# Patient Record
Sex: Male | Born: 1940
Health system: Southern US, Community
[De-identification: ages and names within clinical notes are randomized; demographics above are authoritative.]

## PROBLEM LIST (undated history)

## (undated) DIAGNOSIS — I10 Essential (primary) hypertension: Secondary | ICD-10-CM

## (undated) DIAGNOSIS — G20 Parkinson's disease: Secondary | ICD-10-CM

## (undated) DIAGNOSIS — M1990 Unspecified osteoarthritis, unspecified site: Secondary | ICD-10-CM

## (undated) DIAGNOSIS — E785 Hyperlipidemia, unspecified: Secondary | ICD-10-CM

## (undated) DIAGNOSIS — N529 Male erectile dysfunction, unspecified: Secondary | ICD-10-CM

## (undated) DIAGNOSIS — G20A1 Parkinson's disease without dyskinesia, without mention of fluctuations: Secondary | ICD-10-CM

## (undated) HISTORY — DX: Male erectile dysfunction, unspecified: N52.9

## (undated) HISTORY — DX: Hyperlipidemia, unspecified: E78.5

## (undated) HISTORY — DX: Parkinson's disease: G20

## (undated) HISTORY — DX: Parkinson's disease without dyskinesia, without mention of fluctuations: G20.A1

## (undated) HISTORY — PX: CATARACT EXTRACTION W/ INTRAOCULAR LENS IMPLANT: SHX1309

## (undated) HISTORY — DX: Unspecified osteoarthritis, unspecified site: M19.90

---

## 2004-06-10 HISTORY — PX: FLEXIBLE SIGMOIDOSCOPY: SHX1649

## 2004-06-20 ENCOUNTER — Encounter: Payer: Self-pay | Admitting: Internal Medicine

## 2005-04-04 ENCOUNTER — Ambulatory Visit: Payer: Self-pay | Admitting: Internal Medicine

## 2005-04-10 ENCOUNTER — Ambulatory Visit: Payer: Self-pay | Admitting: Internal Medicine

## 2005-04-28 ENCOUNTER — Ambulatory Visit: Payer: Self-pay | Admitting: Internal Medicine

## 2005-05-20 ENCOUNTER — Ambulatory Visit: Payer: Self-pay | Admitting: Internal Medicine

## 2006-04-30 ENCOUNTER — Ambulatory Visit: Payer: Self-pay | Admitting: Internal Medicine

## 2006-05-10 LAB — FECAL OCCULT BLOOD, GUAIAC: Fecal Occult Blood: NEGATIVE

## 2006-05-19 ENCOUNTER — Ambulatory Visit: Payer: Self-pay | Admitting: Internal Medicine

## 2007-05-17 DIAGNOSIS — F528 Other sexual dysfunction not due to a substance or known physiological condition: Secondary | ICD-10-CM | POA: Insufficient documentation

## 2007-05-17 DIAGNOSIS — M1990 Unspecified osteoarthritis, unspecified site: Secondary | ICD-10-CM | POA: Insufficient documentation

## 2007-05-17 DIAGNOSIS — E785 Hyperlipidemia, unspecified: Secondary | ICD-10-CM | POA: Insufficient documentation

## 2007-05-25 ENCOUNTER — Ambulatory Visit: Payer: Self-pay | Admitting: Internal Medicine

## 2007-05-26 LAB — CONVERTED CEMR LAB
Cholesterol: 203 mg/dL (ref 0–200)
HDL: 32.3 mg/dL — ABNORMAL LOW (ref >39.0)
Triglycerides: 151 mg/dL — ABNORMAL HIGH (ref 0–149)

## 2007-06-15 ENCOUNTER — Ambulatory Visit: Payer: Self-pay | Admitting: Internal Medicine

## 2007-06-21 ENCOUNTER — Encounter (INDEPENDENT_AMBULATORY_CARE_PROVIDER_SITE_OTHER): Payer: Self-pay | Admitting: *Deleted

## 2008-03-12 ENCOUNTER — Emergency Department: Payer: Self-pay | Admitting: Emergency Medicine

## 2008-04-28 ENCOUNTER — Ambulatory Visit: Payer: Self-pay | Admitting: Internal Medicine

## 2008-07-26 ENCOUNTER — Encounter (INDEPENDENT_AMBULATORY_CARE_PROVIDER_SITE_OTHER): Payer: Self-pay | Admitting: *Deleted

## 2008-07-26 ENCOUNTER — Ambulatory Visit: Payer: Self-pay | Admitting: Internal Medicine

## 2008-07-27 LAB — CONVERTED CEMR LAB
Glucose, Bld: 94 mg/dL (ref 70–99)
PSA: 0.42 ng/mL (ref 0.10–4.00)

## 2008-07-31 ENCOUNTER — Ambulatory Visit: Payer: Self-pay | Admitting: Internal Medicine

## 2008-08-03 LAB — CONVERTED CEMR LAB: Fecal Occult Bld: POSITIVE — AB

## 2008-08-17 ENCOUNTER — Ambulatory Visit: Payer: Self-pay | Admitting: Internal Medicine

## 2008-08-17 LAB — CONVERTED CEMR LAB
OCCULT 1: NEGATIVE
OCCULT 2: NEGATIVE
OCCULT 3: NEGATIVE

## 2008-08-18 ENCOUNTER — Telehealth: Payer: Self-pay | Admitting: Internal Medicine

## 2008-09-14 ENCOUNTER — Ambulatory Visit: Payer: Self-pay | Admitting: Gastroenterology

## 2008-09-18 ENCOUNTER — Telehealth: Payer: Self-pay | Admitting: Internal Medicine

## 2008-09-25 ENCOUNTER — Telehealth: Payer: Self-pay | Admitting: Gastroenterology

## 2008-09-27 ENCOUNTER — Ambulatory Visit: Payer: Self-pay | Admitting: Gastroenterology

## 2008-09-27 ENCOUNTER — Encounter: Payer: Self-pay | Admitting: Gastroenterology

## 2008-09-27 LAB — HM COLONOSCOPY

## 2008-10-01 ENCOUNTER — Encounter: Payer: Self-pay | Admitting: Gastroenterology

## 2009-10-15 ENCOUNTER — Ambulatory Visit: Payer: Self-pay | Admitting: Internal Medicine

## 2009-10-15 DIAGNOSIS — R011 Cardiac murmur, unspecified: Secondary | ICD-10-CM | POA: Insufficient documentation

## 2009-10-16 LAB — CONVERTED CEMR LAB
Albumin: 3.9 g/dL (ref 3.5–5.2)
Alkaline Phosphatase: 50 units/L (ref 39–117)
BUN: 16 mg/dL (ref 6–23)
Basophils Absolute: 0.1 10*3/uL (ref 0.0–0.1)
Creatinine, Ser: 1.1 mg/dL (ref 0.4–1.5)
Eosinophils Absolute: 0.5 10*3/uL (ref 0.0–0.7)
Glucose, Bld: 87 mg/dL (ref 70–99)
Hemoglobin: 14 g/dL (ref 13.0–17.0)
Lymphocytes Relative: 21.9 % (ref 12.0–46.0)
MCHC: 33.9 g/dL (ref 30.0–36.0)
Neutro Abs: 4.4 10*3/uL (ref 1.4–7.7)
Neutrophils Relative %: 65.1 % (ref 43.0–77.0)
Phosphorus: 3.9 mg/dL (ref 2.3–4.6)
Platelets: 213 10*3/uL (ref 150.0–400.0)
RDW: 11.8 % (ref 11.5–14.6)
TSH: 2.59 microintl units/mL (ref 0.35–5.50)
Total Bilirubin: 0.6 mg/dL (ref 0.3–1.2)

## 2009-10-19 ENCOUNTER — Encounter: Payer: Self-pay | Admitting: Internal Medicine

## 2010-05-17 ENCOUNTER — Ambulatory Visit: Payer: Self-pay | Admitting: Internal Medicine

## 2010-05-17 DIAGNOSIS — R0989 Other specified symptoms and signs involving the circulatory and respiratory systems: Secondary | ICD-10-CM

## 2010-05-17 DIAGNOSIS — S335XXA Sprain of ligaments of lumbar spine, initial encounter: Secondary | ICD-10-CM | POA: Insufficient documentation

## 2010-05-17 DIAGNOSIS — R0609 Other forms of dyspnea: Secondary | ICD-10-CM | POA: Insufficient documentation

## 2010-06-04 ENCOUNTER — Ambulatory Visit (HOSPITAL_COMMUNITY): Admission: RE | Admit: 2010-06-04 | Discharge: 2010-06-04 | Payer: Self-pay | Admitting: Internal Medicine

## 2010-06-04 ENCOUNTER — Ambulatory Visit: Payer: Self-pay

## 2010-06-04 ENCOUNTER — Encounter: Payer: Self-pay | Admitting: Internal Medicine

## 2010-06-04 ENCOUNTER — Ambulatory Visit: Payer: Self-pay | Admitting: Cardiovascular Disease

## 2010-10-16 ENCOUNTER — Ambulatory Visit: Payer: Self-pay | Admitting: Internal Medicine

## 2010-10-16 DIAGNOSIS — R413 Other amnesia: Secondary | ICD-10-CM | POA: Insufficient documentation

## 2010-10-17 LAB — CONVERTED CEMR LAB
Glucose, Bld: 72 mg/dL (ref 70–99)
Vitamin B-12: 496 pg/mL (ref 211–911)

## 2010-12-04 ENCOUNTER — Ambulatory Visit
Admission: RE | Admit: 2010-12-04 | Discharge: 2010-12-04 | Payer: Self-pay | Source: Home / Self Care | Attending: Internal Medicine | Admitting: Internal Medicine

## 2010-12-04 DIAGNOSIS — R42 Dizziness and giddiness: Secondary | ICD-10-CM | POA: Insufficient documentation

## 2010-12-11 NOTE — Assessment & Plan Note (Signed)
Summary: Wellness annual /JRR   Vital Signs:  Patient profile:   70 year old male Height:      68.5 inches Weight:      173 pounds BMI:     26.02 Temp:     97.8 degrees F oral Pulse rate:   68 / minute Pulse rhythm:   regular BP sitting:   138 / 80  (left arm) Cuff size:   large  Vitals Entered By: Mervin Hack CMA Duncan Dull) (October 16, 2010 9:31 AM) CC: adult physical   History of Present Illness: Reviewed paperwork for wellness exam  still some hearing problems but no change still with mild short term memory issues --which have not progressed over the past year  Occ gets some orthostatic dizziness also gets the sensation if he turns in bed Gets "heaviness feeling" in head--- it does dissapate over time No tinnitus Has been going on again for  ~3 months intermittently similar to past episode of vestibular problems  Has not really picked up on exercise does walk about 1 hour three times per week  Has only used the carisprodol occ--like after getting leaves up goes every 6 weeks to chiropractor  Still working on his scholarly work on [employer] history forgets names easier  Allergies: No Known Drug Allergies  Past History:  Past medical, surgical, family and social histories (including risk factors) reviewed for relevance to current acute and chronic problems.  Past Medical History: Reviewed history from 05/17/2007 and no changes required. Hyperlipidemia Degenerative joint disease Erectile dysfunction  Past Surgical History: Reviewed history from 07/26/2008 and no changes required. 8/05    Flex Sig.  Family History: Reviewed history from 05/17/2007 and no changes required. Dad died @74  lung cancer (smoker) Mom died @94  No CAD or HTN Pat uncle with DM  Social History: Reviewed history from 07/26/2008 and no changes required. 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 Eyes:  Denies double vision and vision loss-1 eye. ENT:  Complains of decreased hearing; denies ringing in ears; teeth okay--gets cleaning every 3 months. CV:  Complains of lightheadness and shortness of breath with exertion; denies chest pain or discomfort, difficulty breathing at night, difficulty breathing while lying down, and fainting; stable DOE. Resp:  Complains of cough; denies shortness of breath; occ cough. GI:  Denies abdominal pain, bloody stools, change in bowel habits, dark tarry stools, indigestion, nausea, and vomiting. GU:  Complains of erectile dysfunction, nocturia, urinary frequency, and urinary hesitancy; denies incontinence; viagra not as effective lately. MS:  Complains of low back pain and muscle aches; denies joint pain and joint swelling; occ low back pain and spasm does have some stiffness in fingers. Derm:  Complains of dryness; denies lesion(s) and rash. Neuro:  Complains of tremors; denies headaches, numbness, tingling, and weakness; fingers don't work as well as in past--stiffness (with typing for example). Psych:  Denies anxiety and depression. Endo:  Denies excessive thirst. Heme:  Denies abnormal bruising and enlarge lymph nodes. Allergy:  Complains of sneezing; may have cat and environmental symptoms.  Physical Exam  General:  alert and normal appearance.   Eyes:  pupils equal, pupils round, and pupils reactive to light.   Ears:  R ear normal and L ear normal.  Mouth:  no erythema, no exudates, and no lesions.   Neck:  supple, no masses, no thyromegaly, no carotid bruits, and no cervical lymphadenopathy.   Lungs:  normal respiratory effort, no intercostal retractions, no accessory muscle use, and normal breath sounds.   Heart:  normal rate, regular  rhythm, no murmur, and no gallop.   Abdomen:  soft, non-tender, and no masses.   Prostate:  deferred after discussion Msk:  no joint tenderness and no joint swelling.   Pulses:  2+ in feet Extremities:  no edema Neurologic:  alert & oriented X3, strength normal in all extremities, and gait normal.   Skin:  no rashes and no suspicious lesions.   Axillary Nodes:  No palpable lymphadenopathy Psych:  normally interactive, good eye contact, not anxious appearing, and not depressed appearing.     Impression & Recommendations:  Problem # 1:  HEALTH MAINTENANCE EXAM (ICD-V70.0) Assessment Comment Only seems healthy discussed fitness will check PSA till 106 after discussion  Problem # 2:  MEMORY LOSS (ICD-780.93) Assessment: Comment Only  not progressive doesn't seem to be early dementia will just check B12  Orders: Venipuncture (04540) TLB-Glucose, QUANT (82947-GLU) TLB-B12, Serum-Total ONLY (98119-J47)  Complete Medication List: 1)  Triamcinolone Acetonide 0.1 % Crea (Triamcinolone acetonide) .... Apply twice a day  as needed for itchy areas 2)  Carisoprodol 350 Mg Tabs (Carisoprodol) .... 1/2 -1 tab by mouth three times a day as needed for back spasm 3)  Viagra 100 Mg Tabs (Sildenafil citrate) .... 1/2 tab as needed 4)  Adult Aspirin Ec Low Strength 81 Mg Tbec (Aspirin) .Marland Kitchen.. 1 by mouth daily 5)  One-daily Multivitamins Tabs (Multiple vitamin) .... Once daily 6)  Glucosamine-chondroitin 1500-1200 Mg/51ml Liqd (Glucosamine-chondroitin) .... Once daily 7)  Docusate Sodium 100 Mg Caps (Docusate sodium) .... Use daily as needed  Other Orders: TLB-PSA (Prostate Specific Antigen) (84153-PSA)  Patient Instructions: 1)  Please schedule a follow-up appointment in 1 year.  Prescriptions: CARISOPRODOL 350 MG TABS (CARISOPRODOL) 1/2 -1 tab by mouth three times a day as needed for back spasm  #30 x 1   Entered by:   Mervin Hack CMA (AAMA)   Authorized by:   Cindee Salt MD    Signed by:   Mervin Hack CMA (AAMA) on 10/16/2010   Method used:   Electronically to        [facility]. [address] [phone]* (retail)       [address] [place], [place]  [address]       Ph: [phone]       Fax: [phone]   RxID:   [id number] TRIAMCINOLONE ACETONIDE 0.1 %  CREA (TRIAMCINOLONE ACETONIDE) apply twice a day  as needed for itchy areas  #30 x 6   Entered by:   Mervin Hack CMA (AAMA)   Authorized by:   Cindee Salt MD   Signed by:   Mervin Hack CMA (AAMA) on 10/16/2010   Method used:   Electronically to        [facility]. [address] [phone]* (retail)       [address] [place], [place]  [address]       Ph: [phone]       Fax: [phone]   RxID:   [id number] VIAGRA 100 MG  TABS (SILDENAFIL CITRATE) 1/2 tab as needed  #10 x 12   Entered by:   Mervin Hack CMA (AAMA)  Authorized by:   Cindee Salt MD   Signed by:   Mervin Hack CMA (AAMA) on 10/16/2010   Method used:   Electronically to        [facility]. [address] [phone]* (retail)       [address] [place], [place]  [address]       Ph: [phone]       Fax: [phone]   RxID:   [id number]    Orders Added: 1)  Est. Patient 65& > [99397] 2)  TLB-PSA (Prostate Specific Antigen) [84153-PSA] 3)  Venipuncture [42595] 4)  TLB-Glucose, QUANT [82947-GLU] 5)  TLB-B12, Serum-Total ONLY [82607-B12]    Current Allergies (reviewed today): No known allergies

## 2010-12-11 NOTE — Assessment & Plan Note (Signed)
Summary: back pain/alc   Vital Signs:  Patient profile:   70 year old male Weight:      171 pounds Temp:     98.5 degrees F oral Pulse rate:   76 / minute Pulse rhythm:   regular Resp:     14 per minute BP sitting:   140 / 94  (left arm) Cuff size:   regular  Vitals Entered By: Lowella Petties CMA (May 17, 2010 11:42 AM) CC: Right mid to lower back pain   History of Present Illness: Having reoccurrence of back problem that hasn't bothered him in some time Ruptured disc in past--20 years ago had PT then for spasm etc will have periodic pain since then  wife's carisprodol usually helps  really worsened yesterday mild symptoms for about 1 week--just like his usual soreness after bad bed at scoup camp and then using weed eater had to stay in bed last night took one of wife's carisprodol--seemed to ease things up  has had some shooting pains down left leg only yesterday and intermittent no weakness  has tried some tylenol but carisprodol works better  Ongoing DOE worsened lately no chest pain, arm or neck pain but gets dyspneic going up steps or doing yard work no palpitations  Allergies: No Known Drug Allergies  Past History:  Past medical, surgical, family and social histories (including risk factors) reviewed for relevance to current acute and chronic problems.  Past Medical History: Reviewed history from 05/17/2007 and no changes required. Hyperlipidemia Degenerative joint disease Erectile dysfunction  Past Surgical History: Reviewed history from 07/26/2008 and no changes required. 8/05    Flex Sig.  Family History: Reviewed history from 05/17/2007 and no changes required. Dad died @74  lung cancer (smoker) Mom died @94  No CAD or HTN Pat uncle with DM  Social History: Reviewed history from 07/26/2008 and no changes required. 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   Neck:  supple, no masses, no thyromegaly, no carotid bruits, and no cervical lymphadenopathy.   Lungs:  normal respiratory effort, no intercostal retractions, no accessory muscle use, normal breath sounds, no crackles, and no wheezes.   Heart:  normal rate, regular rhythm, no murmur, and no gallop.   Msk:  no spine tenderness hip flexors are stiff but SLR negative limited back flexion mild spasm and tenderness in lateral left lumbar area Extremities:  no edema Neurologic:  strength normal in all extremities, gait normal, and DTRs symmetrical and normal.   Additional Exam:  spirometry normal unable to get EKG--technical problem with machine   Impression & Recommendations:  Problem # 1:  LUMBAR SPRAIN AND STRAIN (ICD-847.2) Assessment New seems muscular will go ahead and give him carisprodol for his use  Problem # 2:  DYSPNEA/SHORTNESS OF BREATH (ICD-786.09) Assessment: New  goes back a year but now more pressing will go ahead and set up stress echo since coronary ischemia is a definite possiblity  Orders: Spirometry w/Graph (94010) Echo Referral (Echo)  Complete Medication List: 1)  Viagra 100  Mg Tabs (Sildenafil citrate) .... 1/2 tab as needed 2)  Adult Aspirin Ec Low Strength 81 Mg Tbec (Aspirin) .Marland Kitchen.. 1 by mouth daily 3)  One-daily Multivitamins Tabs (Multiple vitamin) .... Once daily 4)  Glucosamine-chondroitin 1500-1200 Mg/12ml Liqd (Glucosamine-chondroitin) .... Once daily 5)  Triamcinolone Acetonide 0.1 % Crea (Triamcinolone acetonide) .... Apply twice a day  as needed for itchy areas 6)   Docusate Sodium 100 Mg Caps (Docusate sodium) .... Use daily as needed 7)  Carisoprodol 350 Mg Tabs (Carisoprodol) .... 1/2 -1 tab by mouth three times a day as needed for back spasm  Patient Instructions: 1)  Please schedule physical in about 6 months 2)  Please set up stress echocardiogram Prescriptions: CARISOPRODOL 350 MG TABS (CARISOPRODOL) 1/2 -1 tab by mouth three times a day as needed for back spasm  #30 x 1   Entered and Authorized by:   Cindee Salt MD   Signed by:   Cindee Salt MD on 05/17/2010   Method used:   Electronically to        [facility] [address] [phone]* (retail)       [address] [place], [place]  [address]       Ph: [phone]       Fax: [phone]   RxID:   [id number]   Prior Medications (reviewed today): VIAGRA 100 MG  TABS (SILDENAFIL CITRATE) 1/2 tab as needed ADULT ASPIRIN EC LOW STRENGTH 81 MG  TBEC (ASPIRIN) 1 by mouth daily ONE-DAILY MULTIVITAMINS   TABS (MULTIPLE VITAMIN) once daily GLUCOSAMINE-CHONDROITIN 1500-1200 MG/30ML  LIQD (GLUCOSAMINE-CHONDROITIN) once daily TRIAMCINOLONE ACETONIDE 0.1 %  CREA (TRIAMCINOLONE ACETONIDE) apply twice a day  as needed for itchy areas DOCUSATE SODIUM 100 MG CAPS (DOCUSATE SODIUM) Use daily as needed Current Allergies: No known allergies

## 2010-12-11 NOTE — Letter (Signed)
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

## 2010-12-12 NOTE — Assessment & Plan Note (Signed)
Summary: dizzy/alc   Vital Signs:  Patient profile:   70 year old male Weight:      173 pounds Temp:     98.5 degrees F oral Pulse rate:   69 / minute Pulse rhythm:   regular BP sitting:   171 / 98  (left arm) Cuff size:   large  Vitals Entered By: Mervin Hack CMA Duncan Dull) (December 04, 2010 12:14 PM) CC: dizzy   History of Present Illness: Has had intermittent dizziness that goes back for some time Tried the loratadine since then Dizziness is not worse but is more consistent Has sensation of "swimmy headedness" No headache  Hands feel cold also---this goes back also Notes it when typing  not sure if this is related  Continues to do his regular exercise No problems with this  Less dizziness if lying in bed Does notice it if he turns over --even in bed Sensation occurs immediately upon standing quickly--not delayed  No sensation of movemen No sig nausea Lasting longer now--fairly persistent feeling of being "off"  Uses very little salt  Allergies: No Known Drug Allergies  Past History:  Past medical, surgical, family and social histories (including risk factors) reviewed for relevance to current acute and chronic problems.  Past Medical History: Reviewed history from 05/17/2007 and no changes required. Hyperlipidemia Degenerative joint disease Erectile dysfunction  Past Surgical History: Reviewed history from 07/26/2008 and no changes required. 8/05    Flex Sig.  Family History: Reviewed history from 05/17/2007 and no changes required. Dad died @74  lung cancer (smoker) Mom died @94  No CAD or HTN Pat uncle with DM  Social History: Reviewed history from 07/26/2008 and no changes required. 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   Eyes:  pupils equal, pupils round, pupils reactive to light, and no nystagmus.   Mouth:  no erythema, no exudates, and no lesions.   Neck:  supple, no masses, and no cervical lymphadenopathy.   Neurologic:  alert & oriented X3, cranial nerves II-XII intact, strength normal in all extremities, gait normal, finger-to-nose normal, and Romberg negative.   Psych:  normally interactive, good eye contact, not anxious appearing, and not depressed appearing.     Impression & Recommendations:  Problem # 1:  DIZZINESS (ICD-780.4) Assessment New seems to be vestibular has had some past problems but this is more persistent--though mild Did get worse with rapid turn when walking  will try meclizine regularly wean when symptoms are better after 2 weeks Call if persists  Complete Medication List: 1)  Triamcinolone Acetonide 0.1 % Crea (Triamcinolone acetonide) .... Apply twice a day  as needed for itchy areas 2)  Carisoprodol 350 Mg Tabs (Carisoprodol) .... 1/2 -1 tab by mouth three times a day as needed for back spasm 3)  Viagra 100 Mg Tabs (Sildenafil citrate) .... 1/2 tab as needed 4)  Adult Aspirin Ec Low Strength 81 Mg Tbec (Aspirin) .Marland Kitchen.. 1 by mouth daily 5)  One-daily Multivitamins Tabs (Multiple vitamin) .... Once daily 6)  Glucosamine-chondroitin 1500-1200 Mg/66ml Liqd (Glucosamine-chondroitin) .... Once daily 7)  Docusate Sodium 100 Mg Caps (  Docusate sodium) .... Use daily as needed 8)  Claritin 10 Mg Tabs (Loratadine) .... Take 1 by mouth once daily as needed 9)  Meclizine Hcl 25 Mg Tabs (Meclizine hcl) .Marland Kitchen.. 1 tab by mouth three times a day for dizziness  Patient Instructions: 1)  Please take the meclizine 25mg  three times a day. If your dizziness resolves, try weaning off after about 2 weeks 2)   Call if your symptoms persist--even with the medication Prescriptions: MECLIZINE HCL 25 MG TABS (MECLIZINE HCL) 1 tab by mouth three times a day for dizziness  #90 x 5   Entered and Authorized by:   Cindee Salt MD   Signed by:   Cindee Salt MD on 12/04/2010   Method used:   Electronically to        [facility] [address] [phone]* (retail)       [address] [place], [place]  [address]       Ph: [phone]       Fax: [phone]   RxID:   [id number]    Orders Added: 1)  Est. Patient Level IV [32355]    Current Allergies (reviewed today): No known allergies

## 2011-04-14 ENCOUNTER — Telehealth: Payer: Self-pay | Admitting: *Deleted

## 2011-04-14 NOTE — Telephone Encounter (Signed)
Pt was seen in January for dizziness, he is still having the dizziness and asks what to do next. Still taking meclizine.  He has appt to follow up with you on 6/11, but he is asking if he should be referred somewhere else first. Please advise.

## 2011-04-14 NOTE — Telephone Encounter (Signed)
Depending on how bad it is, we may want to consider referral to ENT or neurologist Probably best if we talk about it next week at his visit

## 2011-04-16 NOTE — Telephone Encounter (Signed)
Spoke with patient and advised results, pt will discuss at OV.

## 2011-04-17 ENCOUNTER — Ambulatory Visit (INDEPENDENT_AMBULATORY_CARE_PROVIDER_SITE_OTHER): Payer: MEDICARE | Admitting: Internal Medicine

## 2011-04-17 ENCOUNTER — Encounter: Payer: Self-pay | Admitting: Internal Medicine

## 2011-04-17 VITALS — BP 158/80 | HR 79 | Temp 99.0°F | Ht 68.5 in | Wt 170.0 lb

## 2011-04-17 DIAGNOSIS — R413 Other amnesia: Secondary | ICD-10-CM

## 2011-04-17 LAB — BASIC METABOLIC PANEL
BUN: 15 mg/dL (ref 6–23)
Calcium: 8.4 mg/dL (ref 8.4–10.5)
Chloride: 91 mEq/L — ABNORMAL LOW (ref 96–112)
Creatinine, Ser: 1.1 mg/dL (ref 0.4–1.5)
GFR: 73.37 mL/min (ref >60.00)

## 2011-04-17 LAB — CBC WITH DIFFERENTIAL/PLATELET
Basophils Absolute: 0 K/uL (ref 0.0–0.1)
Basophils Relative: 0.5 % (ref 0.0–3.0)
Eosinophils Absolute: 0.2 K/uL (ref 0.0–0.7)
Eosinophils Relative: 2.5 % (ref 0.0–5.0)
HCT: 41.2 % (ref 39.0–52.0)
Hemoglobin: 14.4 g/dL (ref 13.0–17.0)
Lymphocytes Relative: 12.8 % (ref 12.0–46.0)
Lymphs Abs: 0.9 K/uL (ref 0.7–4.0)
MCHC: 34.8 g/dL (ref 30.0–36.0)
MCV: 96.7 fl (ref 78.0–100.0)
Monocytes Absolute: 0.4 K/uL (ref 0.1–1.0)
Monocytes Relative: 6.2 % (ref 3.0–12.0)
Neutro Abs: 5.6 K/uL (ref 1.4–7.7)
Neutrophils Relative %: 78 % — ABNORMAL HIGH (ref 43.0–77.0)
Platelets: 226 K/uL (ref 150.0–400.0)
RBC: 4.27 Mil/uL (ref 4.22–5.81)
RDW: 12.5 % (ref 11.5–14.6)
WBC: 7.2 K/uL (ref 4.5–10.5)

## 2011-04-17 LAB — VITAMIN B12: Vitamin B-12: 526 pg/mL (ref 211–911)

## 2011-04-17 LAB — SEDIMENTATION RATE: Sed Rate: 21 mm/hr (ref 0–22)

## 2011-04-17 LAB — TSH: TSH: 2.19 u[IU]/mL (ref 0.35–5.50)

## 2011-04-17 LAB — HEPATIC FUNCTION PANEL
Albumin: 4.1 g/dL (ref 3.5–5.2)
Total Bilirubin: 0.6 mg/dL (ref 0.3–1.2)

## 2011-04-17 NOTE — Progress Notes (Signed)
  Subjective:    Patient ID: Johnny Barnett, male    DOB: May 10, 1941, 70 y.o.   MRN: [id number]  HPI Feels his dizziness is about the same but it persists Not a problem while lying in bed or rolling over Notices more when he is up on his feet for a while---though better after a good walk It is "tiring" to him. Feels his stamina is not as good No nausea but somewhat uncertain on feet--holds bannister going down steps May be related to trifocals "almost a heaviness in my head"  No falls but almost feels like he close to it at times No true vertigo No tinnitus No hearing loss ??slight arm weakness---no change in activities  Has noted mild progression of short term memory loss  Meclizine really hasn't helped  No current outpatient prescriptions on file prior to visit.   Past Medical History  Diagnosis Date  . Hyperlipidemia   . ED (erectile dysfunction)   . Degenerative joint disease     Past Surgical History  Procedure Date  . Flexible sigmoidoscopy 08/05    Family History  Problem Relation Age of Onset  . Diabetes Paternal Uncle   . Coronary artery disease Neg Hx   . Hypertension Neg Hx     History   Social History  . Marital Status: Married    Spouse Name: N/A    Number of Children: 2  . Years of Education: N/A   Occupational History  . 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 Smoking status: Former Games developer  . Smokeless tobacco: Never Used  . Alcohol Use: Yes     wine  . Drug Use: No  . Sexually Active: Not on file   Other Topics Concern  . 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 Rare incontinence       Objective:   Physical Exam  Constitutional: He is oriented to person, place, and time. He appears well-developed and well-nourished. No distress.  HENT:  Head: Normocephalic and atraumatic.  Mouth/Throat: Oropharynx is clear and moist. No oropharyngeal exudate.       TMs normal  Eyes: Conjunctivae and EOM are normal. Pupils are equal, round, and reactive to light.       No nystagmus No papilledema  Neck: Normal range of motion. Neck supple.  Lymphadenopathy:    He has no cervical adenopathy.  Neurological: He is alert and oriented to person, place, and time. He has normal strength. He displays no tremor. No cranial nerve deficit. He exhibits normal muscle tone. He displays a negative Romberg sign. Coordination and gait normal.       Seems just slightly unstable with walking  Psychiatric: He has a normal mood and affect. His behavior is normal. Judgment and thought content normal.          Assessment & Plan:

## 2011-04-17 NOTE — Patient Instructions (Signed)
Please set up MRI of brain Please stop the meclizine

## 2011-04-17 NOTE — Assessment & Plan Note (Signed)
Speech seems more slowed now He has had mild progression of symptoms Also with some instability and occ urinary incontinence Need to consider normal pressure hydrocephalus  Also stroke is possibility as well Will check labs and MRI of head

## 2011-04-21 ENCOUNTER — Ambulatory Visit: Payer: Self-pay | Admitting: Internal Medicine

## 2011-04-24 ENCOUNTER — Ambulatory Visit: Payer: Self-pay | Admitting: Internal Medicine

## 2011-04-25 ENCOUNTER — Encounter: Payer: Self-pay | Admitting: Internal Medicine

## 2011-04-29 ENCOUNTER — Telehealth: Payer: Self-pay | Admitting: *Deleted

## 2011-04-29 NOTE — Telephone Encounter (Signed)
Pt is asking if you have seen the results of the EKG that he had at [facility] on 6/14.  They were to have sent this to you.  I dont see it in the chart.

## 2011-04-29 NOTE — Telephone Encounter (Signed)
Please try to track it down

## 2011-04-30 NOTE — Telephone Encounter (Signed)
Faxed records release form to [facility] medical records requesting EKG report.

## 2011-05-02 ENCOUNTER — Telehealth: Payer: Self-pay | Admitting: *Deleted

## 2011-05-02 NOTE — Telephone Encounter (Signed)
Spoke with patient and he didn't have a EKG, he had a MRI, sent for records.

## 2011-05-02 NOTE — Telephone Encounter (Signed)
Patient says that he had an EKG done at [facility] last week and was told that they would be sending results. He is asking if you have seen the report and if so what is showed. He is asking that he be called back today with results. I also sent request to [facility] in case we did not already receive it.

## 2011-05-02 NOTE — Telephone Encounter (Signed)
Sent for records again, Johnny Barnett has sent and so have I, I'm not sure if it was sent or not, will have it faxed to my fax machine.

## 2011-05-02 NOTE — Telephone Encounter (Signed)
I don't see it in chart or remember seeing it I will notify him after I review it---try to track it down

## 2011-05-02 NOTE — Telephone Encounter (Signed)
MRI results on your desk.

## 2011-05-05 ENCOUNTER — Encounter: Payer: Self-pay | Admitting: Internal Medicine

## 2011-05-05 NOTE — Telephone Encounter (Signed)
Spoke with him and discussed benign findings Still concerned about his dizziness Will set up follow up in 2-3 months to review  Is on ASA daily

## 2011-05-06 ENCOUNTER — Telehealth: Payer: Self-pay | Admitting: *Deleted

## 2011-05-06 DIAGNOSIS — R413 Other amnesia: Secondary | ICD-10-CM

## 2011-05-06 DIAGNOSIS — R42 Dizziness and giddiness: Secondary | ICD-10-CM

## 2011-05-06 NOTE — Telephone Encounter (Signed)
Pt's wife is asking that you call her regarding pt's appt yesterday.  She has some concerns.

## 2011-05-07 NOTE — Telephone Encounter (Signed)
Wants further eval for persistent dizziness Decided neurologist would be best option Wants [facility]

## 2011-05-07 NOTE — Telephone Encounter (Signed)
Please let wife know that [facility] neurologist won't be seeing patients for probably 2 months Will proceed with referral to [facility] neuro or can try [facility] if it takes too long at [facility]

## 2011-05-08 NOTE — Telephone Encounter (Signed)
Thanks for your help.

## 2011-05-08 NOTE — Telephone Encounter (Signed)
I have talked w/ Johnny and Johnny Barnett regarding the Neurology referral, I have faxed the information, for the Neurologist to review the chart, we are now  waiting on an appointment..cdavis 05-08-2011

## 2011-05-16 ENCOUNTER — Telehealth: Payer: Self-pay | Admitting: *Deleted

## 2011-05-16 DIAGNOSIS — R0600 Dyspnea, unspecified: Secondary | ICD-10-CM

## 2011-05-16 NOTE — Telephone Encounter (Signed)
Received message from patient's wife asking if you could call her at your convenience. She wants to talk with you about possibly referring patient to a cardiologist. After I got the message off of Triage Machine I called wife to make sure there was nothing urgent that needed to be addressed before Monday. She said that it wasn't urgent and that she realized you were probably already gone for the day when she called. I told her It would most likely be Monday before hearing from you.

## 2011-05-16 NOTE — Telephone Encounter (Signed)
Spoke w/Pt about Neurology referral. Reiterated the process as has been done in referral note previously. Pt will contact [facility] for status on Appointment, as he is anxious to get in for consultation.

## 2011-05-19 NOTE — Telephone Encounter (Signed)
Appt made with Cards Dr Mariah Milling on 05/29/2011 at 2pm. [facility]

## 2011-05-19 NOTE — Telephone Encounter (Signed)
Awaiting the neurology appt which is at the end of the month He and wife feel cardiology eval may also be warranted for the dizziness He has now noticed easy DOE! Will set up with Dr Mariah Milling

## 2011-05-21 ENCOUNTER — Ambulatory Visit: Payer: MEDICARE | Admitting: Internal Medicine

## 2011-05-23 ENCOUNTER — Encounter: Payer: Self-pay | Admitting: Cardiovascular Disease

## 2011-05-29 ENCOUNTER — Ambulatory Visit: Payer: MEDICARE | Admitting: Cardiovascular Disease

## 2011-05-30 ENCOUNTER — Ambulatory Visit (INDEPENDENT_AMBULATORY_CARE_PROVIDER_SITE_OTHER): Payer: MEDICARE | Admitting: Cardiovascular Disease

## 2011-05-30 ENCOUNTER — Encounter: Payer: Self-pay | Admitting: Cardiovascular Disease

## 2011-05-30 VITALS — BP 172/95 | HR 77 | Ht 68.0 in | Wt 165.0 lb

## 2011-05-30 DIAGNOSIS — R42 Dizziness and giddiness: Secondary | ICD-10-CM

## 2011-05-30 DIAGNOSIS — I10 Essential (primary) hypertension: Secondary | ICD-10-CM

## 2011-05-30 DIAGNOSIS — R0609 Other forms of dyspnea: Secondary | ICD-10-CM

## 2011-05-30 DIAGNOSIS — R0602 Shortness of breath: Secondary | ICD-10-CM

## 2011-05-30 DIAGNOSIS — R0989 Other specified symptoms and signs involving the circulatory and respiratory systems: Secondary | ICD-10-CM

## 2011-05-30 DIAGNOSIS — R413 Other amnesia: Secondary | ICD-10-CM

## 2011-05-30 NOTE — Assessment & Plan Note (Signed)
He will monitor his BP at home. He was elevated in January and elevated on today's visit. Uncertain what his blood pressure is at home.

## 2011-05-30 NOTE — Assessment & Plan Note (Signed)
Dizziness does not appear to be cardiac related. He is hypertensive and shows no significant signs of orthostasis. No underlying arrhythmia. He does appreciate the dizziness in the office today. I've asked him to monitor his blood pressure at home for [place] given his elevated numbers.

## 2011-05-30 NOTE — Progress Notes (Signed)
Patient ID: ELSIE SAKUMA, male    DOB: June 10, 1941, 70 y.o.   MRN: [id number]  HPI Comments: Mr. Sleight is a very pleasant 69 year old gentleman with no known cardiac history who presents by referral from Dr. Alphonsus Sias for evaluation of lightheadedness.  Mr. Dowdy reports that he has had worsening dizziness over the past 6 months. He had a long trial of meclizine with no improvement. His had a subsequent MRI that has shown chronic small vessel disease concerning for ischemia with no acute ischemia noted. He has followup with neurology at the end of this month in [place].  He has shortness of breath with exertion, his weight has dropped 10 pounds over the past year. He has worsening finger dexterity, short-term memory loss, he feels stiff and he states having more of a "shuffle gait". He describes his dizziness as a lightheadedness and not a spinning. In the exam room today, he has dizziness. It feels better lying down, mild symptoms with sitting, worsening symptoms with standing.  EKG shows normal sinus rhythm with rate 75 beats per minute with no significant ST or T wave changes   Outpatient Encounter Prescriptions as of 05/30/2011  Medication Sig Dispense Refill  . aspirin 81 MG EC tablet Take 81 mg by mouth daily.        . carisoprodol (SOMA) 350 MG tablet Take 350 mg by mouth 4 (four) times daily as needed.        . docusate sodium (COLACE) 100 MG capsule Take 100 mg by mouth daily.        . Glucosamine-Chondroit-Vit C-Mn (GLUCOSAMINE CHONDR 1500 COMPLX) CAPS Take by mouth daily.        Marland Kitchen loratadine (CLARITIN) 10 MG tablet Take 10 mg by mouth daily.        . Multiple Vitamin (MULTIVITAMIN) tablet Take 1 tablet by mouth daily.        . sildenafil (VIAGRA) 100 MG tablet Take 50 mg by mouth daily as needed.        . triamcinolone (KENALOG) 0.1 % cream Apply topically 2 (two) times daily.           Review of Systems  Constitutional: Negative.   HENT: Negative.   Eyes: Negative.     Respiratory: Negative.   Cardiovascular: Negative.   Gastrointestinal: Negative.   Musculoskeletal: Positive for gait problem.  Skin: Negative.   Neurological: Positive for dizziness and light-headedness.       Short-term memory deficit. Problems with finger dexterity. Pain in his arms, tingling and coldness of his hands.  Hematological: Negative.   Psychiatric/Behavioral: Negative.   All other systems reviewed and are negative.    BP 172/95  Pulse 77  Ht 5\' 8"  (1.727 m)  Wt 165 lb (74.844 kg)  BMI 25.09 kg/m2  SpO2 97% Repeat blood pressure 160/90 on the left  Physical Exam  Nursing note and vitals reviewed. Constitutional: He is oriented to person, place, and time. He appears well-developed and well-nourished.  HENT:  Head: Normocephalic.  Nose: Nose normal.  Mouth/Throat: Oropharynx is clear and moist.  Eyes: Conjunctivae are normal. Pupils are equal, round, and reactive to light.  Neck: Normal range of motion. Neck supple. No JVD present.  Cardiovascular: Normal rate, regular rhythm, S1 normal, S2 normal, normal heart sounds and intact distal pulses.  Exam reveals no gallop and no friction rub.   No murmur heard. Pulmonary/Chest: Effort normal and breath sounds normal. No respiratory distress. He has no wheezes. He has no rales.  He exhibits no tenderness.  Abdominal: Soft. Bowel sounds are normal. He exhibits no distension. There is no tenderness.  Musculoskeletal: Normal range of motion. He exhibits no edema and no tenderness.  Lymphadenopathy:    He has no cervical adenopathy.  Neurological: He is alert and oriented to person, place, and time. Coordination normal.  Skin: Skin is warm and dry. No rash noted. No erythema.  Psychiatric: He has a normal mood and affect. His behavior is normal. Judgment and thought content normal.           Assessment and Plan

## 2011-05-30 NOTE — Assessment & Plan Note (Signed)
I am concerned about his short-term memory loss. He also has other symptoms such as weight loss, loss of finger dexterity, shuffle gait, miscellaneous aches and pains in his arms. I'm concerned he could have an early stage cognitive process such as Lewy Body disease. He is scheduled to see neurology 06/09/11.

## 2011-05-30 NOTE — Assessment & Plan Note (Signed)
Etiology of his shortness of breath is uncertain. No signs of heart failure, no signs of valve disease on clinical exam. I'm concerned about his hypertension which could contribute to his symptoms. He will monitor his blood pressure.

## 2011-05-30 NOTE — Patient Instructions (Signed)
You are doing well. No medication changes were made. Please monitor your blood pressure and show these to Dr. Alphonsus Sias Please call us if you have new issues that need to be addressed

## 2011-06-09 ENCOUNTER — Telehealth: Payer: Self-pay | Admitting: *Deleted

## 2011-06-09 NOTE — Telephone Encounter (Signed)
Patient was told to call with BP readings. On the 21st it was 142/86. On the 22nd it was 132/78 and then several hrs later it was 155/95. ON the 23rd it was 137/87. On the 24th it was 144/86 and several hrs later it was 160/96. On the 25th it was 143/87. On the 26th it was 131/76 and then later it was 143/87 on the 27th it was 120/72 and then it was 142/82. On the 28th it was 148/83, 163/101, 157/91, and on the 29th it was 139/78, 124/70, and then 141/82. Wife also wanted says that he was diagnosed with early parkisons disease this morning at [facility] neurology.

## 2011-06-10 NOTE — Telephone Encounter (Signed)
Please let him know, or wife, that these blood pressures are okay for the time being I am glad that he has an answer for what is going on. Parkinson's can be serious but is often slow to progress for a number of years

## 2011-06-11 NOTE — Telephone Encounter (Signed)
Patient's wife advised

## 2011-07-04 ENCOUNTER — Encounter: Payer: Self-pay | Admitting: Internal Medicine

## 2011-07-07 ENCOUNTER — Ambulatory Visit (INDEPENDENT_AMBULATORY_CARE_PROVIDER_SITE_OTHER): Payer: MEDICARE | Admitting: Internal Medicine

## 2011-07-07 ENCOUNTER — Encounter: Payer: Self-pay | Admitting: Internal Medicine

## 2011-07-07 DIAGNOSIS — G20A1 Parkinson's disease without dyskinesia, without mention of fluctuations: Secondary | ICD-10-CM

## 2011-07-07 DIAGNOSIS — R42 Dizziness and giddiness: Secondary | ICD-10-CM

## 2011-07-07 DIAGNOSIS — G20 Parkinson's disease: Secondary | ICD-10-CM

## 2011-07-07 DIAGNOSIS — I10 Essential (primary) hypertension: Secondary | ICD-10-CM

## 2011-07-07 MED ORDER — BISOPROLOL-HYDROCHLOROTHIAZIDE 2.5-6.25 MG PO TABS: 1.0000 | ORAL_TABLET | Freq: Every day | ORAL | Status: DC

## 2011-07-07 NOTE — Assessment & Plan Note (Signed)
BP Readings from Last 3 Encounters:  07/07/11 172/84  05/30/11 172/95  04/17/11 158/80   Has been very variable ??related to his dizziness Will try low dose ziac Recheck soon

## 2011-07-07 NOTE — Progress Notes (Signed)
  Subjective:    Patient ID: Johnny Barnett, male    DOB: [birth year], 70 y.o.   MRN: [id number]  HPI Recent diagnosis of Parkinson's Now in ropinrole---he feels his symptoms vary from hour to hour Dizziness has persisted--even sitting. Did feel better after walking  Has had some constipation---doing okay with laxative (colace and occ mixed with laxative)  Notes loss of dexterity in fingers  Also with cold feeling  BP has been erratic 107/61--167/101 Most systolics are under 150 as well  Chest congestion--persists even after coughing Has sense of something there Doesn't really feel feverish or sick  Current Outpatient Prescriptions on File Prior to Visit  Medication Sig Dispense Refill  . aspirin 81 MG EC tablet Take 81 mg by mouth daily.        Marland Kitchen docusate sodium (COLACE) 100 MG capsule Take 100 mg by mouth daily.        . Glucosamine-Chondroit-Vit C-Mn (GLUCOSAMINE CHONDR 1500 COMPLX) CAPS Take by mouth daily.        Marland Kitchen loratadine (CLARITIN) 10 MG tablet Take 10 mg by mouth daily.        . Multiple Vitamin (MULTIVITAMIN) tablet Take 1 tablet by mouth daily.        Marland Kitchen triamcinolone (KENALOG) 0.1 % cream Apply topically 2 (two) times daily.          No Known Allergies  Past Medical History  Diagnosis Date  . Hyperlipidemia   . ED (erectile dysfunction)   . Degenerative joint disease     Past Surgical History  Procedure Date  . Flexible sigmoidoscopy 08/05    Family History  Problem Relation Age of Onset  . Diabetes Paternal Uncle   . Coronary artery disease Neg Hx   . Hypertension Neg Hx     History   Social History  . Marital Status: Married    Spouse Name: N/A    Number of Children: 2  . Years of Education: N/A   Occupational History  . 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 Smoking status: Never Smoker   . Smokeless tobacco: Never Used  . Alcohol Use: Yes     wine  . Drug Use: No  . Sexually Active: Not  on file   Other Topics Concern  . 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 Never a great sleeper. Some trouble getting going in the morning Appetite isn't great--has lost some weight    Objective:   Physical Exam  Constitutional: He appears well-developed and well-nourished. No distress.  Neck: Normal range of motion. Neck supple.  Cardiovascular: Normal rate, regular rhythm and normal heart sounds.  Exam reveals no gallop.   No murmur heard. Pulmonary/Chest: Effort normal and breath sounds normal. No respiratory distress. He has no wheezes. He has no rales.  Lymphadenopathy:    He has no cervical adenopathy.  Neurological:       Mild masked facies Bradykinesia as well Mild stiffness  Psychiatric: His behavior is normal. Judgment normal.          Assessment & Plan:

## 2011-07-07 NOTE — Assessment & Plan Note (Signed)
Clear cut Probably the reason for his dizziness also Not much response but is titrating up meds Discussed that he may need levadopa but discussed concerns about dyskinesias in future

## 2011-07-07 NOTE — Patient Instructions (Signed)
Please try the new blood pressure med--bisoprolol/HCTZ Call if you have any problems with this

## 2011-07-07 NOTE — Assessment & Plan Note (Signed)
perists Not orthostatic ??altered position sense (but occurs with sitting also) Advised to continue with walking

## 2011-07-08 ENCOUNTER — Telehealth: Payer: Self-pay | Admitting: *Deleted

## 2011-07-08 NOTE — Telephone Encounter (Signed)
Patient called to advise that he is feeling worse this morning than he did yesterday when he saw you. Patient stated that he is dizzy and feeling a little nauseated, but has not had anything to eat this morning. Patient stated that his BP is 150/91 this morning. Patient stated that you prescribed him BP medication because his BP may be causing his dizziness. Patient got the rx filled yesterday, but has not taken it yet.  Patient was advised to go ahead and take the medication and to eat something which may help him to feel better. Patient is wondering if medication given to him by his neurologist could be causing his symptoms? Patient will call back with an update on how he is feeling later this morning.

## 2011-07-08 NOTE — Telephone Encounter (Signed)
Spoke with patient and advised results   

## 2011-07-08 NOTE — Telephone Encounter (Signed)
It is possible that he could have some GI side effects with the Parkinson's medicine. His dizziness preceded that treatment. He may need to back off on the ropinrole (like back down to bid) if he continues to not feel well

## 2011-07-08 NOTE — Telephone Encounter (Signed)
Please check on him later today  or in the morning

## 2011-07-08 NOTE — Telephone Encounter (Signed)
Patient called back to advise Dr. Alphonsus Sias that his BP is 177/104, he has taken his BP medication this morning at 8:00 and he is feeling nauseated.  He has cut back from Ropinrole three times daily to twice daily and he is waiting a return call from the Neurology office.  This doctor is out of the office but the on call doctor is suppose to return his call.  Spoke with Dr. Alphonsus Sias and he advised that patient needs to try and relax.  He stated that his BP may be up due to the fact that he is not feeling well and nauseated and that we need to wait to hear from the Neurologist.  Patient advised as instructed via telephone.  Advised him that if symptoms worsen or change he needs to let us know right away.

## 2011-07-09 NOTE — Telephone Encounter (Signed)
Spoke with patient, his BP is going down, he's not as dizzy, he ate a bowl of cereal this morning and didn't throw it up, also he still haven't heard back from his neurologist, but she was out yesterday

## 2011-07-09 NOTE — Telephone Encounter (Signed)
Have him continue the new BP med and just use the ropinrole bid till he hears from the neurologist

## 2011-07-09 NOTE — Telephone Encounter (Signed)
Spoke with patient earlier and advised that

## 2011-07-29 ENCOUNTER — Ambulatory Visit (INDEPENDENT_AMBULATORY_CARE_PROVIDER_SITE_OTHER): Payer: MEDICARE | Admitting: Internal Medicine

## 2011-07-29 DIAGNOSIS — I10 Essential (primary) hypertension: Secondary | ICD-10-CM

## 2011-07-29 MED ORDER — AMLODIPINE BESYLATE 2.5 MG PO TABS: 2.5000 mg | ORAL_TABLET | Freq: Every day | ORAL | Status: DC

## 2011-07-30 NOTE — Progress Notes (Signed)
  Subjective:    Patient ID: Johnny Barnett, male    DOB: [birth year], 70 y.o.   MRN: [id number]  HPI See scanned note   Review of Systems     Objective:   Physical Exam        Assessment & Plan:

## 2011-09-09 ENCOUNTER — Encounter: Payer: Self-pay | Admitting: Internal Medicine

## 2011-09-09 ENCOUNTER — Ambulatory Visit (INDEPENDENT_AMBULATORY_CARE_PROVIDER_SITE_OTHER): Payer: MEDICARE | Admitting: Internal Medicine

## 2011-09-09 DIAGNOSIS — K5909 Other constipation: Secondary | ICD-10-CM

## 2011-09-09 DIAGNOSIS — I10 Essential (primary) hypertension: Secondary | ICD-10-CM

## 2011-09-09 DIAGNOSIS — G20 Parkinson's disease: Secondary | ICD-10-CM

## 2011-09-09 DIAGNOSIS — G20A1 Parkinson's disease without dyskinesia, without mention of fluctuations: Secondary | ICD-10-CM

## 2011-09-09 LAB — BASIC METABOLIC PANEL
BUN: 14 mg/dL (ref 6–23)
CO2: 30 mEq/L (ref 19–32)
Calcium: 9 mg/dL (ref 8.4–10.5)
GFR: 82.17 mL/min (ref >60.00)
Glucose, Bld: 81 mg/dL (ref 70–99)
Potassium: 4.6 mEq/L (ref 3.5–5.1)
Sodium: 130 mEq/L — ABNORMAL LOW (ref 135–145)

## 2011-09-09 NOTE — Patient Instructions (Signed)
Please start miralax (polyethylene glycol) daily. You can still use the Milk of Magnesia if needed

## 2011-09-09 NOTE — Progress Notes (Signed)
Subjective:    Patient ID: Johnny Barnett, male    DOB: April 30, 1941, 70 y.o.   MRN: [id number]  HPI Still having some dizziness---seems to "be always with me" Has been walking again---1 mile 3 times per week. Feels better then --though he still gets tired easy, the dizziness is the same or better  Now on sinemet from neurologist There seems to be some improvement and probably less fatigue Still trouble with keyboard skills--finger dexterity Has appt in [facility]-- Dr Wallie Char  Ongoing constipation occ will have normal BM but mostly taking MOM every other day Taking colace daily without apparent effect  Has cold feelings in hands and feet  Seems to have done okay with adding the low dose ziac Has been checking most days 109/71-160/98 Mostly ~130-150/70-90  Current Outpatient Prescriptions on File Prior to Visit  Medication Sig Dispense Refill  . amLODipine (NORVASC) 2.5 MG tablet Take 1 tablet (2.5 mg total) by mouth daily.  30 tablet  11  . aspirin 81 MG EC tablet Take 81 mg by mouth daily.        . bisoprolol-hydrochlorothiazide (ZIAC) 2.5-6.25 MG per tablet Take 1 tablet by mouth daily.  30 tablet  11  . docusate sodium (COLACE) 100 MG capsule Take 100 mg by mouth daily.        . Glucosamine-Chondroit-Vit C-Mn (GLUCOSAMINE CHONDR 1500 COMPLX) CAPS Take by mouth daily.        Marland Kitchen loratadine (CLARITIN) 10 MG tablet Take 10 mg by mouth daily.        . Multiple Vitamin (MULTIVITAMIN) tablet Take 1 tablet by mouth daily.        Marland Kitchen rOPINIRole (REQUIP) 0.25 MG tablet Take 0.75 mg by mouth 3 (three) times daily.        Marland Kitchen triamcinolone (KENALOG) 0.1 % cream Apply topically 2 (two) times daily.          No Known Allergies  Past Medical History  Diagnosis Date  . Hyperlipidemia   . ED (erectile dysfunction)   . Degenerative joint disease     Past Surgical History  Procedure Date  . Flexible sigmoidoscopy 08/05    Family History  Problem Relation Age of  Onset  . Diabetes Paternal Uncle   . Coronary artery disease Neg Hx   . Hypertension Neg Hx     History   Social History  . Marital Status: Married    Spouse Name: N/A    Number of Children: 2  . Years of Education: N/A   Occupational History  . 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 Smoking status: Never Smoker   . Smokeless tobacco: Never Used  . Alcohol Use: Yes     wine  . Drug Use: No  . Sexually Active: Not on file   Other Topics Concern  . 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 No distress.  Neck: Normal range of motion. Neck supple. No thyromegaly present.  Cardiovascular: Normal rate, regular rhythm and normal heart sounds.  Exam reveals no gallop.   No murmur heard. Pulmonary/Chest: Effort normal and breath sounds normal. No respiratory distress. He has no wheezes. He has no rales.  Abdominal: Soft. There is no tenderness.  Musculoskeletal: He exhibits no edema and no tenderness.  Lymphadenopathy:    He has no cervical adenopathy.  Neurological:       Gait is fairly normal No sig resting tremor Normal tone  Psychiatric: He has a normal mood and affect. His behavior is normal.          Assessment & Plan:

## 2011-09-09 NOTE — Assessment & Plan Note (Signed)
BP Readings from Last 3 Encounters:  09/09/11 150/68  07/07/11 172/84  05/30/11 172/95   Better control Will not increase meds for fear of parkinson's related orthostasis

## 2011-09-09 NOTE — Assessment & Plan Note (Signed)
Troubled with this Will start miralax

## 2011-09-09 NOTE — Assessment & Plan Note (Signed)
Seems better on the sinemet Will be going to [facility] soon

## 2011-09-19 ENCOUNTER — Encounter: Payer: Self-pay | Admitting: Gastroenterology

## 2011-12-06 ENCOUNTER — Other Ambulatory Visit: Payer: Self-pay | Admitting: Internal Medicine

## 2012-01-08 ENCOUNTER — Ambulatory Visit: Payer: MEDICARE | Admitting: Internal Medicine

## 2012-01-09 ENCOUNTER — Encounter: Payer: Self-pay | Admitting: Internal Medicine

## 2012-01-09 ENCOUNTER — Ambulatory Visit (INDEPENDENT_AMBULATORY_CARE_PROVIDER_SITE_OTHER): Payer: Medicare Other | Admitting: Internal Medicine

## 2012-01-09 VITALS — BP 132/70 | HR 68 | Temp 97.8°F | Ht 68.0 in | Wt 172.0 lb

## 2012-01-09 DIAGNOSIS — G20 Parkinson's disease: Secondary | ICD-10-CM

## 2012-01-09 DIAGNOSIS — L03317 Cellulitis of buttock: Secondary | ICD-10-CM | POA: Insufficient documentation

## 2012-01-09 DIAGNOSIS — R42 Dizziness and giddiness: Secondary | ICD-10-CM

## 2012-01-09 DIAGNOSIS — R413 Other amnesia: Secondary | ICD-10-CM

## 2012-01-09 DIAGNOSIS — L0231 Cutaneous abscess of buttock: Secondary | ICD-10-CM

## 2012-01-09 DIAGNOSIS — G20A1 Parkinson's disease without dyskinesia, without mention of fluctuations: Secondary | ICD-10-CM

## 2012-01-09 DIAGNOSIS — I10 Essential (primary) hypertension: Secondary | ICD-10-CM

## 2012-01-09 MED ORDER — CEPHALEXIN 500 MG PO TABS: 500.0000 mg | ORAL_TABLET | Freq: Three times a day (TID) | ORAL | Status: AC

## 2012-01-09 MED ORDER — AMLODIPINE BESYLATE 2.5 MG PO TABS: 2.5000 mg | ORAL_TABLET | Freq: Every day | ORAL | Status: DC

## 2012-01-09 MED ORDER — BISOPROLOL-HYDROCHLOROTHIAZIDE 2.5-6.25 MG PO TABS: 1.0000 | ORAL_TABLET | Freq: Every day | ORAL | Status: DC

## 2012-01-09 NOTE — Assessment & Plan Note (Signed)
No progression of cognitive issues Likely Parkinson's related

## 2012-01-09 NOTE — Progress Notes (Signed)
Subjective:    Patient ID: Johnny Barnett, male    DOB: [birth year], 71 y.o.   MRN: [id number]  HPI Saw Dr Shanda Howells yesterday She will be his primary care neurologist--movement disorders specialist Had OT/PT?speech eval---has exercises and speech course to do Now on ropinirole in addition to the sinemet Bradykinesia and other symptoms are stable  Has been trying to walk regularly but has slacked off some Walks 45 minutes at a time  Dizziness persists Defies diagnosis BP is better Checks regularly --- varies from 109/69-140/82 No chest pain No SOB Some fatigue still  Current Outpatient Prescriptions on File Prior to Visit  Medication Sig Dispense Refill  . aspirin 81 MG EC tablet Take 81 mg by mouth daily.        . carbidopa-levodopa (SINEMET) 25-100 MG per tablet Take 1 tablet by mouth 3 (three) times daily.        . Glucosamine-Chondroit-Vit C-Mn (GLUCOSAMINE CHONDR 1500 COMPLX) CAPS Take by mouth daily.        . Multiple Vitamin (MULTIVITAMIN) tablet Take 1 tablet by mouth daily.        . polyethylene glycol (MIRALAX / GLYCOLAX) packet Take 17 g by mouth daily.        Marland Kitchen rOPINIRole (REQUIP) 0.25 MG tablet Take 0.75 mg by mouth 3 (three) times daily.        Marland Kitchen triamcinolone cream (KENALOG) 0.1 % APPLY TWICE A DAY AS NEEDED FOR ITCHY AREAS  30 g  6    No Known Allergies  Past Medical History  Diagnosis Date  . Hyperlipidemia   . ED (erectile dysfunction)   . Degenerative joint disease     Past Surgical History  Procedure Date  . Flexible sigmoidoscopy 08/05    Family History  Problem Relation Age of Onset  . Diabetes Paternal Uncle   . Coronary artery disease Neg Hx   . Hypertension Neg Hx     History   Social History  . Marital Status: Married    Spouse Name: N/A    Number of Children: 2  . Years of Education: N/A   Occupational History  . 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 Smoking status: Never Smoker    . Smokeless tobacco: Never Used  . Alcohol Use: Yes     wine  . Drug Use: No  . Sexually Active: Not on file   Other Topics Concern  . 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 No distress.  Neck: Normal range of motion. Neck supple. No thyromegaly present.  Cardiovascular: Normal rate, regular rhythm and normal heart sounds.  Exam reveals no gallop.   No murmur heard. Pulmonary/Chest: Effort normal and breath sounds normal. No respiratory distress. He has no wheezes. He has no rales.  Musculoskeletal: He exhibits no edema and no tenderness.  Lymphadenopathy:    He has no cervical adenopathy.  Neurological:       Mild bradykinesia No sig tremor  Skin:       Inflamed crusty area on right buttock Indurated underneath and red  with some tenderness  Psychiatric: He has a normal mood and affect. His behavior is normal.          Assessment & Plan:

## 2012-01-09 NOTE — Assessment & Plan Note (Signed)
He has a lesion that has been there for some time Doesn't look neoplastic Will treat with keflex If persists, may need to remove (probably with cryotherapy)

## 2012-01-09 NOTE — Assessment & Plan Note (Signed)
Persists and defies diagnosis No progression though and not debilitating Will observe only

## 2012-01-09 NOTE — Assessment & Plan Note (Signed)
Satisfied with stability of symptoms with meds A little frustrated that he didn't improve

## 2012-01-09 NOTE — Assessment & Plan Note (Signed)
BP Readings from Last 3 Encounters:  01/09/12 132/70  09/09/11 150/68  07/07/11 172/84   Good control No changes needed

## 2012-07-23 ENCOUNTER — Encounter: Payer: Self-pay | Admitting: Internal Medicine

## 2012-07-23 ENCOUNTER — Ambulatory Visit (INDEPENDENT_AMBULATORY_CARE_PROVIDER_SITE_OTHER): Payer: Medicare Other | Admitting: Internal Medicine

## 2012-07-23 VITALS — BP 124/82 | HR 65 | Temp 98.4°F | Wt 168.2 lb

## 2012-07-23 DIAGNOSIS — I10 Essential (primary) hypertension: Secondary | ICD-10-CM

## 2012-07-23 DIAGNOSIS — R413 Other amnesia: Secondary | ICD-10-CM

## 2012-07-23 DIAGNOSIS — R42 Dizziness and giddiness: Secondary | ICD-10-CM

## 2012-07-23 DIAGNOSIS — Z0000 Encounter for general adult medical examination without abnormal findings: Secondary | ICD-10-CM | POA: Insufficient documentation

## 2012-07-23 DIAGNOSIS — G20 Parkinson's disease: Secondary | ICD-10-CM

## 2012-07-23 LAB — CBC WITH DIFFERENTIAL/PLATELET
Basophils Absolute: 0 10*3/uL (ref 0.0–0.1)
Eosinophils Absolute: 0.3 10*3/uL (ref 0.0–0.7)
Lymphocytes Relative: 13.7 % (ref 12.0–46.0)
MCHC: 32.8 g/dL (ref 30.0–36.0)
MCV: 101.2 fl — ABNORMAL HIGH (ref 78.0–100.0)
Monocytes Absolute: 0.6 10*3/uL (ref 0.1–1.0)
Neutrophils Relative %: 74.6 % (ref 43.0–77.0)
Platelets: 212 10*3/uL (ref 150.0–400.0)
RBC: 4.24 Mil/uL (ref 4.22–5.81)

## 2012-07-23 LAB — BASIC METABOLIC PANEL
BUN: 18 mg/dL (ref 6–23)
CO2: 29 mEq/L (ref 19–32)
Calcium: 8.7 mg/dL (ref 8.4–10.5)
Chloride: 88 mEq/L — ABNORMAL LOW (ref 96–112)
Creatinine, Ser: 1 mg/dL (ref 0.4–1.5)

## 2012-07-23 LAB — HEPATIC FUNCTION PANEL
Bilirubin, Direct: 0 mg/dL (ref 0.0–0.3)
Total Bilirubin: 0.6 mg/dL (ref 0.3–1.2)

## 2012-07-23 LAB — TSH: TSH: 1.08 u[IU]/mL (ref 0.35–5.50)

## 2012-07-23 NOTE — Assessment & Plan Note (Signed)
BP Readings from Last 3 Encounters:  07/23/12 124/82  01/09/12 132/70  09/09/11 150/68   Good control No changes for now Check labs

## 2012-07-23 NOTE — Progress Notes (Signed)
Subjective:    Patient ID: Johnny Barnett, male    DOB: Apr 22, 1941, 71 y.o.   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 amLODipine (NORVASC) 2.5 MG tablet Take 1 tablet (2.5 mg total) by mouth daily.  90 tablet  3  . aspirin 81 MG EC tablet Take 81 mg by mouth daily.        . bisoprolol-hydrochlorothiazide (ZIAC) 2.5-6.25 MG per tablet Take 1 tablet by mouth daily.  90 tablet  3  . carbidopa-levodopa (SINEMET) 25-100 MG per tablet Take 1 tablet by mouth 3 (three) times daily.        . Glucosamine-Chondroit-Vit C-Mn (GLUCOSAMINE CHONDR 1500 COMPLX) CAPS Take by mouth daily.        . Multiple Vitamin (MULTIVITAMIN) tablet Take 1 tablet by mouth daily.        . polyethylene glycol (MIRALAX / GLYCOLAX) packet Take 17 g by mouth daily.        Marland Kitchen rOPINIRole (REQUIP) 0.25 MG tablet Take 0.75 mg by mouth 3 (three) times daily.       Marland Kitchen triamcinolone cream (KENALOG) 0.1 % APPLY TWICE A DAY AS NEEDED FOR ITCHY AREAS  30 g  6    No Known Allergies  Past Medical History  Diagnosis Date  . Hyperlipidemia   . ED (erectile dysfunction)   . Degenerative joint disease     Past Surgical History  Procedure Date  . Flexible sigmoidoscopy 08/05    Family History  Problem Relation Age of Onset  . Diabetes  Paternal Uncle   . Coronary artery disease Neg Hx   . Hypertension Neg Hx     History   Social History  . Marital Status: Married    Spouse Name: N/A    Number of Children: 2  . Years of Education: N/A   Occupational History  . 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 Smoking status: Never Smoker   . Smokeless tobacco: Never Used  . Alcohol Use: Yes     wine  . Drug Use: No  . Sexually Active: Not on file   Other Topics Concern  . 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       Wears seat belt  HENT: Negative for hearing loss, congestion, rhinorrhea, dental problem and tinnitus.        Sees dentist every 3 months  Eyes: Negative for visual disturbance.       No diplopia or unilateral vision  loss Keeps up with optometrist---early cataract  Cardiovascular: Negative for chest pain, palpitations and leg swelling.  Gastrointestinal: Positive for constipation. Negative for nausea, vomiting, abdominal pain and blood in stool.       Regular constipation. MOM helps Discussed using miralax regularly No heartburn  Genitourinary:       Nocturia x 2 No sig daytime problems No sex--no problem  Musculoskeletal: Positive for back pain. Negative for joint swelling and arthralgias.       Gets tired, sore legs if extended walking Occ back pain from degenerated disc. Sees chiropractor monthly ([place])  Skin: Negative for rash.       Has dry skin--not a problem in summer  Neurological: Positive for dizziness. Negative for syncope, weakness,  light-headedness, numbness and headaches.  Hematological: Negative for adenopathy. Does not bruise/bleed easily.  Psychiatric/Behavioral: Negative for disturbed wake/sleep cycle and dysphoric mood. The patient is not nervous/anxious.        Objective:   Physical Exam  Constitutional: He is oriented to person, place, and time. He appears well-developed and well-nourished. No distress.  HENT:  Head: Normocephalic and atraumatic.  Right Ear: External ear normal.  Left Ear: External ear normal.  Mouth/Throat: Oropharynx is clear and moist. No oropharyngeal exudate.  Eyes: Conjunctivae normal and EOM are normal. Pupils are equal, round, and reactive to light.  Neck: Normal range of motion. Neck supple. No thyromegaly present.  Cardiovascular: Normal rate, regular rhythm and normal heart sounds.  Exam reveals no gallop.   No murmur heard. Pulmonary/Chest: Effort normal and breath sounds normal. No respiratory distress. He has no wheezes. He has no rales.  Abdominal: Soft. There is no tenderness.  Musculoskeletal: He exhibits no edema and no tenderness.  Lymphadenopathy:    He has no cervical adenopathy.  Neurological: He is alert and oriented to person, place, and time. He exhibits normal muscle tone.       President  "Gar[place], [place], ?" [phone]  Slight resting tremor No sig bradykinesia  Skin: No rash noted. No erythema.  Psychiatric: He has a normal mood and affect. His behavior is normal. Thought content normal.          Assessment & Plan:

## 2012-07-23 NOTE — Assessment & Plan Note (Signed)
Seems stable Follows with Dr Shanda Howells Needs handicapped permit mostly for work---being made to park at distant lot and difficult when he has briefcase, etc

## 2012-07-23 NOTE — Assessment & Plan Note (Signed)
UTD on imms and colonoscopy No PSA due to age 

## 2012-07-23 NOTE — Assessment & Plan Note (Signed)
Mild and not particularly progressive at this point

## 2012-07-23 NOTE — Assessment & Plan Note (Signed)
Persists Clearly not orthostatic Started before the meds for BP--- actually started meds in case that would help (since BP had been up) Dr Shanda Howells asks about change to ACEI---not sure changing make sense with accepting new risks of side effects

## 2012-07-27 ENCOUNTER — Encounter: Payer: Self-pay | Admitting: *Deleted

## 2012-07-29 ENCOUNTER — Encounter: Payer: Self-pay | Admitting: Internal Medicine

## 2012-07-29 ENCOUNTER — Ambulatory Visit (INDEPENDENT_AMBULATORY_CARE_PROVIDER_SITE_OTHER): Payer: Medicare Other | Admitting: Internal Medicine

## 2012-07-29 ENCOUNTER — Telehealth: Payer: Self-pay | Admitting: Internal Medicine

## 2012-07-29 VITALS — BP 158/80 | HR 70 | Temp 98.0°F | Ht 68.0 in | Wt 169.0 lb

## 2012-07-29 DIAGNOSIS — K5909 Other constipation: Secondary | ICD-10-CM | POA: Insufficient documentation

## 2012-07-29 DIAGNOSIS — K5900 Constipation, unspecified: Secondary | ICD-10-CM

## 2012-07-29 DIAGNOSIS — I10 Essential (primary) hypertension: Secondary | ICD-10-CM

## 2012-07-29 LAB — BASIC METABOLIC PANEL
BUN: 15 mg/dL (ref 6–23)
CO2: 30 mEq/L (ref 19–32)
Chloride: 88 mEq/L — ABNORMAL LOW (ref 96–112)
GFR: 56.26 mL/min — ABNORMAL LOW (ref >60.00)
Glucose, Bld: 97 mg/dL (ref 70–99)
Potassium: 4 mEq/L (ref 3.5–5.1)
Sodium: 125 mEq/L — ABNORMAL LOW (ref 135–145)

## 2012-07-29 NOTE — Telephone Encounter (Signed)
Will evaluate and then decide on appropriate regimen

## 2012-07-29 NOTE — Assessment & Plan Note (Signed)
Related to the Parkinson's Will clear with miralax then start regular regimen

## 2012-07-29 NOTE — Progress Notes (Signed)
  Subjective:    Patient ID: Johnny Barnett, male    DOB: Aug 21, 1941, 71 y.o.   MRN: [id number]  HPI Did stop the BP med after the lab work Feels like his energy is somewhat better now  Constipation now Last stool 5 days ago--and not much then either MOM each of the last 3 nights Tried enema last night and the night before Had very small piece of stool this Am and has passed gas (gets urgency) and mucus Has needed pad due to some incontinence Tried miralax for 3 days --tried after MOM. Relates the gas to this  Current Outpatient Prescriptions on File Prior to Visit  Medication Sig Dispense Refill  . amLODipine (NORVASC) 2.5 MG tablet Take 1 tablet (2.5 mg total) by mouth daily.  90 tablet  3  . aspirin 81 MG EC tablet Take 81 mg by mouth daily.        . carbidopa-levodopa (SINEMET) 25-100 MG per tablet Take 1 tablet by mouth 3 (three) times daily.        . Glucosamine-Chondroit-Vit C-Mn (GLUCOSAMINE CHONDR 1500 COMPLX) CAPS Take by mouth daily.        . Multiple Vitamin (MULTIVITAMIN) tablet Take 1 tablet by mouth daily.        . polyethylene glycol (MIRALAX / GLYCOLAX) packet Take 17 g by mouth daily.        Marland Kitchen rOPINIRole (REQUIP) 0.25 MG tablet Take 0.75 mg by mouth 3 (three) times daily.       Marland Kitchen triamcinolone cream (KENALOG) 0.1 % APPLY TWICE A DAY AS NEEDED FOR ITCHY AREAS  30 g  6  . DISCONTD: bisoprolol-hydrochlorothiazide (ZIAC) 2.5-6.25 MG per tablet Take 1 tablet by mouth daily.  90 tablet  3    No Known Allergies  Past Medical History  Diagnosis Date  . Hyperlipidemia   . ED (erectile dysfunction)   . Degenerative joint disease     Past Surgical History  Procedure Date  . Flexible sigmoidoscopy 08/05    Family History  Problem Relation Age of Onset  . Diabetes Paternal Uncle   . Coronary artery disease Neg Hx   . Hypertension Neg Hx     History   Social History  . Marital Status: Married    Spouse Name: N/A    Number of Children: 2  . Years of  Education: N/A   Occupational History  . 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 Smoking status: Never Smoker   . Smokeless tobacco: Never Used  . Alcohol Use: Yes     wine  . Drug Use: No  . Sexually Active: Not on file   Other Topics Concern  . 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 No distress.  Abdominal: Soft. Bowel sounds are normal. He exhibits no distension. There is no tenderness. There is no rebound and no guarding.          Assessment & Plan:

## 2012-07-29 NOTE — Patient Instructions (Addendum)
Please take a capful of polyethylene glycol in a full glass of water every 2-3 hours for today and tomorrow. Start senna-s 2 tabs daily If not cleared out by tomorrow afternoon, you can try the milk of magnesia again

## 2012-07-29 NOTE — Telephone Encounter (Signed)
Caller: Johnny Barnett/Patient; Patient Name: Johnny Barnett; PCP: Tillman Abide [facility]); Best Callback Phone Number: [phone]. Caller who has Parkinson's is having some problems with constipation and home care as directed has not helped. Last bowel movement was on Saturday 07/24/12. He has used Milk of Magnesia x 3 at bedtime and 2 enemas at bedtime without bowel movement. Per Constipation Protocol, No Bowel Movement for one week and unresponsive to home care, See MD in 24 hours. Appointment scheduled for today, Thursday 9/19 at 2pm with Dr Alphonsus Sias. Caller is agreeable.

## 2012-07-29 NOTE — Assessment & Plan Note (Signed)
BP Readings from Last 3 Encounters:  07/29/12 158/80  07/23/12 124/82  01/09/12 132/70   Higher now Will not restart ziac since he thinks his energy may be better now May need other med (?ARB) Will recheck the sodium and then follow up in a month or so

## 2012-07-30 ENCOUNTER — Encounter: Payer: Self-pay | Admitting: *Deleted

## 2012-07-30 ENCOUNTER — Encounter: Payer: Self-pay | Admitting: Gastroenterology

## 2012-08-02 ENCOUNTER — Emergency Department: Payer: Self-pay | Admitting: Emergency Medicine

## 2012-08-02 ENCOUNTER — Telehealth: Payer: Self-pay | Admitting: Internal Medicine

## 2012-08-02 LAB — COMPREHENSIVE METABOLIC PANEL
Alkaline Phosphatase: 58 U/L (ref 50–136)
BUN: 13 mg/dL (ref 7–18)
Calcium, Total: 8.7 mg/dL (ref 8.5–10.1)
Chloride: 93 mmol/L — ABNORMAL LOW (ref 98–107)
Co2: 27 mmol/L (ref 21–32)
Creatinine: 0.93 mg/dL (ref 0.60–1.30)
Osmolality: 259 (ref 275–301)
Sodium: 129 mmol/L — ABNORMAL LOW (ref 136–145)

## 2012-08-02 LAB — CBC
HCT: 41.3 % (ref 40.0–52.0)
MCHC: 35.3 g/dL (ref 32.0–36.0)
Platelet: 203 10*3/uL (ref 150–440)
RBC: 4.25 10*6/uL — ABNORMAL LOW (ref 4.40–5.90)
RDW: 12.4 % (ref 11.5–14.5)

## 2012-08-02 NOTE — Telephone Encounter (Signed)
Call from Dr Carollee Massed in ER at [facility]  Has tight feeling in chest Looks fine Doesn't think this is acute coronary syndorme BP is still high Sodium up to 129

## 2012-08-02 NOTE — Telephone Encounter (Signed)
No chest tightness now Discussed with Dr Carollee Massed Will increase the amlodipine to 5mg  daily Keep appt 10/24 with me unless he has further symptoms  Dee, Please check on him 9/24 Make sure he is doing okay

## 2012-08-02 NOTE — Telephone Encounter (Signed)
THE PATIENT REFUSED 911: Caller: Cal/Patient; Patient Name: Johnny Barnett; PCP: Tillman Abide [facility]); Best Callback Phone Number: [phone]; Reason for call: Chest Pain/Chest Discomfort; symptoms started 08/02/12 at 7:30pm; pt states that he is having chest tightness located in center of chest below breast bone; denies any other symptoms; no shortness of breath; BP has been running high most of 08/01/12; BP 165/100; Pulse 89; Triaged per Chest Pain Guideline; Call 911 due to pressure, fullness or pain in the chest lasting 5 minutes or more now or within the last hour; pt requested RN to call 911 for him; 911 called and conferenced with pt for address; 911 in route now; wife is with patient; instructed pt to take Aspirin 325mg  chewed with a sip of water now; will comply; resting with wife until 911 arrives; [facility] ER; OFFICE PLEASE NOTE 911 DISPOSTION

## 2012-08-04 NOTE — Telephone Encounter (Signed)
appt scheduled 08/10/2012 @ 10:45

## 2012-08-04 NOTE — Telephone Encounter (Signed)
Why don't you set up appt with me next week to recheck his BP and see how he is doing That BP is better but probably best if we don't wait a month for his follow up

## 2012-08-04 NOTE — Telephone Encounter (Signed)
Spoke with patient, and BP has come down to150/90, a lot less than it was. Pt is at work and feel a little weaker. Please advise

## 2012-08-05 LAB — URINALYSIS, COMPLETE
Bacteria: NONE SEEN
Bilirubin,UR: NEGATIVE
Glucose,UR: NEGATIVE mg/dL (ref 0–75)
Leukocyte Esterase: NEGATIVE
Nitrite: NEGATIVE
Ph: 6 (ref 4.5–8.0)
RBC,UR: 13 /HPF (ref 0–5)
Squamous Epithelial: NONE SEEN
WBC UR: 2 /HPF (ref 0–5)

## 2012-08-05 LAB — CBC
HCT: 44.1 % (ref 40.0–52.0)
MCHC: 35.1 g/dL (ref 32.0–36.0)
MCV: 98 fL (ref 80–100)
Platelet: 251 10*3/uL (ref 150–440)
RBC: 4.51 10*6/uL (ref 4.40–5.90)
RDW: 12.9 % (ref 11.5–14.5)
WBC: 8.7 10*3/uL (ref 3.8–10.6)

## 2012-08-05 LAB — COMPREHENSIVE METABOLIC PANEL
Albumin: 4.2 g/dL (ref 3.4–5.0)
Alkaline Phosphatase: 66 U/L (ref 50–136)
BUN: 18 mg/dL (ref 7–18)
Bilirubin,Total: 0.7 mg/dL (ref 0.2–1.0)
Calcium, Total: 8.9 mg/dL (ref 8.5–10.1)
Creatinine: 0.95 mg/dL (ref 0.60–1.30)
EGFR (African American): >60
EGFR (Non-African Amer.): >60
SGPT (ALT): 9 U/L — ABNORMAL LOW (ref 12–78)
Total Protein: 8.2 g/dL (ref 6.4–8.2)

## 2012-08-06 ENCOUNTER — Inpatient Hospital Stay: Payer: Self-pay | Admitting: Surgery

## 2012-08-06 DIAGNOSIS — R079 Chest pain, unspecified: Secondary | ICD-10-CM

## 2012-08-07 LAB — COMPREHENSIVE METABOLIC PANEL
Albumin: 3.2 g/dL — ABNORMAL LOW (ref 3.4–5.0)
Anion Gap: 12 (ref 7–16)
BUN: 16 mg/dL (ref 7–18)
Calcium, Total: 8.3 mg/dL — ABNORMAL LOW (ref 8.5–10.1)
Chloride: 97 mmol/L — ABNORMAL LOW (ref 98–107)
Creatinine: 0.96 mg/dL (ref 0.60–1.30)
EGFR (African American): >60
EGFR (Non-African Amer.): >60
Glucose: 71 mg/dL (ref 65–99)
SGOT(AST): 19 U/L (ref 15–37)
SGPT (ALT): 11 U/L — ABNORMAL LOW (ref 12–78)
Total Protein: 6.6 g/dL (ref 6.4–8.2)

## 2012-08-07 LAB — CBC WITH DIFFERENTIAL/PLATELET
Basophil %: 0.5 %
Eosinophil #: 0.3 10*3/uL (ref 0.0–0.7)
Eosinophil %: 4.6 %
HGB: 13.9 g/dL (ref 13.0–18.0)
Lymphocyte #: 1 10*3/uL (ref 1.0–3.6)
Lymphocyte %: 13.9 %
MCH: 33.9 pg (ref 26.0–34.0)
Monocyte #: 0.5 x10 3/mm (ref 0.2–1.0)
Neutrophil #: 5.1 10*3/uL (ref 1.4–6.5)
Neutrophil %: 73.3 %
Platelet: 216 10*3/uL (ref 150–440)
RBC: 4.11 10*6/uL — ABNORMAL LOW (ref 4.40–5.90)

## 2012-08-09 ENCOUNTER — Telehealth: Payer: Self-pay | Admitting: Internal Medicine

## 2012-08-09 NOTE — Telephone Encounter (Signed)
Discussed with him Reviewed the note from Dr Michela Pitcher about his decompression with colonoscopy Now on bisoprolol again since BP was up more Due for surgery on Wednesday

## 2012-08-09 NOTE — Telephone Encounter (Signed)
The patient wanted Dr.Letvak to know he went to the emergency room last night for a "twisted" colon.  He is scheduled for surgery on Wednesday to have part of his colon removed.  They increased his blood pressure medicine while he was in the emergency room.

## 2012-08-10 ENCOUNTER — Ambulatory Visit: Payer: Medicare Other | Admitting: Internal Medicine

## 2012-08-10 HISTORY — PX: OTHER SURGICAL HISTORY: SHX169

## 2012-08-11 ENCOUNTER — Inpatient Hospital Stay: Payer: Self-pay | Admitting: Surgery

## 2012-08-12 LAB — CBC WITH DIFFERENTIAL/PLATELET
Basophil %: 0.4 %
Eosinophil #: 0.3 10*3/uL (ref 0.0–0.7)
Eosinophil %: 2.9 %
HGB: 13 g/dL (ref 13.0–18.0)
Lymphocyte %: 8.1 %
MCH: 34.5 pg — ABNORMAL HIGH (ref 26.0–34.0)
Monocyte #: 0.4 x10 3/mm (ref 0.2–1.0)
Neutrophil #: 7.8 10*3/uL — ABNORMAL HIGH (ref 1.4–6.5)
Neutrophil %: 84 %
RBC: 3.77 10*6/uL — ABNORMAL LOW (ref 4.40–5.90)
WBC: 9.3 10*3/uL (ref 3.8–10.6)

## 2012-08-12 LAB — BASIC METABOLIC PANEL
BUN: 17 mg/dL (ref 7–18)
Creatinine: 1.12 mg/dL (ref 0.60–1.30)
Glucose: 109 mg/dL — ABNORMAL HIGH (ref 65–99)
Sodium: 137 mmol/L (ref 136–145)

## 2012-08-12 LAB — PATHOLOGY REPORT

## 2012-08-13 LAB — BASIC METABOLIC PANEL
BUN: 12 mg/dL (ref 7–18)
Calcium, Total: 8.1 mg/dL — ABNORMAL LOW (ref 8.5–10.1)
Chloride: 99 mmol/L (ref 98–107)
Co2: 27 mmol/L (ref 21–32)
Glucose: 112 mg/dL — ABNORMAL HIGH (ref 65–99)
Osmolality: 269 (ref 275–301)
Potassium: 3.9 mmol/L (ref 3.5–5.1)

## 2012-08-16 LAB — BASIC METABOLIC PANEL
Anion Gap: 7 (ref 7–16)
Calcium, Total: 8.8 mg/dL (ref 8.5–10.1)
Chloride: 96 mmol/L — ABNORMAL LOW (ref 98–107)
Co2: 29 mmol/L (ref 21–32)
EGFR (Non-African Amer.): >60
Glucose: 103 mg/dL — ABNORMAL HIGH (ref 65–99)
Potassium: 3.7 mmol/L (ref 3.5–5.1)
Sodium: 132 mmol/L — ABNORMAL LOW (ref 136–145)

## 2012-08-16 LAB — PLATELET COUNT: Platelet: 285 10*3/uL (ref 150–440)

## 2012-08-17 ENCOUNTER — Ambulatory Visit: Payer: Medicare Other | Admitting: Internal Medicine

## 2012-08-17 ENCOUNTER — Encounter: Payer: Self-pay | Admitting: Internal Medicine

## 2012-08-18 LAB — BASIC METABOLIC PANEL
Anion Gap: 9 (ref 7–16)
Calcium, Total: 8.7 mg/dL (ref 8.5–10.1)
Creatinine: 0.92 mg/dL (ref 0.60–1.30)
EGFR (African American): >60
Glucose: 88 mg/dL (ref 65–99)
Osmolality: 270 (ref 275–301)
Potassium: 3.5 mmol/L (ref 3.5–5.1)
Sodium: 135 mmol/L — ABNORMAL LOW (ref 136–145)

## 2012-09-02 ENCOUNTER — Encounter: Payer: Self-pay | Admitting: Internal Medicine

## 2012-09-02 ENCOUNTER — Ambulatory Visit (INDEPENDENT_AMBULATORY_CARE_PROVIDER_SITE_OTHER): Payer: Medicare Other | Admitting: Internal Medicine

## 2012-09-02 VITALS — BP 140/78 | HR 68 | Temp 98.3°F | Wt 154.0 lb

## 2012-09-02 DIAGNOSIS — K562 Volvulus: Secondary | ICD-10-CM

## 2012-09-02 DIAGNOSIS — G20 Parkinson's disease: Secondary | ICD-10-CM

## 2012-09-02 DIAGNOSIS — I10 Essential (primary) hypertension: Secondary | ICD-10-CM

## 2012-09-02 LAB — CBC WITH DIFFERENTIAL/PLATELET
Basophils Absolute: 0 10*3/uL (ref 0.0–0.1)
Eosinophils Absolute: 0.4 10*3/uL (ref 0.0–0.7)
Lymphocytes Relative: 12.2 % (ref 12.0–46.0)
MCHC: 33.8 g/dL (ref 30.0–36.0)
Neutro Abs: 6.8 10*3/uL (ref 1.4–7.7)
Neutrophils Relative %: 77.1 % — ABNORMAL HIGH (ref 43.0–77.0)
RDW: 12.7 % (ref 11.5–14.6)

## 2012-09-02 MED ORDER — BISOPROLOL FUMARATE 5 MG PO TABS: 5.0000 mg | ORAL_TABLET | Freq: Every day | ORAL | Status: DC

## 2012-09-02 NOTE — Assessment & Plan Note (Signed)
BP Readings from Last 3 Encounters:  09/02/12 140/78  07/29/12 158/80  07/23/12 124/82   Good control Tolerating current meds but may need to reassess after he gains his weight back

## 2012-09-02 NOTE — Progress Notes (Signed)
Subjective:    Patient ID: Johnny Barnett, male    DOB: [birth year], 71 y.o.   MRN: [id number]  HPI Here with wife Has been recovering Lost a lot of weight ---had sigmoid volvulus resected October 2nd Appetite is still not normal Bowels are irregular but improving Using docusate but not MOM again Has been cutting down the hydrocodone now--- down to bid to tid and reducing further  Monitoring BP closely Ran up after off ziac--- 150's-160's systolic Restarted the bisoprolol after increasing amlodipine Systolic BP now 109-141 Some dizziness which is more striking when he stands up No syncope No edema Limited on lifting so not much activity---does walk to mailbox and back (0.2 miles) No exertional CP, dyspnea or dizziness  Parkinson's is stable  Current Outpatient Prescriptions on File Prior to Visit  Medication Sig Dispense Refill  . aspirin 81 MG EC tablet Take 81 mg by mouth daily.        . bisoprolol (ZEBETA) 5 MG tablet Take 5 mg by mouth daily.       . carbidopa-levodopa (SINEMET) 25-100 MG per tablet Take 1 tablet by mouth 3 (three) times daily.        . Glucosamine-Chondroit-Vit C-Mn (GLUCOSAMINE CHONDR 1500 COMPLX) CAPS Take by mouth daily.        . Multiple Vitamin (MULTIVITAMIN) tablet Take 1 tablet by mouth daily.        Marland Kitchen rOPINIRole (REQUIP) 0.25 MG tablet Take 0.75 mg by mouth 3 (three) times daily.       . sennosides-docusate sodium (SENOKOT-S) 8.6-50 MG tablet Take 2 tablets by mouth daily.      Marland Kitchen triamcinolone cream (KENALOG) 0.1 % APPLY TWICE A DAY AS NEEDED FOR ITCHY AREAS  30 g  6    No Known Allergies  Past Medical History  Diagnosis Date  . Hyperlipidemia   . ED (erectile dysfunction)   . Degenerative joint disease     Past Surgical History  Procedure Date  . Flexible sigmoidoscopy 08/05  . Sigmoid volvulus resection 10/13    Dr Michela Pitcher    Family History  Problem Relation Age of Onset  . Diabetes Paternal Uncle   . Coronary artery disease Neg Hx     . Hypertension Neg Hx     History   Social History  . Marital Status: Married    Spouse Name: N/A    Number of Children: 2  . Years of Education: N/A   Occupational History  . 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 Smoking status: Never Smoker   . Smokeless tobacco: Never Used  . Alcohol Use: Yes     wine  . Drug Use: No  . Sexually Active: Not on file   Other Topics Concern  . 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 No distress.       Clearly has lost some weight  Neck: Normal range of motion. Neck supple. No thyromegaly present.  Cardiovascular: Normal rate, regular rhythm and normal heart sounds.  Exam reveals no gallop.   No murmur heard. Pulmonary/Chest: Effort normal and breath sounds normal. No respiratory distress. He has no wheezes. He has no rales.  Abdominal: Soft. Bowel sounds are normal. He exhibits no distension. There is no tenderness. There is no rebound and no guarding.       Wound is clean and dry  Musculoskeletal: He exhibits no edema and no tenderness.  Lymphadenopathy:    He has no cervical adenopathy.  Psychiatric: He has a normal mood and affect. His behavior is normal.          Assessment & Plan:

## 2012-09-02 NOTE — Assessment & Plan Note (Signed)
Now almost 3 weeks post op Pain improving and decreasing narcotics Lost weight but starting to regain Bowels are better

## 2012-09-02 NOTE — Assessment & Plan Note (Signed)
Mild orthostatic symptoms  May have to rethink the bisoprolol if this worsens

## 2012-09-03 LAB — BASIC METABOLIC PANEL
CO2: 29 mEq/L (ref 19–32)
Calcium: 8.9 mg/dL (ref 8.4–10.5)
Creatinine, Ser: 1.3 mg/dL (ref 0.4–1.5)
Glucose, Bld: 90 mg/dL (ref 70–99)

## 2012-09-06 ENCOUNTER — Encounter: Payer: Self-pay | Admitting: *Deleted

## 2012-09-07 ENCOUNTER — Telehealth: Payer: Self-pay

## 2012-09-07 MED ORDER — AMLODIPINE BESYLATE 5 MG PO TABS: 5.0000 mg | ORAL_TABLET | Freq: Every day | ORAL | Status: DC

## 2012-09-07 NOTE — Telephone Encounter (Signed)
rx sent to pharmacy by e-script Spoke with patient and advised results   

## 2012-09-07 NOTE — Telephone Encounter (Signed)
Okay to refill the amlodipine for a year  Please let him know that the hemoglobin and hematocrit are better measures of RBC status. I am not concerned at all about the borderline value for RBC on the blood count results (clinically not relevant)

## 2012-09-07 NOTE — Telephone Encounter (Signed)
Pt left v/m if Dr Alphonsus Sias wants pt to continue the Amlodipine at 5mg  daily needs refill to [facility]. Pt also concerned about RBC being low. Pt wants to know if anything needs to be done about low RBC.Please advise.

## 2012-09-27 ENCOUNTER — Encounter: Payer: Self-pay | Admitting: Neurosurgery

## 2012-12-02 ENCOUNTER — Encounter: Payer: Self-pay | Admitting: Internal Medicine

## 2012-12-02 ENCOUNTER — Ambulatory Visit (INDEPENDENT_AMBULATORY_CARE_PROVIDER_SITE_OTHER): Payer: Medicare Other | Admitting: Internal Medicine

## 2012-12-02 VITALS — BP 132/70 | HR 62 | Temp 97.5°F | Wt 169.0 lb

## 2012-12-02 DIAGNOSIS — L570 Actinic keratosis: Secondary | ICD-10-CM

## 2012-12-02 DIAGNOSIS — R42 Dizziness and giddiness: Secondary | ICD-10-CM

## 2012-12-02 DIAGNOSIS — I10 Essential (primary) hypertension: Secondary | ICD-10-CM

## 2012-12-02 DIAGNOSIS — G20A1 Parkinson's disease without dyskinesia, without mention of fluctuations: Secondary | ICD-10-CM

## 2012-12-02 DIAGNOSIS — G20 Parkinson's disease: Secondary | ICD-10-CM

## 2012-12-02 MED ORDER — LOSARTAN POTASSIUM 50 MG PO TABS: 50.0000 mg | ORAL_TABLET | Freq: Every day | ORAL | Status: DC

## 2012-12-02 NOTE — Assessment & Plan Note (Signed)
~  15-12mm crusted lesion on left forehead which has mild inflammation  Liquid nitrogen 45 seconds x 2  Tolerated well  Discussed home care

## 2012-12-02 NOTE — Assessment & Plan Note (Signed)
Stable functional status on his meds He seems to be satisfied

## 2012-12-02 NOTE — Assessment & Plan Note (Signed)
This is much better No clear indication that ropinirole is involved Will assess after change

## 2012-12-02 NOTE — Assessment & Plan Note (Signed)
BP Readings from Last 3 Encounters:  12/02/12 132/70  09/02/12 140/78  07/29/12 158/80   Has good control His cool extremities could be from the bisoprolol. Given his Parkinson's and possible concern for orthostasis, will change to ARB

## 2012-12-02 NOTE — Progress Notes (Signed)
Subjective:    Patient ID: Johnny Barnett, male    DOB: July 20, 1941, 72 y.o.   MRN: [id number]  HPI Has gained his weight back More energy now but still not back to normal  Dizziness is rare Gets heavy headed feeling much less often BP has been good--- 120-140/60-70's mostly with rare higher  Has feelings of cold in hands and feet May have some swelling in feet Really bothers him  Still on the 0.75 dose of ropinirole He didn't decrease this dose and doesn't have sense of this being part of the problem  No headaches No chest pain No SOB  Lesion on left temple is flaking  It has changed He is concerned about it  Current Outpatient Prescriptions on File Prior to Visit  Medication Sig Dispense Refill  . amLODipine (NORVASC) 5 MG tablet Take 1 tablet (5 mg total) by mouth daily.  90 tablet  3  . aspirin 81 MG EC tablet Take 81 mg by mouth daily.        . bisoprolol (ZEBETA) 5 MG tablet Take 1 tablet (5 mg total) by mouth daily.  30 tablet  11  . carbidopa-levodopa (SINEMET) 25-100 MG per tablet Take 1 tablet by mouth 3 (three) times daily.        . Glucosamine-Chondroit-Vit C-Mn (GLUCOSAMINE CHONDR 1500 COMPLX) CAPS Take by mouth daily.        . Multiple Vitamin (MULTIVITAMIN) tablet Take 1 tablet by mouth daily.        Marland Kitchen rOPINIRole (REQUIP) 0.25 MG tablet Take 0.75 mg by mouth 3 (three) times daily.       . sennosides-docusate sodium (SENOKOT-S) 8.6-50 MG tablet Take 2 tablets by mouth daily.      Marland Kitchen triamcinolone cream (KENALOG) 0.1 % APPLY TWICE A DAY AS NEEDED FOR ITCHY AREAS  30 g  6    No Known Allergies  Past Medical History  Diagnosis Date  . Hyperlipidemia   . ED (erectile dysfunction)   . Degenerative joint disease     Past Surgical History  Procedure Date  . Flexible sigmoidoscopy 08/05  . Sigmoid volvulus resection 10/13    Dr Michela Pitcher    Family History  Problem Relation Age of Onset  . Diabetes Paternal Uncle   . Coronary artery disease Neg Hx   .  Hypertension Neg Hx     History   Social History  . Marital Status: Married    Spouse Name: N/A    Number of Children: 2  . Years of Education: N/A   Occupational History  . 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 Smoking status: Never Smoker   . Smokeless tobacco: Never Used  . Alcohol Use: Yes     Comment: wine  . Drug Use: No  . Sexually Active: Not on file   Other Topics Concern  . 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 No known injury Sleeps fairly well if he avoids fluids in evening Nocturia x 2     Objective:  Physical Exam  Constitutional: He appears well-developed and well-nourished.  Neck: Normal range of motion. Neck supple. No thyromegaly present.  Cardiovascular: Normal rate, regular rhythm and normal heart sounds.  Exam reveals no gallop.   No murmur heard. Pulmonary/Chest: Effort normal and breath sounds normal. No respiratory distress. He has no wheezes. He has no rales.  Musculoskeletal:       Hands and feet are cool distally but normal pulses No sig edema  Lymphadenopathy:    He has no cervical adenopathy.  Psychiatric: He has a normal mood and affect. His behavior is normal.          Assessment & Plan:

## 2012-12-27 ENCOUNTER — Other Ambulatory Visit: Payer: Self-pay

## 2012-12-27 MED ORDER — TRIAMCINOLONE ACETONIDE 0.1 % EX CREA: TOPICAL_CREAM | CUTANEOUS | Status: DC

## 2012-12-27 NOTE — Telephone Encounter (Signed)
rx sent to pharmacy by e-script  

## 2012-12-27 NOTE — Telephone Encounter (Signed)
[facility] request refill Triamcinolone Cream. [facility] so [facility] no [facility] of med or instructions for use.Please advise.

## 2013-01-27 ENCOUNTER — Ambulatory Visit (INDEPENDENT_AMBULATORY_CARE_PROVIDER_SITE_OTHER): Payer: Medicare Other | Admitting: Internal Medicine

## 2013-01-27 ENCOUNTER — Encounter: Payer: Self-pay | Admitting: Internal Medicine

## 2013-01-27 VITALS — BP 148/80 | HR 75 | Temp 97.9°F | Wt 171.0 lb

## 2013-01-27 DIAGNOSIS — I10 Essential (primary) hypertension: Secondary | ICD-10-CM

## 2013-01-27 LAB — BASIC METABOLIC PANEL
Chloride: 94 mEq/L — ABNORMAL LOW (ref 96–112)
Creatinine, Ser: 1.1 mg/dL (ref 0.4–1.5)
GFR: 70.69 mL/min (ref >60.00)
Potassium: 4 mEq/L (ref 3.5–5.1)

## 2013-01-27 MED ORDER — LOSARTAN POTASSIUM 50 MG PO TABS: 50.0000 mg | ORAL_TABLET | Freq: Every day | ORAL | Status: DC

## 2013-01-27 NOTE — Assessment & Plan Note (Signed)
BP Readings from Last 3 Encounters:  01/27/13 148/80  12/02/12 132/70  09/02/12 140/78   BP has been okay I don't want to push therapy due to orthostasis risk with his Parkinson's Will continue the amlodipine and losartan Check met b today

## 2013-01-27 NOTE — Progress Notes (Signed)
Subjective:    Patient ID: Johnny Barnett, male    DOB: 05/23/1941, 72 y.o.   MRN: [id number]  HPI His history has finally been published Quite a relief He is pleased with the result  Has been checking his BP frequently of late Seems to be okay 148/90--116/67 No change in mild, chronic dizzy feeling Hands still feel cold "to the point of being numb" Hasn't been as active lately due to the weather No chest pain No SOB  Current Outpatient Prescriptions on File Prior to Visit  Medication Sig Dispense Refill  . amLODipine (NORVASC) 5 MG tablet Take 1 tablet (5 mg total) by mouth daily.  90 tablet  3  . aspirin 81 MG EC tablet Take 81 mg by mouth daily.        . carbidopa-levodopa (SINEMET) 25-100 MG per tablet Take 1 tablet by mouth 3 (three) times daily.        . Glucosamine-Chondroit-Vit C-Mn (GLUCOSAMINE CHONDR 1500 COMPLX) CAPS Take by mouth daily.        Marland Kitchen losartan (COZAAR) 50 MG tablet Take 1 tablet (50 mg total) by mouth daily.  30 tablet  11  . Multiple Vitamin (MULTIVITAMIN) tablet Take 1 tablet by mouth daily.        Marland Kitchen rOPINIRole (REQUIP) 0.25 MG tablet Take 0.75 mg by mouth 3 (three) times daily.       . sennosides-docusate sodium (SENOKOT-S) 8.6-50 MG tablet Take 2 tablets by mouth daily.      Marland Kitchen triamcinolone cream (KENALOG) 0.1 % APPLY TWICE A DAY AS NEEDED FOR ITCHY AREAS  30 g  6   No current facility-administered medications on file prior to visit.    No Known Allergies  Past Medical History  Diagnosis Date  . Hyperlipidemia   . ED (erectile dysfunction)   . Degenerative joint disease     Past Surgical History  Procedure Laterality Date  . Flexible sigmoidoscopy  08/05  . Sigmoid volvulus resection  10/13    Dr Michela Pitcher    Family History  Problem Relation Age of Onset  . Diabetes Paternal Uncle   . Coronary artery disease Neg Hx   . Hypertension Neg Hx     History   Social History  . Marital Status: Married    Spouse Name: N/A    Number of  Children: 2  . Years of Education: N/A   Occupational History  . 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 Smoking status: Never Smoker   . Smokeless tobacco: Never Used  . Alcohol Use: Yes     Comment: wine  . Drug Use: No  . Sexually Active: Not on file   Other Topics Concern  . 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 No distress.  Neck: Normal range of motion. Neck supple. No  thyromegaly present.  Cardiovascular: Normal rate, regular rhythm and normal heart sounds.  Exam reveals no gallop.   No murmur heard. Pulmonary/Chest: Effort normal and breath sounds normal. No respiratory distress. He has no wheezes. He has no rales.  Lymphadenopathy:    He has no cervical adenopathy.  Psychiatric: He has a normal mood and affect. His behavior is normal.          Assessment & Plan:

## 2013-06-10 ENCOUNTER — Ambulatory Visit (INDEPENDENT_AMBULATORY_CARE_PROVIDER_SITE_OTHER): Payer: Medicare Other | Admitting: Family Medicine

## 2013-06-10 ENCOUNTER — Encounter: Payer: Self-pay | Admitting: Family Medicine

## 2013-06-10 VITALS — BP 128/72 | HR 78 | Temp 98.3°F | Wt 168.5 lb

## 2013-06-10 DIAGNOSIS — R509 Fever, unspecified: Secondary | ICD-10-CM | POA: Insufficient documentation

## 2013-06-10 DIAGNOSIS — B079 Viral wart, unspecified: Secondary | ICD-10-CM

## 2013-06-10 NOTE — Patient Instructions (Addendum)
Drink plenty of fluids.  You can alternate tylenol and aspirin (with food) as needed.  This should gradually improve.  Take care.  Let us know if you have other concerns.  Dr Alphonsus Sias can take care of the wart if needed when you are feeling better.

## 2013-06-10 NOTE — Assessment & Plan Note (Signed)
We can remove this in the future if needed.  Covered with bandaid.  Doesn't appear infected.  Would get the fever resolved before intervention.

## 2013-06-10 NOTE — Progress Notes (Signed)
Wart present chronically on on R buttock, has gotten irritated, had bled some.    Yesterday he felt fatigued, more than typical.  He had a fever yesterday, "like I had a cold coming on".  Took ASA twice in the last 24 hours with some relief. He doesn't feel hot now.  He has cough at baseline, not worse acutely.  Heart rate increased last night, up to 88 BPM.  This is atypical for him.   Some dec in appetite but no other focal sx.  No ST, ear pain, sputum, diarrhea, abd pain, rash, etc.   Meds, vitals, and allergies reviewed.   ROS: See HPI.  Otherwise, noncontributory.  GEN: nad, alert and oriented, flat facial expression HEENT: mucous membranes moist, tm wnl, nasal and OP exam wnl NECK: supple w/o LA CV: rrr.  PULM: ctab, no inc wob ABD: soft, +bs EXT: no edema SKIN: no acute rash but irritated warty lesion on R buttock.  No surrounding cellulitis.

## 2013-06-10 NOTE — Assessment & Plan Note (Signed)
Presumed, w/o focal source.  Mild inc in HR last night likely due to fever, along with dec in appetite and fatigue.  nonfocal exam.  Would follow clinically.  APAP/ASA alternating with fluid and rest.  D/w pt.  Nontoxic.  F/u prn.

## 2013-06-27 ENCOUNTER — Ambulatory Visit (INDEPENDENT_AMBULATORY_CARE_PROVIDER_SITE_OTHER): Payer: Medicare Other | Admitting: Internal Medicine

## 2013-06-27 ENCOUNTER — Encounter: Payer: Self-pay | Admitting: Internal Medicine

## 2013-06-27 VITALS — BP 120/70 | HR 77 | Temp 98.6°F | Wt 169.0 lb

## 2013-06-27 DIAGNOSIS — L723 Sebaceous cyst: Secondary | ICD-10-CM

## 2013-06-27 MED ORDER — FLUTICASONE PROPIONATE 50 MCG/ACT NA SUSP: 2.0000 | Freq: Every day | NASAL | Status: DC

## 2013-06-27 NOTE — Assessment & Plan Note (Signed)
This is not a surface lesion Discussed that this should be excised if bothersome He prefers to wait for his next appt before proceeding

## 2013-06-27 NOTE — Addendum Note (Signed)
Addended by: Tillman Abide I on: 06/27/2013 02:30 PM   Modules accepted: Orders

## 2013-06-27 NOTE — Progress Notes (Signed)
  Subjective:    Patient ID: Johnny Barnett, male    DOB: 05/06/1941, 72 y.o.   MRN: [id number]  HPI Would like lesion on buttock treated Ongoing discomfort-- goes back a year but now causing problems Did have bleeding 1 night  Current Outpatient Prescriptions on File Prior to Visit  Medication Sig Dispense Refill  . amLODipine (NORVASC) 5 MG tablet Take 1 tablet (5 mg total) by mouth daily.  90 tablet  3  . aspirin 81 MG EC tablet Take 81 mg by mouth daily.        . carbidopa-levodopa (SINEMET) 25-100 MG per tablet Take 1 tablet by mouth 3 (three) times daily.       . Glucosamine-Chondroit-Vit C-Mn (GLUCOSAMINE CHONDR 1500 COMPLX) CAPS Take by mouth daily.        Marland Kitchen losartan (COZAAR) 50 MG tablet Take 1 tablet (50 mg total) by mouth daily.  90 tablet  3  . Multiple Vitamin (MULTIVITAMIN) tablet Take 1 tablet by mouth daily.        Marland Kitchen rOPINIRole (REQUIP) 0.25 MG tablet Take 0.5 mg by mouth 3 (three) times daily.       . sennosides-docusate sodium (SENOKOT-S) 8.6-50 MG tablet Take 2 tablets by mouth daily.      Marland Kitchen triamcinolone cream (KENALOG) 0.1 % APPLY TWICE A DAY AS NEEDED FOR ITCHY AREAS  30 g  6   No current facility-administered medications on file prior to visit.    No Known Allergies  Past Medical History  Diagnosis Date  . Hyperlipidemia   . ED (erectile dysfunction)   . Degenerative joint disease     Past Surgical History  Procedure Laterality Date  . Flexible sigmoidoscopy  08/05  . Sigmoid volvulus resection  10/13    Dr Michela Pitcher    Family History  Problem Relation Age of Onset  . Diabetes Paternal Uncle   . Coronary artery disease Neg Hx   . Hypertension Neg Hx     History   Social History  . Marital Status: Married    Spouse Name: Johnny Barnett    Number of Children: 2  . Years of Education: Johnny Barnett   Occupational History  . 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 Smoking status: Never Smoker   . Smokeless tobacco: Never  Used  . Alcohol Use: Yes     Comment: wine  . Drug Use: No  . Sexual Activity: Not on file   Other Topics Concern  . 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 No distress.  Skin:  Small black eschar on right mid, medial buttock. Underlying ~2cm mass in skin  Firm but not inflamed or tender          Assessment & Plan:

## 2013-08-01 ENCOUNTER — Encounter: Payer: Self-pay | Admitting: Internal Medicine

## 2013-08-01 ENCOUNTER — Ambulatory Visit (INDEPENDENT_AMBULATORY_CARE_PROVIDER_SITE_OTHER): Payer: Medicare Other | Admitting: Internal Medicine

## 2013-08-01 VITALS — BP 148/80 | HR 82 | Temp 98.4°F | Ht 68.0 in | Wt 171.0 lb

## 2013-08-01 DIAGNOSIS — G20 Parkinson's disease: Secondary | ICD-10-CM

## 2013-08-01 DIAGNOSIS — I10 Essential (primary) hypertension: Secondary | ICD-10-CM

## 2013-08-01 DIAGNOSIS — E785 Hyperlipidemia, unspecified: Secondary | ICD-10-CM

## 2013-08-01 DIAGNOSIS — Z0000 Encounter for general adult medical examination without abnormal findings: Secondary | ICD-10-CM

## 2013-08-01 DIAGNOSIS — K5909 Other constipation: Secondary | ICD-10-CM

## 2013-08-01 NOTE — Progress Notes (Signed)
Subjective:    Patient ID: Johnny Barnett, male    DOB: [birth year], 72 y.o.   MRN: [id number]  HPI Here for physical Lesion on buttocks did go away Fluticasone is helping the sinus drainage---sleeps better now Dr Shanda Howells asked him to try lower ropinrole dose to see if it helped the "dizziness". 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 Will get his flu shot there  His primary concern is his eyesight--- fine for distance but trouble with reading Got new reading glasses but they don't work well (other than a computer screen)  Having "uncontrolled gas" Notes when walking ---small liquid stool with the gas Has been wearing pads Only intermittent Uses the stool softener and tries to drink a lot of water No sugarless gum or candies No lactose sensitive Discussed keeping himself clear with the meds (but it does sound like he empties)  Current Outpatient Prescriptions on File Prior to Visit  Medication Sig Dispense Refill  . amLODipine (NORVASC) 5 MG tablet Take 1 tablet (5 mg total) by mouth daily.  90 tablet  3  . aspirin 81 MG EC tablet Take 81 mg by mouth daily.        . carbidopa-levodopa (SINEMET) 25-100 MG per tablet Take 1 tablet by mouth 3 (three) times daily.       . fluticasone (FLONASE) 50 MCG/ACT nasal spray Place 2 sprays into the nose daily. In each nostril  16 g  12  . Glucosamine-Chondroit-Vit C-Mn (GLUCOSAMINE CHONDR 1500 COMPLX) CAPS Take by mouth daily.        Marland Kitchen losartan (COZAAR) 50 MG tablet Take 1 tablet (50 mg total) by mouth daily.  90 tablet  3  . Multiple Vitamin (MULTIVITAMIN) tablet Take 1 tablet by mouth daily.        Marland Kitchen rOPINIRole (REQUIP) 0.25 MG tablet Take 0.75 mg by mouth 3 (three) times daily.       . sennosides-docusate sodium (SENOKOT-S) 8.6-50 MG tablet Take 2 tablets by mouth daily.      Marland Kitchen  triamcinolone cream (KENALOG) 0.1 % APPLY TWICE A DAY AS NEEDED FOR ITCHY AREAS  30 g  6   No current facility-administered medications on file prior to visit.    No Known Allergies  Past Medical History  Diagnosis Date  . Hyperlipidemia   . ED (erectile dysfunction)   . Degenerative joint disease     Past Surgical History  Procedure Laterality Date  . Flexible sigmoidoscopy  08/05  . Sigmoid volvulus resection  10/13    Dr Michela Pitcher    Family History  Problem Relation Age of Onset  . Diabetes Paternal Uncle   . Coronary artery disease Neg Hx   . Hypertension Neg Hx     History   Social History  . Marital Status: Married    Spouse Name: N/A    Number of Children: 2  . Years of Education: N/A   Occupational History  . 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 Smoking status: Never Smoker   . Smokeless tobacco: Never Used  . Alcohol Use: Yes     Comment: wine  . Drug Use: No  . Sexual Activity: Not on file   Other Topics Concern  . 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       Wears seat belt  HENT: Positive for hearing loss and congestion. Negative for rhinorrhea, dental problem and tinnitus.        Regular with dentist Mild hearing problems  Eyes: Negative for visual disturbance.       Discussed reading problems  Respiratory: Negative for cough, chest tightness and shortness of breath.   Cardiovascular: Negative for chest pain, palpitations and leg swelling.  Gastrointestinal: Positive for  constipation. Negative for nausea, vomiting, abdominal pain and blood in stool.       Indigestion with greasy foods---doesn't need meds  Endocrine: Negative for cold intolerance and heat intolerance.  Genitourinary: Positive for frequency. Negative for urgency.       Nocturia x 2 or so No daytime problems No sex--no problem  Musculoskeletal: Positive for arthralgias. Negative for back pain and joint swelling.       Sore right ankle--may have twisted it  Skin: Negative for rash.       No suspicious lesions  Allergic/Immunologic: Negative for environmental allergies and immunocompromised state.  Neurological: Positive for dizziness and weakness. Negative for syncope, light-headedness, numbness and headaches.       Gets muscle weakness/fatigue--relates to Parkinson's  Hematological: Negative for adenopathy. Does not bruise/bleed easily.  Psychiatric/Behavioral: Negative for sleep disturbance and dysphoric mood. The patient is not nervous/anxious.        Objective:   Physical Exam  Constitutional: He is oriented to person, place, and time. He appears well-developed and well-nourished. No distress.  HENT:  Head: Normocephalic and atraumatic.  Right Ear: External ear normal.  Left Ear: External ear normal.  Mouth/Throat: Oropharynx is clear and moist. No oropharyngeal exudate.  Eyes: Conjunctivae and EOM are normal. Pupils are equal, round, and reactive to light.  Neck: Normal range of motion. Neck supple. No thyromegaly present.  Cardiovascular: Normal rate, regular rhythm, normal heart sounds and intact distal pulses.  Exam reveals no gallop.   No murmur heard. Pulmonary/Chest: Effort normal and breath sounds normal. No respiratory distress. He has no wheezes. He has no rales.  Abdominal: Soft. There is no tenderness.  Musculoskeletal: He exhibits no edema and no tenderness.  Lymphadenopathy:    He has no cervical adenopathy.  Neurological: He is alert and oriented to person, place,  and time.  Mild bradykinesia  Skin: No rash noted. No erythema.  Psychiatric: He has a normal mood and affect. His behavior is normal.          Assessment & Plan:

## 2013-08-01 NOTE — Assessment & Plan Note (Signed)
More bradykinesia than anything now Went back up on his ropinrole Dr Shanda Howells directs this care

## 2013-08-01 NOTE — Assessment & Plan Note (Signed)
May need to decrease dose given some mild trouble with gas and leakage

## 2013-08-01 NOTE — Assessment & Plan Note (Signed)
Healthy UTD with imms and colonoscopy No PSA due to age

## 2013-08-01 NOTE — Assessment & Plan Note (Signed)
Discussed primary prevention Will check levels but no Rx

## 2013-08-01 NOTE — Assessment & Plan Note (Signed)
BP Readings from Last 3 Encounters:  08/01/13 148/80  06/27/13 120/70  06/10/13 128/72   Good control for now Given the Parkinson's, would not be overly aggressive

## 2013-08-02 LAB — CBC WITH DIFFERENTIAL/PLATELET
Basophils Absolute: 0 10*3/uL (ref 0.0–0.1)
Basophils Relative: 0.4 % (ref 0.0–3.0)
Eosinophils Relative: 6 % — ABNORMAL HIGH (ref 0.0–5.0)
HCT: 41.8 % (ref 39.0–52.0)
Hemoglobin: 14.3 g/dL (ref 13.0–17.0)
Lymphocytes Relative: 21.1 % (ref 12.0–46.0)
Lymphs Abs: 1.4 10*3/uL (ref 0.7–4.0)
Monocytes Relative: 7 % (ref 3.0–12.0)
Platelets: 235 10*3/uL (ref 150.0–400.0)
WBC: 6.9 10*3/uL (ref 4.5–10.5)

## 2013-08-02 LAB — HEPATIC FUNCTION PANEL
AST: 17 U/L (ref 0–37)
Albumin: 4.2 g/dL (ref 3.5–5.2)
Alkaline Phosphatase: 47 U/L (ref 39–117)
Bilirubin, Direct: 0.1 mg/dL (ref 0.0–0.3)

## 2013-08-02 LAB — LIPID PANEL
HDL: 40.3 mg/dL (ref >39.00)
LDL Cholesterol: 107 mg/dL — ABNORMAL HIGH (ref 0–99)
Total CHOL/HDL Ratio: 5
VLDL: 39 mg/dL (ref 0.0–40.0)

## 2013-08-02 LAB — TSH: TSH: 2.86 u[IU]/mL (ref 0.35–5.50)

## 2013-08-02 LAB — BASIC METABOLIC PANEL
GFR: 84.77 mL/min (ref >60.00)
Glucose, Bld: 90 mg/dL (ref 70–99)
Potassium: 4.3 mEq/L (ref 3.5–5.1)
Sodium: 131 mEq/L — ABNORMAL LOW (ref 135–145)

## 2013-08-06 ENCOUNTER — Other Ambulatory Visit: Payer: Self-pay | Admitting: Internal Medicine

## 2013-09-15 ENCOUNTER — Other Ambulatory Visit: Payer: Self-pay

## 2013-11-14 IMAGING — CR DG CHEST 1V PORT
1 series · 2 of 2 positions shown · non-contrast
Comparison: none

REASON FOR EXAM: Chest Pain
COMMENTS:

PROCEDURE:     DXR - DXR PORTABLE CHEST SINGLE VIEW  - August 02, 2012  [DATE]
RESULT:     Comparison: None

[Series 1: ap · 0.17mm/px · 2 of 2 slices shown]
[im 1/2]
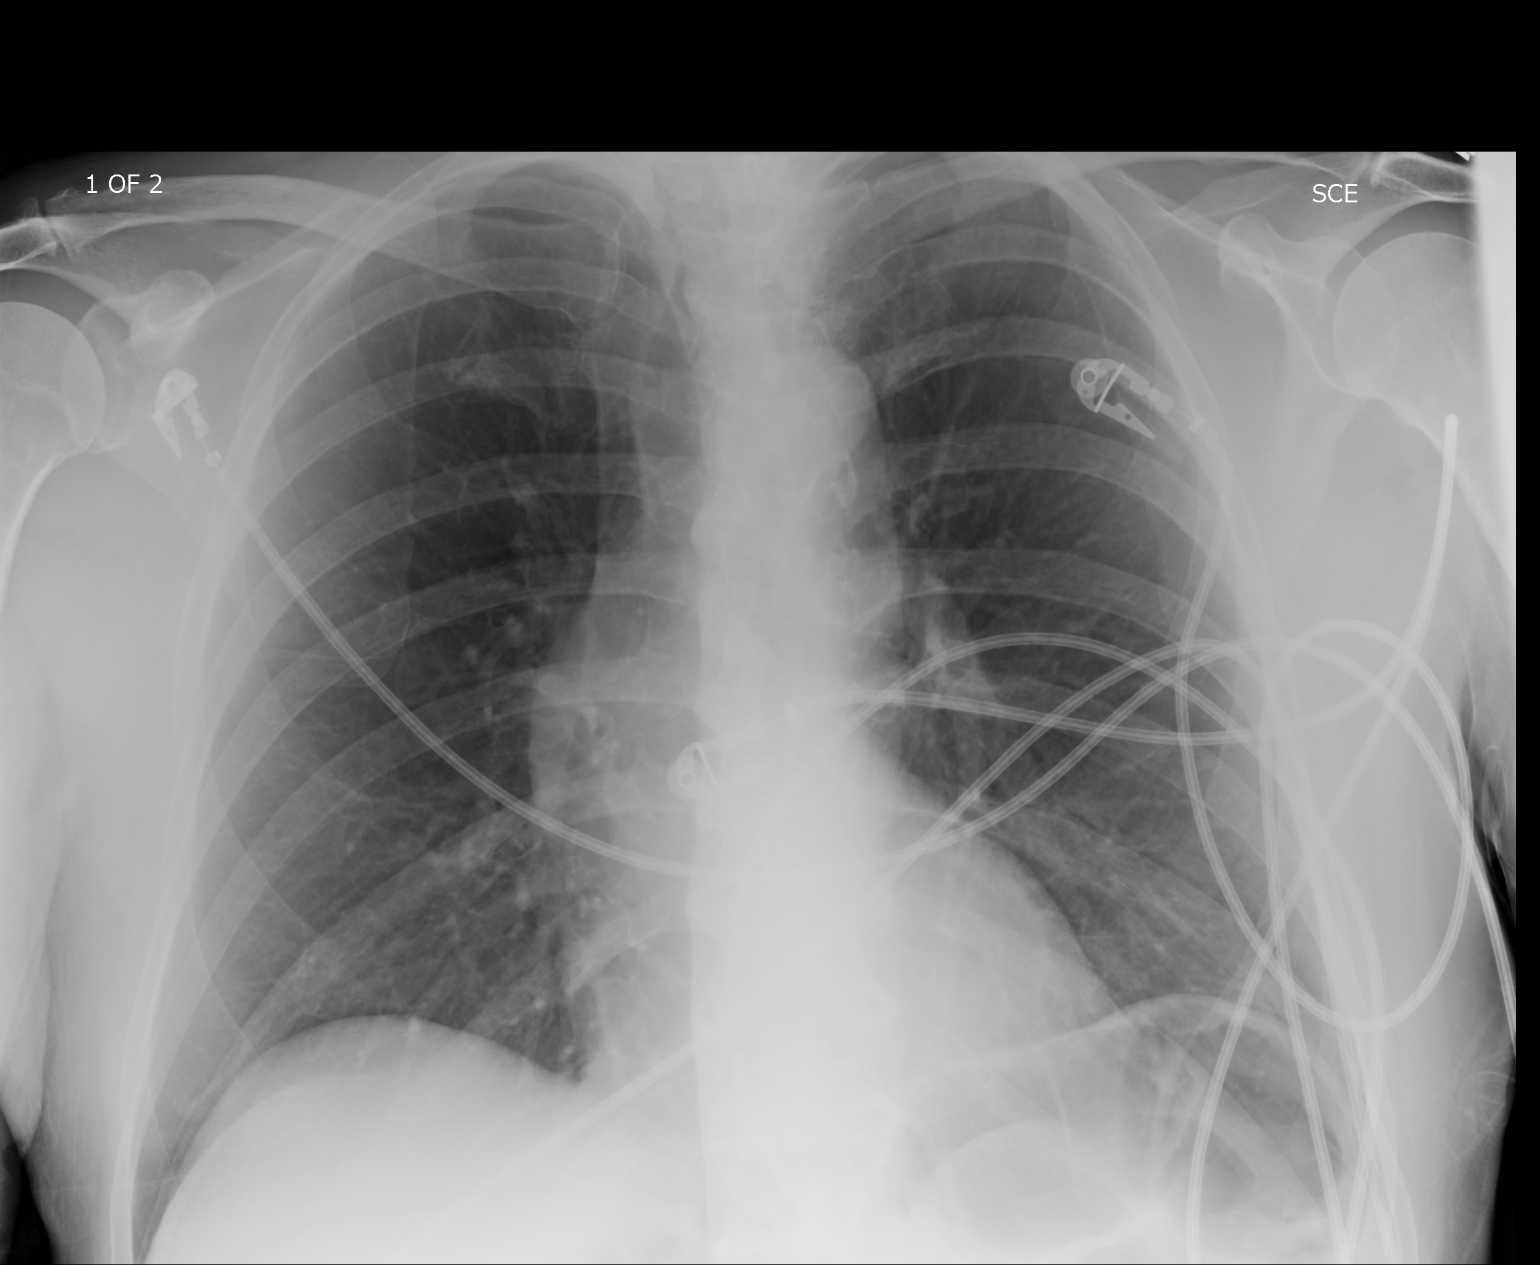
[im 2/2]
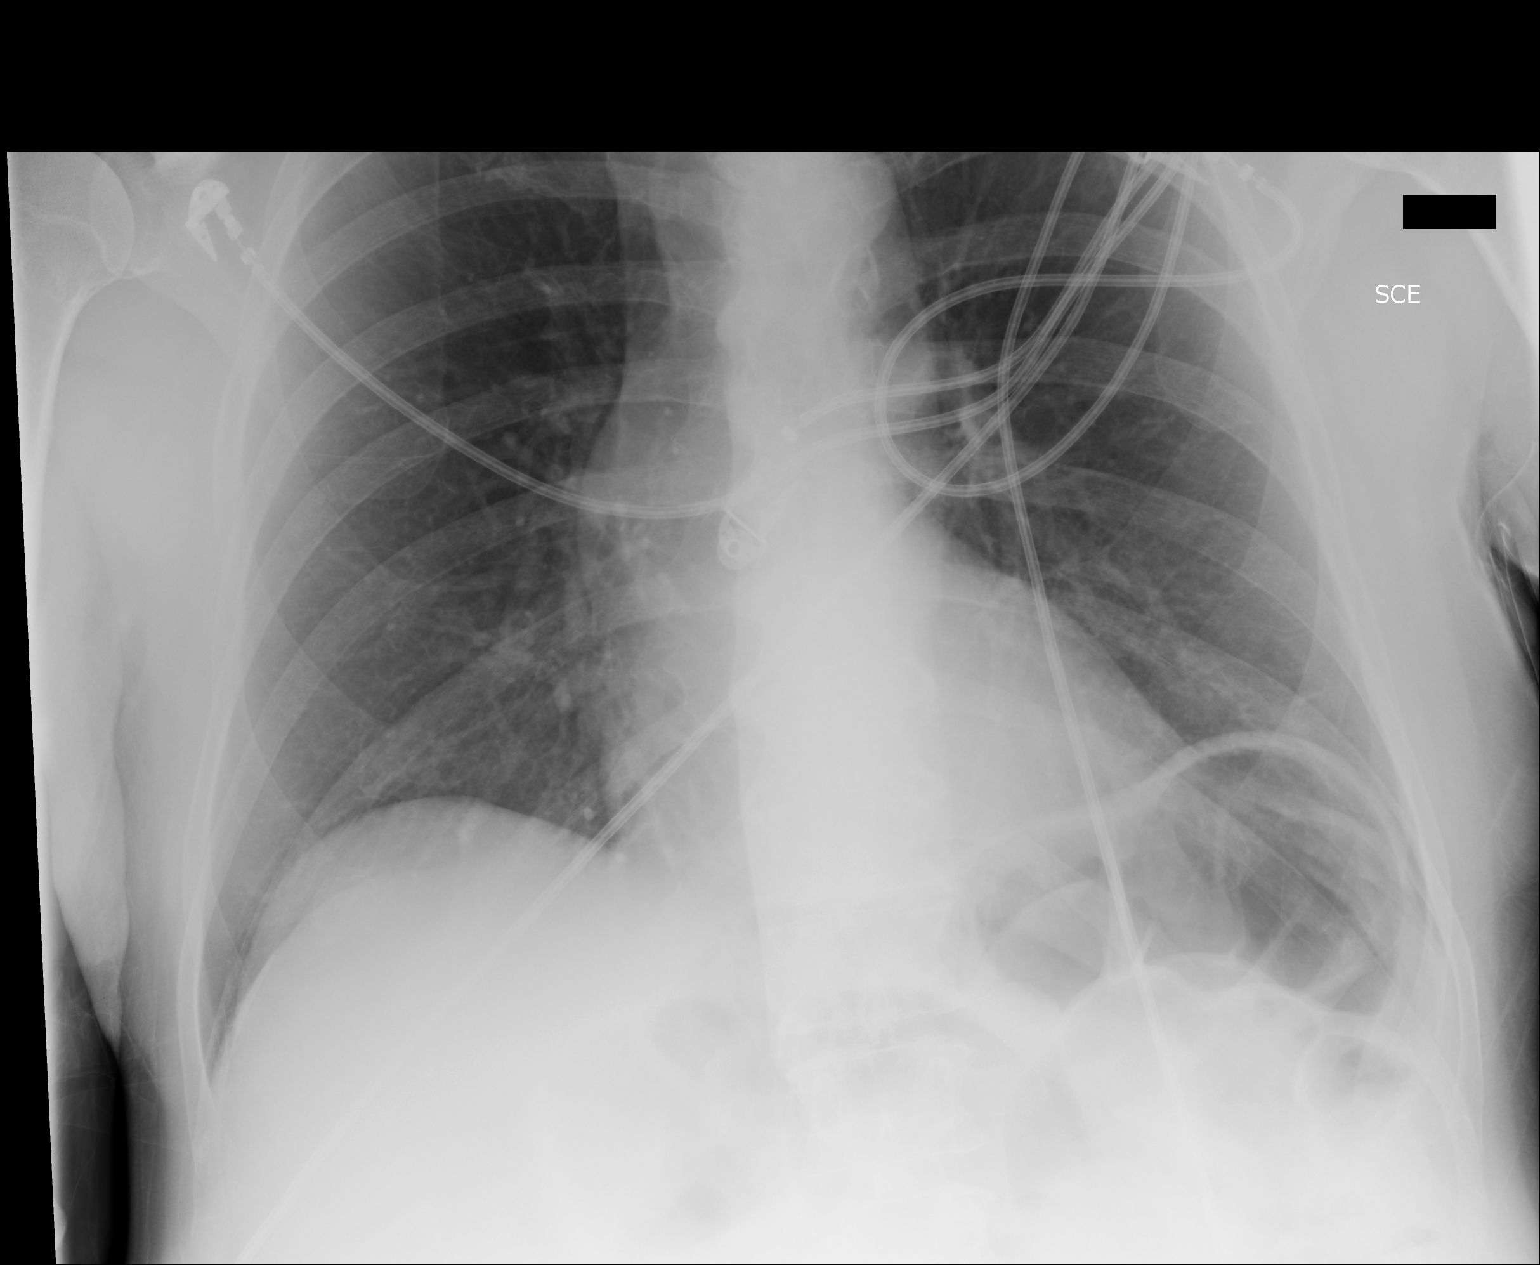

[2 of 2 positions shown; findings below may reference images not displayed]

FINDINGS: Single portable AP chest radiograph is provided.  There is no focal
parenchymal opacity, pleural effusion, or pneumothorax. Normal
cardiomediastinal silhouette. The osseous structures are unremarkable.
IMPRESSION: No acute disease of the che[REDACTED]

## 2013-11-18 ENCOUNTER — Encounter: Payer: Self-pay | Admitting: Internal Medicine

## 2013-11-18 ENCOUNTER — Ambulatory Visit (INDEPENDENT_AMBULATORY_CARE_PROVIDER_SITE_OTHER): Payer: Medicare Other | Admitting: Internal Medicine

## 2013-11-18 VITALS — BP 138/70 | HR 73 | Temp 98.6°F | Wt 166.0 lb

## 2013-11-18 DIAGNOSIS — H6123 Impacted cerumen, bilateral: Secondary | ICD-10-CM

## 2013-11-18 DIAGNOSIS — H6120 Impacted cerumen, unspecified ear: Secondary | ICD-10-CM

## 2013-11-18 NOTE — Progress Notes (Signed)
Subjective:    Patient ID: Johnny Barnett, male    DOB: 12/10/1940, 73 y.o.   MRN: [id number]  HPI He thinks he has wax in his ears Chronic dizziness--may be slightly worse Now notices an echo in his hearing---a fullness Not true ringing  No fever  Current Outpatient Prescriptions on File Prior to Visit  Medication Sig Dispense Refill  . amLODipine (NORVASC) 5 MG tablet TAKE 1 TABLET DAILY  90 tablet  2  . aspirin 81 MG EC tablet Take 81 mg by mouth daily.        . carbidopa-levodopa (SINEMET) 25-100 MG per tablet Take 1 tablet by mouth 3 (three) times daily.       . fluticasone (FLONASE) 50 MCG/ACT nasal spray Place 2 sprays into the nose daily. In each nostril  16 g  12  . Glucosamine-Chondroit-Vit C-Mn (GLUCOSAMINE CHONDR 1500 COMPLX) CAPS Take by mouth daily.        Marland Kitchen. losartan (COZAAR) 50 MG tablet Take 1 tablet (50 mg total) by mouth daily.  90 tablet  3  . Multiple Vitamin (MULTIVITAMIN) tablet Take 1 tablet by mouth daily.        Marland Kitchen. rOPINIRole (REQUIP) 0.25 MG tablet Take 0.75 mg by mouth 3 (three) times daily.       . sennosides-docusate sodium (SENOKOT-S) 8.6-50 MG tablet Take 2 tablets by mouth daily.      Marland Kitchen. triamcinolone cream (KENALOG) 0.1 % APPLY TWICE A DAY AS NEEDED FOR ITCHY AREAS  30 g  6   No current facility-administered medications on file prior to visit.    No Known Allergies  Past Medical History  Diagnosis Date  . Hyperlipidemia   . ED (erectile dysfunction)   . Degenerative joint disease     Past Surgical History  Procedure Laterality Date  . Flexible sigmoidoscopy  08/05  . Sigmoid volvulus resection  10/13    Dr Michela PitcherEly    Family History  Problem Relation Age of Onset  . Diabetes Paternal Uncle   . Coronary artery disease Neg Hx   . Hypertension Neg Hx     History   Social History  . Marital Status: Married    Spouse Name: N/A    Number of Children: 2  . Years of Education: N/A   Occupational History  . 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 Smoking status: Never Smoker   . Smokeless tobacco: Never Used  . Alcohol Use: Yes     Comment: wine  . Drug Use: No  . Sexual Activity: Not on file   Other Topics Concern  . 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 GI illness No recent respiratory illness    Objective:   Physical Exam  Constitutional: He appears well-developed and well-nourished. No distress.  HENT:  No tragal tenderness Both canals have cerumen deep in canal towards the TM--seems to block TMs  Assessment & Plan:

## 2013-11-18 NOTE — Progress Notes (Signed)
Pre-visit discussion using our clinic review tool. No additional management support is needed unless otherwise documented below in the visit note.  

## 2013-11-18 NOTE — Assessment & Plan Note (Signed)
Cleared with lavage---TMs fully visible Symptoms better (though some irritation from the lavage) No further action needed

## 2013-11-22 ENCOUNTER — Ambulatory Visit (INDEPENDENT_AMBULATORY_CARE_PROVIDER_SITE_OTHER): Payer: Medicare Other | Admitting: Family Medicine

## 2013-11-22 ENCOUNTER — Encounter: Payer: Self-pay | Admitting: Family Medicine

## 2013-11-22 VITALS — BP 148/82 | HR 95 | Temp 98.6°F | Ht 68.0 in | Wt 168.0 lb

## 2013-11-22 DIAGNOSIS — J111 Influenza due to unidentified influenza virus with other respiratory manifestations: Secondary | ICD-10-CM

## 2013-11-22 DIAGNOSIS — J101 Influenza due to other identified influenza virus with other respiratory manifestations: Secondary | ICD-10-CM

## 2013-11-22 MED ORDER — GUAIFENESIN-CODEINE 100-10 MG/5ML PO SYRP: 5.0000 mL | ORAL_SOLUTION | Freq: Three times a day (TID) | ORAL | Status: DC | PRN

## 2013-11-22 MED ORDER — OSELTAMIVIR PHOSPHATE 75 MG PO CAPS: 75.0000 mg | ORAL_CAPSULE | Freq: Two times a day (BID) | ORAL | Status: DC

## 2013-11-22 NOTE — Progress Notes (Signed)
Pre-visit discussion using our clinic review tool. No additional management support is needed unless otherwise documented below in the visit note.  

## 2013-11-22 NOTE — Patient Instructions (Signed)
Drink lots of fluids and rest  Take the tamiflu as directed  Cough medicine with caution- it may sedate Update if not starting to improve in a week or if worsening    Influenza, Adult Influenza ("the flu") is a viral infection of the respiratory tract. It occurs more often in winter months because people spend more time in close contact with one another. Influenza can make you feel very sick. Influenza easily spreads from person to person (contagious). CAUSES  Influenza is caused by a virus that infects the respiratory tract. You can catch the virus by breathing in droplets from an infected person's cough or sneeze. You can also catch the virus by touching something that was recently contaminated with the virus and then touching your mouth, nose, or eyes. SYMPTOMS  Symptoms typically last 4 to 10 days and may include:  Fever.  Chills.  Headache, body aches, and muscle aches.  Sore throat.  Chest discomfort and cough.  Poor appetite.  Weakness or feeling tired.  Dizziness.  Nausea or vomiting. DIAGNOSIS  Diagnosis of influenza is often made based on your history and a physical exam. A nose or throat swab test can be done to confirm the diagnosis. RISKS AND COMPLICATIONS You may be at risk for a more severe case of influenza if you smoke cigarettes, have diabetes, have chronic heart disease (such as heart failure) or lung disease (such as asthma), or if you have a weakened immune system. Elderly people and pregnant women are also at risk for more serious infections. The most common complication of influenza is a lung infection (pneumonia). Sometimes, this complication can require emergency medical care and may be life-threatening. PREVENTION  An annual influenza vaccination (flu shot) is the best way to avoid getting influenza. An annual flu shot is now routinely recommended for all adults in the [place] TREATMENT  In mild cases, influenza goes away on its own. Treatment is directed at  relieving symptoms. For more severe cases, your caregiver may prescribe antiviral medicines to shorten the sickness. Antibiotic medicines are not effective, because the infection is caused by a virus, not by bacteria. HOME CARE INSTRUCTIONS  Only take over-the-counter or prescription medicines for pain, discomfort, or fever as directed by your caregiver.  Use a cool mist humidifier to make breathing easier.  Get plenty of rest until your temperature returns to normal. This usually takes 3 to 4 days.  Drink enough fluids to keep your urine clear or pale yellow.  Cover your mouth and nose when coughing or sneezing, and wash your hands well to avoid spreading the virus.  Stay home from work or school until your fever has been gone for at least 1 full day. SEEK MEDICAL CARE IF:   You have chest pain or a deep cough that worsens or produces more mucus.  You have nausea, vomiting, or diarrhea. SEEK IMMEDIATE MEDICAL CARE IF:   You have difficulty breathing, shortness of breath, or your skin or nails turn bluish.  You have severe neck pain or stiffness.  You have a severe headache, facial pain, or earache.  You have a worsening or recurring fever.  You have nausea or vomiting that cannot be controlled. MAKE SURE YOU:  Understand these instructions.  Will watch your condition.  Will get help right away if you are not doing well or get worse. Document Released: 10/24/2000 Document Revised: 04/27/2012 Document Reviewed: 01/26/2012 [facility] Patient Information 2014 [employer] [place].

## 2013-11-22 NOTE — Progress Notes (Signed)
Subjective:    Patient ID: Johnny Barnett, male    DOB: 1941/09/08, 73 y.o.   MRN: [id number]  HPI Here with symptoms of cough and fever   Yesterday - suddenly developed a bad cough -- dry / hacking  A little rattling  Headaches - all over head/not just facial Also sinuses are tight and congested  Feeling under the weather  Fever through the night - takes tylenol every 4-6 hours  Was - chilled and aches last night   No n/v  Just a little uneasy in stomach and no appetite  Had a flu shot this fall   Patient Active Problem List   Diagnosis Date Noted  . Impacted cerumen 11/18/2013  . Actinic keratosis 12/02/2012  . Routine general medical examination at a health care facility 07/23/2012  . Other constipation 09/09/2011  . Parkinson disease 07/07/2011  . HTN (hypertension) 05/30/2011  . Memory loss 10/16/2010  . HYPERLIPIDEMIA 05/17/2007  . ERECTILE DYSFUNCTION 05/17/2007  . DEGENERATIVE JOINT DISEASE 05/17/2007   Past Medical History  Diagnosis Date  . Hyperlipidemia   . ED (erectile dysfunction)   . Degenerative joint disease    Past Surgical History  Procedure Laterality Date  . Flexible sigmoidoscopy  08/05  . Sigmoid volvulus resection  10/13    Dr Michela Pitcher   History  Substance Use Topics  . Smoking status: Never Smoker   . Smokeless tobacco: Never Used  . Alcohol Use: Yes     Comment: wine   Family History  Problem Relation Age of Onset  . Diabetes Paternal Uncle   . Coronary artery disease Neg Hx   . Hypertension Neg Hx    No Known Allergies Current Outpatient Prescriptions on File Prior to Visit  Medication Sig Dispense Refill  . amLODipine (NORVASC) 5 MG tablet TAKE 1 TABLET DAILY  90 tablet  2  . aspirin 81 MG EC tablet Take 81 mg by mouth daily.        . carbidopa-levodopa (SINEMET) 25-100 MG per tablet Take 1 tablet by mouth 3 (three) times daily.       . fluticasone (FLONASE) 50 MCG/ACT nasal spray Place 2 sprays into the nose daily. In each  nostril  16 g  12  . Glucosamine-Chondroit-Vit C-Mn (GLUCOSAMINE CHONDR 1500 COMPLX) CAPS Take by mouth daily.        Marland Kitchen losartan (COZAAR) 50 MG tablet Take 1 tablet (50 mg total) by mouth daily.  90 tablet  3  . Multiple Vitamin (MULTIVITAMIN) tablet Take 1 tablet by mouth daily.        Marland Kitchen rOPINIRole (REQUIP) 0.25 MG tablet Take 0.75 mg by mouth 3 (three) times daily.       . sennosides-docusate sodium (SENOKOT-S) 8.6-50 MG tablet Take 2 tablets by mouth daily.      Marland Kitchen triamcinolone cream (KENALOG) 0.1 % APPLY TWICE A DAY AS NEEDED FOR ITCHY AREAS  30 g  6   No current facility-administered medications on file prior to visit.     Review of Systems Review of Systems  Constitutional: pos for fever/ aches and malaise  Eyes: Negative for pain and visual disturbance. ENT pos for cong and rhinorrhea and st  Respiratory: Negative for wheeze  and shortness of breath.   Cardiovascular: Negative for cp or palpitations    Gastrointestinal: Negative for nausea, diarrhea and constipation.  Genitourinary: Negative for urgency and frequency.  Skin: Negative for pallor or rash   Neurological: Negative for weakness, light-headedness, numbness and  headaches.  Hematological: Negative for adenopathy. Does not bruise/bleed easily.  Psychiatric/Behavioral: Negative for dysphoric mood. The patient is not nervous/anxious.         Objective:   Physical Exam  Constitutional: He appears well-developed and well-nourished. No distress.  HENT:  Head: Normocephalic and atraumatic.  Right Ear: External ear normal.  Left Ear: External ear normal.  Mouth/Throat: Oropharynx is clear and moist. No oropharyngeal exudate.  Nares are injected and congested  No sinus tenderness Clear rhinorrhea   Eyes: Conjunctivae and EOM are normal. Pupils are equal, round, and reactive to light. Right eye exhibits no discharge. Left eye exhibits no discharge.  Neck: Normal range of motion. Neck supple.  Cardiovascular: Regular  rhythm.   Pulmonary/Chest: Effort normal and breath sounds normal. No respiratory distress. He has no wheezes. He has no rales.  Lymphadenopathy:    He has no cervical adenopathy.  Neurological:  Bradykinesia  Skin: Skin is warm and dry. No rash noted. No pallor.  Psychiatric: He has a normal mood and affect.          Assessment & Plan:

## 2013-11-24 NOTE — Assessment & Plan Note (Addendum)
Cover with tamiflu-disc benefits Disc symptomatic care - see instructions on AVS  Handout given  Update if not starting to improve in a week or if worsening

## 2014-01-15 ENCOUNTER — Other Ambulatory Visit: Payer: Self-pay | Admitting: Internal Medicine

## 2014-01-30 ENCOUNTER — Ambulatory Visit (INDEPENDENT_AMBULATORY_CARE_PROVIDER_SITE_OTHER): Payer: Medicare Other | Admitting: Internal Medicine

## 2014-01-30 ENCOUNTER — Encounter: Payer: Self-pay | Admitting: Internal Medicine

## 2014-01-30 VITALS — BP 138/80 | HR 68 | Temp 98.0°F | Wt 167.0 lb

## 2014-01-30 DIAGNOSIS — R200 Anesthesia of skin: Secondary | ICD-10-CM

## 2014-01-30 DIAGNOSIS — L723 Sebaceous cyst: Secondary | ICD-10-CM

## 2014-01-30 DIAGNOSIS — R209 Unspecified disturbances of skin sensation: Secondary | ICD-10-CM

## 2014-01-30 LAB — VITAMIN B12: Vitamin B-12: 506 pg/mL (ref 211–911)

## 2014-01-30 NOTE — Progress Notes (Signed)
Subjective:    Patient ID: Johnny Barnett, male    DOB: 1941/03/27, 73 y.o.   MRN: [id number]  HPI Has recurrence of the right boil he had on his right hip Reviewed notes---I thought it was a cyst  It had gone away after last August Now feels tight again for 2 weeks Not red Some tenderness and itching  Current Outpatient Prescriptions on File Prior to Visit  Medication Sig Dispense Refill  . amLODipine (NORVASC) 5 MG tablet TAKE 1 TABLET DAILY  90 tablet  2  . aspirin 81 MG EC tablet Take 81 mg by mouth daily.        . carbidopa-levodopa (SINEMET) 25-100 MG per tablet Take 1 tablet by mouth 3 (three) times daily.       . Carbidopa-Levodopa ER (SINEMET CR) 25-100 MG tablet controlled release Take 1 tablet by mouth at bedtime.      . fluticasone (FLONASE) 50 MCG/ACT nasal spray Place 2 sprays into the nose daily. In each nostril  16 g  12  . Glucosamine-Chondroit-Vit C-Mn (GLUCOSAMINE CHONDR 1500 COMPLX) CAPS Take by mouth daily.        Marland Kitchen losartan (COZAAR) 50 MG tablet TAKE 1 TABLET (50 MG TOTAL) BY MOUTH DAILY.  90 tablet  3  . Multiple Vitamin (MULTIVITAMIN) tablet Take 1 tablet by mouth daily.        Marland Kitchen rOPINIRole (REQUIP) 0.25 MG tablet Take 0.75 mg by mouth 3 (three) times daily.       . sennosides-docusate sodium (SENOKOT-S) 8.6-50 MG tablet Take 2 tablets by mouth daily.      Marland Kitchen triamcinolone cream (KENALOG) 0.1 % APPLY TWICE A DAY AS NEEDED FOR ITCHY AREAS  30 g  6   No current facility-administered medications on file prior to visit.    No Known Allergies  Past Medical History  Diagnosis Date  . Hyperlipidemia   . ED (erectile dysfunction)   . Degenerative joint disease     Past Surgical History  Procedure Laterality Date  . Flexible sigmoidoscopy  08/05  . Sigmoid volvulus resection  10/13    Dr Michela Pitcher    Family History  Problem Relation Age of Onset  . Diabetes Paternal Uncle   . Coronary artery disease Neg Hx   . Hypertension Neg Hx     History   Social  History  . Marital Status: Married    Spouse Name: N/A    Number of Children: 2  . Years of Education: N/A   Occupational History  . 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 Smoking status: Never Smoker   . Smokeless tobacco: Never Used  . Alcohol Use: Yes     Comment: wine  . Drug Use: No  . Sexual Activity: Not on file   Other Topics Concern  . 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 Normal radial and ulnar pulses  Skin:  ~3.5 x 2cm cyst with induration. Crusted open area but no drainage now. Not warm and not tender          Assessment & Plan:

## 2014-01-30 NOTE — Assessment & Plan Note (Signed)
On right buttock Looks like it was infected then drained Currently indurated but not tender Discussed options Will wait for now---if persists or recurs, will refer to surgeon

## 2014-01-30 NOTE — Assessment & Plan Note (Signed)
Numbness, pain, cold hands I suspect this is autonomic neuropathy  Will just check B12 again

## 2014-01-31 ENCOUNTER — Other Ambulatory Visit: Payer: Self-pay | Admitting: Internal Medicine

## 2014-05-02 ENCOUNTER — Other Ambulatory Visit: Payer: Self-pay | Admitting: Internal Medicine

## 2014-06-01 ENCOUNTER — Ambulatory Visit: Payer: Self-pay | Admitting: Ophthalmology

## 2014-06-13 ENCOUNTER — Ambulatory Visit: Payer: Self-pay | Admitting: Ophthalmology

## 2014-06-27 ENCOUNTER — Ambulatory Visit: Payer: Self-pay | Admitting: Ophthalmology

## 2014-07-23 ENCOUNTER — Other Ambulatory Visit: Payer: Self-pay | Admitting: Internal Medicine

## 2014-08-02 ENCOUNTER — Encounter: Payer: Self-pay | Admitting: Internal Medicine

## 2014-08-02 ENCOUNTER — Ambulatory Visit (INDEPENDENT_AMBULATORY_CARE_PROVIDER_SITE_OTHER): Payer: Medicare Other | Admitting: Internal Medicine

## 2014-08-02 VITALS — BP 103/55 | HR 65 | Temp 98.5°F | Ht 68.0 in | Wt 161.0 lb

## 2014-08-02 DIAGNOSIS — G20 Parkinson's disease: Secondary | ICD-10-CM

## 2014-08-02 DIAGNOSIS — E785 Hyperlipidemia, unspecified: Secondary | ICD-10-CM

## 2014-08-02 DIAGNOSIS — Z0000 Encounter for general adult medical examination without abnormal findings: Secondary | ICD-10-CM

## 2014-08-02 DIAGNOSIS — I10 Essential (primary) hypertension: Secondary | ICD-10-CM

## 2014-08-02 LAB — CBC WITH DIFFERENTIAL/PLATELET
BASOS PCT: 0.6 % (ref 0.0–3.0)
Basophils Absolute: 0 10*3/uL (ref 0.0–0.1)
Eosinophils Absolute: 0.3 10*3/uL (ref 0.0–0.7)
Eosinophils Relative: 5.3 % — ABNORMAL HIGH (ref 0.0–5.0)
HCT: 41.3 % (ref 39.0–52.0)
HEMOGLOBIN: 14.1 g/dL (ref 13.0–17.0)
LYMPHS PCT: 17.6 % (ref 12.0–46.0)
Lymphs Abs: 1.1 10*3/uL (ref 0.7–4.0)
MCHC: 34.2 g/dL (ref 30.0–36.0)
MCV: 96.1 fl (ref 78.0–100.0)
Monocytes Absolute: 0.4 10*3/uL (ref 0.1–1.0)
Monocytes Relative: 7.1 % (ref 3.0–12.0)
NEUTROS ABS: 4.2 10*3/uL (ref 1.4–7.7)
Neutrophils Relative %: 69.4 % (ref 43.0–77.0)
Platelets: 231 10*3/uL (ref 150.0–400.0)
RBC: 4.29 Mil/uL (ref 4.22–5.81)
RDW: 13.1 % (ref 11.5–15.5)
WBC: 6.1 10*3/uL (ref 4.0–10.5)

## 2014-08-02 LAB — LIPID PANEL
Cholesterol: 183 mg/dL (ref 0–200)
HDL: 39.6 mg/dL (ref >39.00)
LDL CALC: 124 mg/dL — AB (ref 0–99)
NonHDL: 143.4
TRIGLYCERIDES: 99 mg/dL (ref 0.0–149.0)
Total CHOL/HDL Ratio: 5
VLDL: 19.8 mg/dL (ref 0.0–40.0)

## 2014-08-02 LAB — COMPREHENSIVE METABOLIC PANEL
ALT: 9 U/L (ref 0–53)
AST: 15 U/L (ref 0–37)
Albumin: 3.9 g/dL (ref 3.5–5.2)
Alkaline Phosphatase: 53 U/L (ref 39–117)
BILIRUBIN TOTAL: 0.3 mg/dL (ref 0.2–1.2)
BUN: 20 mg/dL (ref 6–23)
CALCIUM: 9 mg/dL (ref 8.4–10.5)
CHLORIDE: 94 meq/L — AB (ref 96–112)
CO2: 31 mEq/L (ref 19–32)
CREATININE: 1 mg/dL (ref 0.4–1.5)
GFR: 74.31 mL/min (ref >60.00)
Glucose, Bld: 65 mg/dL — ABNORMAL LOW (ref 70–99)
Potassium: 4.3 mEq/L (ref 3.5–5.1)
Sodium: 130 mEq/L — ABNORMAL LOW (ref 135–145)
Total Protein: 7.7 g/dL (ref 6.0–8.3)

## 2014-08-02 LAB — T4, FREE: Free T4: 0.91 ng/dL (ref 0.60–1.60)

## 2014-08-02 NOTE — Progress Notes (Signed)
Subjective:    Patient ID: Johnny Barnett, male    DOB: 08/05/1941, 73 y.o.   MRN: [id number]  HPI Here for physical Wife is with him Reviewed his wellness form No falls despite the Parkinson's No depression or anhedonia Reviewed advanced directives  Parkinson's has gradually worsened Trying to keep up exercise Some increased trouble with speech Feels somewhat more ackward Has follow up with Dr Shanda Howells tomorrow Ongoing short term memory loss--also will forget some intermediate memories Wife not overly concerned--has not given up and cognitive or practical tasks Does [email or web address] for ongoing memory tasks  Wants me to check the cyst on his hip again Seems stable Some itching at night No discharge, etc  Bowels are better since intestinal procedure Uses laxative only occasionally Still some gas-- does pass gas at night at times Will give info on low gas diet  Current Outpatient Prescriptions on File Prior to Visit  Medication Sig Dispense Refill  . amLODipine (NORVASC) 5 MG tablet TAKE 1 TABLET DAILY  90 tablet  2  . aspirin 81 MG EC tablet Take 81 mg by mouth daily.        . carbidopa-levodopa (SINEMET) 25-100 MG per tablet Take 1 tablet by mouth 3 (three) times daily.       . Carbidopa-Levodopa ER (SINEMET CR) 25-100 MG tablet controlled release Take 1 tablet by mouth at bedtime.      . fluticasone (FLONASE) 50 MCG/ACT nasal spray PLACE 2 SPRAYS INTO EACH NOSTRIL DAILY.  16 g  11  . Glucosamine-Chondroit-Vit C-Mn (GLUCOSAMINE CHONDR 1500 COMPLX) CAPS Take by mouth daily.        Marland Kitchen losartan (COZAAR) 50 MG tablet TAKE 1 TABLET (50 MG TOTAL) BY MOUTH DAILY.  90 tablet  3  . Multiple Vitamin (MULTIVITAMIN) tablet Take 1 tablet by mouth daily.        Marland Kitchen rOPINIRole (REQUIP) 0.25 MG tablet Take 0.75 mg by mouth 3 (three) times daily.       . sennosides-docusate sodium (SENOKOT-S) 8.6-50 MG tablet Take 2 tablets by mouth daily.      Marland Kitchen triamcinolone cream (KENALOG) 0.1 % APPLY  TWICE A DAY AS NEEDED FOR ITCHY AREAS  30 g  1   No current facility-administered medications on file prior to visit.    No Known Allergies  Past Medical History  Diagnosis Date  . Hyperlipidemia   . ED (erectile dysfunction)   . Degenerative joint disease     Past Surgical History  Procedure Laterality Date  . Flexible sigmoidoscopy  08/05  . Sigmoid volvulus resection  10/13    Dr Michela Pitcher  . Cataract extraction w/ intraocular lens implant Bilateral     Family History  Problem Relation Age of Onset  . Diabetes Paternal Uncle   . Coronary artery disease Neg Hx   . Hypertension Neg Hx     History   Social History  . Marital Status: Married    Spouse Name: N/A    Number of Children: 2  . Years of Education: N/A   Occupational History  . 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 Smoking status: Never Smoker   . Smokeless tobacco: Never Used  . Alcohol Use: Yes     Comment: wine  . Drug Use: No  . Sexual Activity: Not on file   Other Topics Concern  . 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 Negative for unexpected weight change.       Wears seat belt  HENT: Positive for hearing loss. Negative for tinnitus.        Mild hearing loss Regular with dentist  Eyes: Negative for visual disturbance.       Recent surgery--- getting new reading glasses  Respiratory: Positive for cough. Negative for chest tightness and shortness of breath.        Has throat clearing cough  Cardiovascular: Negative for chest pain, palpitations and leg swelling.       Fatigued easy with exercise    Gastrointestinal: Negative for nausea, vomiting, abdominal pain and blood in stool.       No heartburn  Endocrine: Negative for polydipsia and polyuria.  Genitourinary: Positive for frequency. Negative for urgency and difficulty urinating.       No sex--no problem  Musculoskeletal: Positive for back pain. Negative for arthralgias and joint swelling.       Sees chiropractor for chronic back pain  Skin: Negative for rash.       Dry skin--- cream helps at times in winter No suspicious lesions  Allergic/Immunologic: Negative for environmental allergies and immunocompromised state.  Neurological: Positive for dizziness, tremors and speech difficulty. Negative for syncope, light-headedness and headaches.  Hematological: Negative for adenopathy. Does not bruise/bleed easily.  Psychiatric/Behavioral: Negative for sleep disturbance and dysphoric mood. The patient is not nervous/anxious.        Nocturia x 2--but gets back to sleep       Objective:   Physical Exam  Constitutional: He is oriented to person, place, and time. He appears well-developed and well-nourished. No distress.  HENT:  Head: Normocephalic and atraumatic.  Right Ear: External ear normal.  Left Ear: External ear normal.  Mouth/Throat: Oropharynx is clear and moist. No oropharyngeal exudate.  Eyes: Conjunctivae and EOM are normal. Pupils are equal, round, and reactive to light.  Neck: Normal range of motion. Neck supple. No thyromegaly present.  Cardiovascular: Normal rate, regular rhythm, normal heart sounds and intact distal pulses.  Exam reveals no gallop.   No murmur heard. Pulmonary/Chest: Effort normal and breath sounds normal. No respiratory distress. He has no wheezes. He has no rales.  Abdominal: Soft. He exhibits no distension. There is no tenderness. There is no rebound and no guarding.  Musculoskeletal: He exhibits no edema and no tenderness.  Lymphadenopathy:    He has no cervical adenopathy.  Neurological: He  is alert and oriented to person, place, and time.  Minimal tremor Tone normal Moderate bradykinesia  Skin:  ~3cm indurated cyst on right buttock Discussed surgical referral if it bothers him (no inflammation or tenderness)  Psychiatric: He has a normal mood and affect. His behavior is normal.          Assessment & Plan:

## 2014-08-02 NOTE — Assessment & Plan Note (Signed)
Discussed primary prevention No statins 

## 2014-08-02 NOTE — Assessment & Plan Note (Signed)
UTD on colon No PSA due to age Will give prevnar in about 1 month--just had flu shot yesterday

## 2014-08-02 NOTE — Progress Notes (Signed)
Pre visit review using our clinic review tool, if applicable. No additional management support is needed unless otherwise documented below in the visit note. 

## 2014-08-02 NOTE — Assessment & Plan Note (Signed)
BP Readings from Last 3 Encounters:  08/02/14 103/55  01/30/14 138/80  11/22/13 148/82   If any dizziness, would cut back on meds

## 2014-08-02 NOTE — Assessment & Plan Note (Signed)
Adjusting Has appt with Dr Shanda Howells tomorrow Memory issues but not dementia ?early autonomic neuropathy with lower BP?

## 2014-08-29 ENCOUNTER — Ambulatory Visit (INDEPENDENT_AMBULATORY_CARE_PROVIDER_SITE_OTHER): Payer: Medicare Other | Admitting: Family Medicine

## 2014-08-29 ENCOUNTER — Encounter: Payer: Self-pay | Admitting: Family Medicine

## 2014-08-29 VITALS — BP 100/62 | HR 80 | Temp 97.8°F | Wt 162.5 lb

## 2014-08-29 DIAGNOSIS — R0781 Pleurodynia: Secondary | ICD-10-CM | POA: Insufficient documentation

## 2014-08-29 MED ORDER — TRAMADOL HCL 50 MG PO TABS: 25.0000 mg | ORAL_TABLET | Freq: Three times a day (TID) | ORAL | Status: DC | PRN

## 2014-08-29 NOTE — Assessment & Plan Note (Signed)
Point tender L posterior ribcage at around 9th rib after fall suffered 4d ago. Anticipate likely rib strain/contusion, less likely rib fracture. Treat supportively with heating pad, hug pillow to buffer cough/sneeze, and scheduled tylenol 1000mg  bid for next 1 week.  Provided with tramadol prescription for breakthrough pain after tylenol prn, discussed sedation precautions, rec take 1/2 tab to start. Discussed dx and treatment plan with patient and wife who agree. Discussed return for re evaluation if worsening pain instead of progressively improving. I am not concerned for flail chest today. Good air movement on exam.

## 2014-08-29 NOTE — Patient Instructions (Addendum)
I think you have rib strain or possible small fracture.  This may take about 6 weeks to heal, but we should see improvement as time goes on - if worsening pain or any trouble breathing, please return to be re evaluated. Take tylenol 1000mg  twice daily for next week. May use tramadol for breakthrough pain. Use pillow whenever coughing, try to keep taking deep breaths, may use heating pad for symptomatic relieve.

## 2014-08-29 NOTE — Progress Notes (Signed)
Pre visit review using our clinic review tool, if applicable. No additional management support is needed unless otherwise documented below in the visit note. 

## 2014-08-29 NOTE — Progress Notes (Signed)
BP 100/62  Pulse 80  Temp(Src) 97.8 F (36.6 C) (Oral)  Wt 162 lb 8 oz (73.71 kg)   CC: back pain after fall  Subjective:    Patient ID: Johnny Barnett, male    DOB: 02/28/1941, 73 y.o.   MRN: [id number]  HPI: Johnny Barnett is a 73 y.o. male presenting on 08/29/2014 for Fall   [place] [employer] [place] missed step. Was holding on to stair rail. Landed on stairs, twisted and hit left side. Persistent pain since fall along left side of body.  Did not hit head or back. No other pains anywhere. Worse pain with rolling over in bed and worse pain with transitions from laying down to sitting up to standing.   Hasn't tried any meds for this yet, no heat/ice.  Has tried some tylenol intermittently.  H/o PD on sinemet and requip. Neurologist [facility]  Relevant past medical, surgical, family and social history reviewed and updated as indicated.  Allergies and medications reviewed and updated. Current Outpatient Prescriptions on File Prior to Visit  Medication Sig  . amLODipine (NORVASC) 5 MG tablet TAKE 1 TABLET DAILY  . aspirin 81 MG EC tablet Take 81 mg by mouth daily.    . carbidopa-levodopa (SINEMET) 25-100 MG per tablet Take 1 tablet by mouth 3 (three) times daily. 1 tablet in the morning; 1.5 tablets with lunch; 1 tablet with dinner  . Carbidopa-Levodopa ER (SINEMET CR) 25-100 MG tablet controlled release Take 2 tablets by mouth at bedtime.   . fluticasone (FLONASE) 50 MCG/ACT nasal spray PLACE 2 SPRAYS INTO EACH NOSTRIL DAILY.  Marland Kitchen. gabapentin (NEURONTIN) 100 MG capsule Take 100 mg by mouth 3 (three) times daily.   . Glucosamine-Chondroit-Vit C-Mn (GLUCOSAMINE CHONDR 1500 COMPLX) CAPS Take by mouth daily.    Marland Kitchen. losartan (COZAAR) 50 MG tablet TAKE 1 TABLET (50 MG TOTAL) BY MOUTH DAILY.  . Multiple Vitamin (MULTIVITAMIN) tablet Take 1 tablet by mouth daily.    Marland Kitchen. rOPINIRole (REQUIP) 0.25 MG tablet Take 0.75 mg by mouth 3 (three) times daily.   .  sennosides-docusate sodium (SENOKOT-S) 8.6-50 MG tablet Take 2 tablets by mouth daily.  Marland Kitchen. triamcinolone cream (KENALOG) 0.1 % APPLY TWICE A DAY AS NEEDED FOR ITCHY AREAS   No current facility-administered medications on file prior to visit.    Review of Systems Per HPI unless specifically indicated above    Objective:    BP 100/62  Pulse 80  Temp(Src) 97.8 F (36.6 C) (Oral)  Wt 162 lb 8 oz (73.71 kg)  Physical Exam  Nursing note and vitals reviewed. Constitutional: He appears well-developed and well-nourished. No distress.  HENT:  Mouth/Throat: Oropharynx is clear and moist. No oropharyngeal exudate.  Cardiovascular: Normal rate, regular rhythm, normal heart sounds and intact distal pulses.   No murmur heard. Pulmonary/Chest: Effort normal and breath sounds normal. No respiratory distress. He has no decreased breath sounds. He has no wheezes. He has no rhonchi. He has no rales. He exhibits bony tenderness.  Point tender to palpation L ~9th posterior rib and surrounding ribcage, but no point tenderness superior or inferior ribs Good air movement  Musculoskeletal:  No midline spine tenderness  Mild L lumbar paraspinous mm tightness without pain No pain at SIJ or GTB on left.  Psychiatric: He has a normal mood and affect.       Assessment & Plan:   Problem List Items Addressed This Visit   Rib pain on  left side - Primary     Point tender L posterior ribcage at around 9th rib after fall suffered 4d ago. Anticipate likely rib strain/contusion, less likely rib fracture. Treat supportively with heating pad, hug pillow to buffer cough/sneeze, and scheduled tylenol 1000mg  bid for next 1 week.  Provided with tramadol prescription for breakthrough pain after tylenol prn, discussed sedation precautions, rec take 1/2 tab to start. Discussed dx and treatment plan with patient and wife who agree. Discussed return for re evaluation if worsening pain instead of progressively improving. I am  not concerned for flail chest today. Good air movement on exam.        Follow up plan: No Follow-up on file.

## 2014-09-08 ENCOUNTER — Ambulatory Visit: Payer: Medicare Other

## 2014-09-22 ENCOUNTER — Ambulatory Visit (INDEPENDENT_AMBULATORY_CARE_PROVIDER_SITE_OTHER): Payer: Medicare Other

## 2014-09-22 DIAGNOSIS — Z23 Encounter for immunization: Secondary | ICD-10-CM

## 2014-10-04 ENCOUNTER — Telehealth: Payer: Self-pay | Admitting: Internal Medicine

## 2014-10-04 NOTE — Telephone Encounter (Signed)
The boil that the patient discussed with you in September has gotten worse.  He said you told him if it got worse that you would refer him to a Careers advisersurgeon.  He said it is bleeding now.  I told the patient that if he feels like he is in pain or having extra problems with his boil, before we can get his referral, then he needs to call us so we can get him in here to be seen.  Pt prefers [facility] [place] the best

## 2014-10-04 NOTE — Telephone Encounter (Signed)
Called Dr Marlowe KaysEly's office and scheduled the patient to see Dr Egbert GaribaldiBird on 10/10/14 at 3:30pm. [facility]

## 2014-11-07 ENCOUNTER — Ambulatory Visit: Payer: Self-pay | Admitting: Surgery

## 2014-11-07 LAB — BASIC METABOLIC PANEL
Anion Gap: 6 — ABNORMAL LOW (ref 7–16)
BUN: 22 mg/dL — ABNORMAL HIGH (ref 7–18)
CHLORIDE: 99 mmol/L (ref 98–107)
CO2: 30 mmol/L (ref 21–32)
CREATININE: 0.95 mg/dL (ref 0.60–1.30)
Calcium, Total: 8.5 mg/dL (ref 8.5–10.1)
EGFR (African American): >60
Glucose: 93 mg/dL (ref 65–99)
OSMOLALITY: 273 (ref 275–301)
Potassium: 4 mmol/L (ref 3.5–5.1)
Sodium: 135 mmol/L — ABNORMAL LOW (ref 136–145)

## 2014-11-10 HISTORY — PX: CYST REMOVAL TRUNK: SHX6283

## 2014-11-15 ENCOUNTER — Ambulatory Visit: Payer: Self-pay | Admitting: Surgery

## 2015-01-29 ENCOUNTER — Other Ambulatory Visit: Payer: Self-pay | Admitting: Internal Medicine

## 2015-02-03 ENCOUNTER — Other Ambulatory Visit: Payer: Self-pay | Admitting: Internal Medicine

## 2015-02-27 NOTE — Discharge Summary (Signed)
PATIENT NAME:  Johnny Barnett, Johnny Barnett DATE OF BIRTH:  [birth year]  DATE OF ADMISSION:  08/11/2012 DATE OF DISCHARGE:  08/18/2012  BRIEF HISTORY/HOSPITAL COURSE: Murriel HopperGeorge Seltzer is a 74 year old gentleman admitted to the hospital for elective sigmoidectomy following an episode of sigmoid volvulus. He had his volvulus reduced endoscopically and has done well in the interim. He went home, underwent clear liquid bowel prep, and presents for surgical intervention. After appropriate preoperative preparation and informed consent, he was taken to surgery on the morning of 08/11/2012 and underwent a sigmoid colectomy. The procedure was uncomplicated. He had no significant intraoperative problems. Postoperatively, he had mild nausea and pain control issues and slow return of bowel function. However, he did not require a nasogastric tube. Epidural was removed on the third hospital day. The Foley catheter was removed on the second hospital day. His Wound VAC was removed on the fourth hospital day. He is up, active, tolerating a diet this morning. His wound looks good. There is no sign of any infection. He is soon going to have a walker delivered to his house to assist with his ambulation post physical therapy evaluation since his Parkinson disease has been a significant issue while in the hospital.   DISCHARGE MEDICATIONS:  1. Amlodipine 5 mg p.o. daily.  2. Carbidopa/levodopa 25/100 mg t.i.d.  3. Glucosamine chondroitin 1 tablet daily.  4. Multivitamin 1 tablet daily.  5. Ropinirole 0.25 mg p.o. t.i.d.  6. Aspirin 325 mg p.o. daily.  7. Bisoprolol 5 mg once a day.  8. Potassium 1 tablet b.i.d.  9. Norco 5/325 p.o. every 6 hours p.r.n.   DISCHARGE DIAGNOSIS: Sigmoid volvulus.        SURGERY: Sigmoid colectomy.  ____________________________ Quentin Orealph L. Ely III, MD rle:cbb D: 08/18/2012 06:25:34 ET T: 08/18/2012 11:57:53 ET JOB#: [id number]  cc: Carmie Endalph L. Ely III, MD, <Dictator> Karie Schwalbeichard I.  [place], MD Quentin OreALPH L ELY MD ELECTRONICALLY SIGNED 08/20/2012 22:51

## 2015-02-27 NOTE — Consult Note (Signed)
General Aspect Johnny Barnett is a very pleasant 74 year old gentleman with no known cardiac history,  h/o HTN, patient of Dr. Silvio Pate,  with a history of lightheadedness,  admitted to the hospital with constipation,  volvulus  on CT scan. Cardiology was consulted for chest pain and preop evaluation prior to possible colectomy.   he presents with ABD sweliing and pain, starting several days ago. He has tried numerous stool softeners, miralex, MOM with no relief. Colonoscopy performed earlier today with colonic decompression.   He reports feels weak with malaise earlier in the week. On Monday, "did not feel well". Uncertain if he had chest pain. ABD sx persisted and got worse during the week.  He was last seen in the office July 2012. At that time, he had dizziness. He had a long trial of meclizine with no improvement. His had a subsequent MRI that has shown chronic small vessel disease concerning for ischemia with no acute ischemia noted. He had followup with neurology  scheduled after her visit.  He had shortness of breath with exertion in 2012,  weight was dropping. He had worsening finger dexterity, short-term memory loss, he felt stiff and had "shuffle gait". He described his dizziness as a lightheadedness and not a spinning. He has been diagnosed with parkinsons.  At home, he has been walking three times a week with his wife with no significant chest pain.    Present Illness . Family and Social History: ??  Family History Hypertension ??  Social History negative tobacco, positive rare ETOH, negative Illicit drugs, No h/o withdrawl ??  Place of Living Home,  Accompanied by daughter, daughter's fiance   Physical Exam:   GEN well developed, well nourished, no acute distress    HEENT red conjunctivae    NECK supple  No masses     RESP normal resp effort  clear BS     CARD Regular rate and rhythm  No murmur     LYMPH negative neck    EXTR negative cyanosis/clubbing, negative edema     SKIN normal to palpation    NEURO motor/sensory function intact    PSYCH alert, A+O to time, place, person, good insight   Review of Systems:   Subjective/Chief Complaint ABD distention and pain    General: No Complaints     Skin: No Complaints     ENT: No Complaints     Eyes: No Complaints     Neck: No Complaints     Respiratory: No Complaints     Cardiovascular: No Complaints     Gastrointestinal: improved ABD pain     Genitourinary: No Complaints     Vascular: No Complaints     Musculoskeletal: No Complaints     Neurologic: No Complaints     Hematologic: No Complaints     Endocrine: No Complaints     Psychiatric: No Complaints     Review of Systems: All other systems were reviewed and found to be negative     Medications/Allergies Reviewed Medications/Allergies reviewed      Parkinson's Disease:    HTN:    Denies:    Vasectomy:        Admit Diagnosis:   SIGMOID VOLVULUS: 06-Aug-2012, Active, SIGMOID VOLVULUS  Home Medications: Medication Instructions Status  amLODIPine 5 mg oral tablet 1 tab(s) orally once a day Active  bisoprolol-hydrochlorothiazide 2.5 mg-6.25 mg oral tablet 1 tab(s) orally once a day Active  carbidopa-levodopa 25 mg-100 mg oral tablet 1 tab(s) orally 3 times  a day Active  Glucosamine Chondroitin MSM Complex 1 tab(s) orally once a day 1500 complex capsule Active  multivitamin 1 tab(s) orally once a day Active  MiraLax oral powder for reconstitution 17 gram(s) orally once a day Active  ropinirole 0.25 mg oral tablet 3 tab(s) orally 3 times a day Active  triamcinolone Apply topically to affected area , As Needed- for Itching  Active  aspirin 325 mg oral tablet 1 tab(s) orally once Active   Lab Results:  Hepatic:  26-Sep-13 22:39    Bilirubin, Total 0.7   Alkaline Phosphatase 66   SGPT (ALT)  9   SGOT (AST) 20   Total Protein, Serum 8.2   Albumin, Serum 4.2  Routine Chem:  26-Sep-13 22:39    Glucose, Serum  118    BUN 18   Creatinine (comp) 0.95   Sodium, Serum  130   Potassium, Serum  3.2   Chloride, Serum  91   CO2, Serum 26   Calcium (Total), Serum 8.9   Osmolality (calc) 264   eGFR (African American) >60   eGFR (Non-African American) >60 (eGFR values <52mL/min/1.73 m2 may be an indication of chronic kidney disease (CKD). Calculated eGFR is useful in patients with stable renal function. The eGFR calculation will not be reliable in acutely ill patients when serum creatinine is changing rapidly. It is not useful in  patients on dialysis. The eGFR calculation may not be applicable to patients at the low and high extremes of body sizes, pregnant women, and vegetarians.)   Anion Gap 13  Routine UA:  26-Sep-13 22:39    Color (UA) Yellow   Clarity (UA) Hazy   Glucose (UA) Negative   Bilirubin (UA) Negative   Ketones (UA) 1+   Specific Gravity (UA) 1.017   Blood (UA) 1+   pH (UA) 6.0   Protein (UA) Negative   Nitrite (UA) Negative   Leukocyte Esterase (UA) Negative (Result(s) reported on 05 Aug 2012 at 11:19PM.)   RBC (UA) 13 /HPF   Bacteria (UA) NONE SEEN   Epithelial Cells (UA) NONE SEEN   Mucous (UA) PRESENT (Result(s) reported on 05 Aug 2012 at 11:19PM.)  Routine Hem:  26-Sep-13 22:39    WBC (CBC) 8.7   RBC (CBC) 4.51   Hemoglobin (CBC) 15.5   Hematocrit (CBC) 44.1   Platelet Count (CBC) 251 (Result(s) reported on 05 Aug 2012 at 11:17PM.)   MCV 98   MCH  34.3   MCHC 35.1   RDW 12.9    No Known Allergies:   Vital Signs/Nurse's Notes: **Vital Signs.:   27-Sep-13 11:16   Vital Signs Type Post-Op   Temperature Temperature (F) 98.2   Celsius 36.7   Temperature Source oral   Pulse Pulse 91   Respirations Respirations 16   Systolic BP Systolic BP 675   Diastolic BP (mmHg) Diastolic BP (mmHg) 77   Mean BP 104   Pulse Ox % Pulse Ox % 95   Pulse Ox Activity Level  At rest   Oxygen Delivery Room Air/ 21 %     Impression Johnny Barnett is a very pleasant 74 year old  gentleman with no known cardiac history,  h/o HTN, patient of Dr. Silvio Pate,  with a history of lightheadedness,  admitted to the hospital with constipation,  volvulus  on CT scan. Cardiology was consulted for chest pain and preop evaluation prior to possible colectomy.   1) Chest pain: No significant sx concerning for angina he has been exerting himself with no complaints  No prior cardiac hx. EKG essentiallly normal He would be acceptable risk for upcoming colectomy if needed He has completed ABD decompression and feels better No further workup at this time.  2)HTN: Would continue current outpt medications May need to confirm HCTZ as he reports stopping HCTZ to avoid polyuria Would minimize IVFs   3)Parkinsons dx Managed by [facility]   Electronic Signatures: Ida Rogue (MD)  (Signed 27-Sep-13 13:28)  Authored: General Aspect/Present Illness, History and Physical Exam, Review of System, Past Medical History, Health Issues, Home Medications, Labs, Allergies, Vital Signs/Nurse's Notes, Impression/Plan   Last Updated: 27-Sep-13 13:28 by Ida Rogue (MD)

## 2015-02-27 NOTE — Consult Note (Signed)
PATIENT NAME:  Johnny Barnett, Johnny Barnett MR#:  [id number] DATE OF BIRTH:  1941/04/30  DATE OF CONSULTATION:  08/06/2012  REFERRING PHYSICIAN:  Ida Rogue, MD CONSULTING PHYSICIAN:  Enid Baas, MD  ADMITTING PHYSICIAN: Dr. Ida Rogue.   PRIMARY CARE PHYSICIAN: Dr. Tillman Abide.   REASON FOR CONSULTATION: Medical management.   BRIEF HISTORY: Mr. Petzold is a 74 year old Caucasian male with past medical history significant for hypertension, Parkinson's disease and occasional constipation admitted to the hospital for abdominal pain and distention and CT abdomen showing sigmoid volvulus. The patient had a colonoscopy and decompression done this morning which relieved his symptoms and he is currently n.p.o. In case of recurrence, might need sigmoid colectomy. The patient denies any chest pain. He did have only abdominal pain actually going on for the last two days, but he was still active, going to work, doing his stuff. He was also nauseous and his abdomen was very distended and with severe tenderness, he came to the Emergency Room last night. CT on admission showing sigmoid volvulus. He has been struggling with constipation for a while and he is on Senna, Colace, Milk of Mag and MiraLAX at home. Over the last couple of days he has been taking them regularly because of the constipation. He has not had a bowel movement for more than three days now and presents with a volvulus as mentioned above. No chest pain, very active at baseline, walks with his wife. Does have some muscle cramping pain, but no dyspnea or chest pain limiting his activities.   PAST MEDICAL HISTORY:  1. Hypertension.  2. Parkinson's disease.  3. Constipation.   PAST SURGICAL HISTORY: Vasectomy.   ALLERGIES: No known drug allergies.   MEDICATIONS AT HOME:  1. Norvasc 5 mg p.o. daily.  2. Aspirin 81 mg p.o. daily.  3. Sinemet 25/100 mg 1 tablet by mouth 3 times a day.  4. Glucosamine chondroitin sulfate 1  capsule by mouth daily.  5. Multivitamin 1 tablet by mouth daily.  6. MiraLAX 17 grams p.o. b.i.d.  7. Requip 0.75 mg p.o. t.i.d.  8. Senna Colace 2 tablets by mouth daily.  9. Triamcinolone ointment twice a day for itching.  10. The patient was on bisoprolol/HCTZ 2.5 mg/6.25 mg which was recently stopped due to hyponatremia as an outpatient.   SOCIAL HISTORY: Lives at home with his wife. Denies any smoking. Drinks a glass of wine every night.   FAMILY HISTORY: Mom lived up to 23 and has hypertension. No coronary artery disease or cancer running in the family.   REVIEW OF SYSTEMS: CONSTITUTIONAL: No fatigue, fever, or chills. No recent weight gain or weight loss. EYES: No inflammation or glaucoma. Positive for left eye cataract recently. Uses reading glasses. ENT: No tinnitus, ear pain, epistaxis or discharge. No dysphagia. RESPIRATORY: No cough, wheezing, hemoptysis, or chronic obstructive pulmonary disease. CARDIOVASCULAR: No chest pain, orthopnea, edema, arrhythmia, palpitations, or syncope. GASTROINTESTINAL: Positive for abdominal distention and nausea this morning. Positive for constipation. No diarrhea, hematemesis, or melena. No vomiting. GENITOURINARY: No dysuria, hematuria, renal calculus, or frequency. ENDOCRINE: No polyuria, nocturia, thyroid problems, heat or cold intolerance. HEMATOLOGY: No anemia, easy bruising or bleeding. SKIN: No acne, rash, or lesions. MUSCULOSKELETAL: No neck, back, shoulder pain, arthritis, or gout. NEUROLOGIC: No numbness, weakness, cerebrovascular accident, transient ischemic attack, or seizures. Positive for Parkinson's disease. PSYCHOLOGIC: No anxiety, insomnia, or depression.   PHYSICAL EXAMINATION:  VITAL SIGNS: Temperature 98.3 degrees Fahrenheit, pulse 88, respirations 16, blood pressure 171/83, pulse oximetry 95% on  room air.   HEENT: Normocephalic, atraumatic. Pupils equal, round, reactive to light. Anicteric sclera. Extraocular movements intact.  Oropharynx clear without erythema, mass, or exudates.   NECK: Supple. No thyromegaly, JVD, or carotid bruits. No lymphadenopathy.   LUNGS: Clear to auscultation bilaterally. No wheeze or crackles. No use of accessory muscles for breathing. Very few rhonchi at right lung base.   CARDIOVASCULAR: S1, S2, regular rate and rhythm. 3/6 systolic murmur. No rubs or gallops.   ABDOMEN: Soft, nontender, nondistended. No hepatosplenomegaly. Normal bowel sounds. A rectal tube is present.   EXTREMITIES: No pedal edema. No clubbing or cyanosis. 2+ dorsalis pedis pulses palpable bilaterally.   SKIN: No acne, rash, or lesions.   LYMPHATICS: No cervical or inguinal lymphadenopathy.   NEUROLOGIC: Cranial nerves intact. No focal motor or sensory deficits. The patient is awake, alert, and oriented x3.   LABORATORY, RADIOLOGICAL AND DIAGNOSTIC DATA: WBC 8.7, hemoglobin 15.3, hematocrit 44.1, platelet count 251. Sodium 130, potassium 3.2, chloride 91, bicarbonate 26, BUN 18, creatinine 0.93, glucose 118, calcium 8.9. ALT 9, AST 20, alkaline phosphatase 66, total bilirubin 0.7, albumin 4.2. Urinalysis negative for any infection. Chest x-ray showing clear lung fields. No acute disease of the chest. Abdominal x-ray showing ileus versus distal large bowel obstruction. CT of the abdomen and pelvis showing findings consistent with sigmoid volvulus. Marked distention of sigmoid colon is demonstrated within the transition zone in the mid sigmoid to normal caliber rectosigmoid. No small bowel obstruction. No acute hepatobiliary abnormality, atelectasis versus fibrosis at the lung bases. Will repeat KUB after colonoscopy showing successful decompression of the sigmoid volvulus, only a small segment of dilated colon demonstrating now to right of the midline. EKG is within normal limits.   IMPRESSION/RECOMMENDATIONS: 74 year old male with history of hypertension, Parkinson's disease and constipation issues admitted for sigmoid  volvulus.  1. Sigmoid volvulus. The patient is status post colonoscopy and decompression this morning. Abdomen is soft and now doing well. He remains n.p.o. in management per surgery. If he requires surgery, that is sigmoid colectomy, he is low risk candidate for surgery. Please also see cardiology clearance by Dr. Mariah MillingGollan.  2. Hypertension. The patient's bisoprolol/HCTZ was recently stopped due to hyponatremia. Continue Norvasc now. If needed, will add oral bisoprolol. We will also add IV hydralazine p.r.n.  3. Parkinson's disease. He follows up with [facility]. Continue Sinemet and Requip.  4. Hyponatremia and hypokalemia. Diuretics stopped and replacements given and follow-up labs in the morning.   CODE STATUS: FULL CODE.   TIME SPENT ON CONSULTATION: 50 minutes.   ____________________________ Enid Baasadhika Tabetha Haraway, MD rk:ap D: 08/06/2012 15:47:20 ET T: 08/06/2012 16:16:43 ET JOB#: [id number]  cc: Enid Baasadhika Azora Bonzo, MD, <Dictator> Karie Schwalbeichard I. Letvak, MD Carmie Endalph L. Ely III, MD Enid BaasADHIKA Nivan Melendrez MD ELECTRONICALLY SIGNED 08/12/2012 14:56

## 2015-02-27 NOTE — H&P (Signed)
Subjective/Chief Complaint Constipation, Obstipation x 3 days    History of Present Illness Mr. Johnny Barnett is an unfortunate 74 yo M with a history of constipation and newly diagnosed Parkinson's disease who presents with 3 days of inability to have a BM x 3 days.  He says that over the past 3 days he has been able to have a BM and pass gas.  He has taken OTC maalox and stool softeners without resolution.  He has been constipated over the past year since he has been diagnosed with Parkinson's disease and has been on medications for that.  Can go days without having a BM.  Has diffuse pain. Feels bloated Was seen by Dr. Janey GreaserLedbeck recently for chest tightness which has been worked up as an outpatient and has had his BP meds adjusted recently.  No fevers, chills, current chest pain, shortness of breath, nausea/vomiting, dysuria/hematuria.  Is very functional at baseline (drives, cares for self).    Past History Parkinson's Disease HTN H/o vasectomy DJD    Past Medical Health Hypertension   Past Med/Surgical Hx:  Parkinson's Disease:   HTN:   Denies:   Vasectomy:   ALLERGIES:  No Known Allergies:     Medications ASA 81 mg PO qdaily   Family and Social History:   Family History Negative  Hypertension    Social History negative tobacco, positive ETOH, negative Illicit drugs, No h/o withdrawl    Place of Living Home  Accompanied by daughter, daughter's fiance   Review of Systems:   Subjective/Chief Complaint Constipation/obstipation, abd pain    Fever/Chills No    Cough No    Sputum No    Abdominal Pain Yes    Diarrhea No    Constipation Yes    Nausea/Vomiting No    SOB/DOE No    Chest Pain No    Dysuria No    Tolerating Diet No    Medications/Allergies Reviewed Medications/Allergies reviewed   Physical Exam:   GEN no acute distress    HEENT pink conjunctivae, PERRL    NECK supple    RESP normal resp effort  no use of accessory muscles    CARD regular rate   no thrills  no carotid bruits    ABD positive tenderness  no liver/spleen enlargement  no hernia  soft  distended    EXTR negative cyanosis/clubbing, negative edema    SKIN No rashes, No ulcers    NEURO cranial nerves intact, negative rigidity, negative tremor    PSYCH alert, A+O to time, place, person, good insight     Assessment/Admission Diagnosis Mr. Johnny Barnett is a pleasant 74 yo M with Parkinson's who presents with constipation/obstipation, abdominal distention and pain.  I have independeltly evaluated his CT and observe that he has sigmoid volvulus.  HR is elevated but hemodynamically stable.  Labs relatively unremarkable.    Plan I have contacted Dr. Bluford Kaufmannh who will evaluate him and likely attempt endoscopic detorsion.  If successful, will require sigmoid colectomy prior to discharge.  Have warned him of possible need for emergent colectomy with ostomy if we find that his volvulized colon is ischemic, perforated or unable to be detorsed.  Will admit for pain control, rehydration.  Will discuss cardiac clearance prior to semi-elective procedure.   Electronic Signatures: Jarvis NewcomerLundquist, Siana Panameno A (MD)  (Signed 27-Sep-13 06:20)  Authored: CHIEF COMPLAINT and HISTORY, PAST MEDICAL/SURGIAL HISTORY, ALLERGIES, OTHER MEDICATIONS, FAMILY AND SOCIAL HISTORY, REVIEW OF SYSTEMS, PHYSICAL EXAM, ASSESSMENT AND PLAN   Last Updated: 27-Sep-13  06:20 by Jarvis Newcomer (MD)

## 2015-02-27 NOTE — Op Note (Signed)
PATIENT NAME:  Johnny Barnett, Johnny Barnett MR#:  [id number] DATE OF BIRTH:  11/25/1940  DATE OF PROCEDURE:  08/11/2012  PREOPERATIVE DIAGNOSIS: Sigmoid volvulus.   POSTOPERATIVE DIAGNOSIS: Sigmoid volvulus.   PROCEDURE PERFORMED: Sigmoid colectomy, laparoscopically assisted.   SURGEON: Quentin Ore III, MD  ASSISTANT: Dr. Ida Rogue  ANESTHESIA: General with epidural.   DESCRIPTION OF PROCEDURE: With the patient in the supine position after induction of appropriate general anesthesia, the patient's abdomen was prepped with Betadine, draped with sterile towels. An alcohol wipe Betadine impregnated Steri-Drape was utilized. The patient was placed in lithotomy position and Foley catheter placed. The patient was appropriately padded. Infraumbilical incision was made in the standard fashion, carried down bluntly through the subcutaneous tissue. The Veress needle was used to cannulate the peritoneal cavity. CO2 was insufflated to appropriate pressure measurements. When approximately 2.5 liters of CO2 were instilled, the Veress needle was withdrawn. An 11 mm [employer] port inserted in the peritoneal cavity. Intraperitoneal position was confirmed and CO2 was reinsufflated. The abdomen examined with an angled scope. There was a large loop of dilated sigmoid which was quite redundant coming out of the pelvis. The area of volvulus was easily identified. There did not appear to be any evidence of a twist at the present time. A lateral incision was made in the right lower quadrant transversely and an 11 mm port inserted under direct vision. The bowel was manipulated and appeared to be easily movable to the midline. Midline incision was extended a hands breath and down through the subcutaneous tissue exposing the midline fascia which was opened the length of the skin incision. Peritoneum was opened. The bowel was elevated to the incision without difficulty. No significant mobilization of the distal or  proximal colon was necessary. The distal bowel was divided with a single application of the Endo GIA stapler carrying a 75 mm load and the proximal bowel was divided in a similar fashion. The mesentery was taken down using the LigaSure apparatus. The large dilated venous structures were individually ligated with 3-0 Vicryl. The bowel passed off the table. The distal bowel had a pursestring suture placed by placing the pursestring clamp across the cleaned end of the bowel and a Look pursesting  nylon suture inserted through the stapling device. The bowel was opened, incised. It appeared to accommodate a EEA 29 stapler. The anvil of the stapler was inserted in the distal bowel and secured by the pursestring. The proximal bowel was brought into the incision. A longitudinal colotomy was made and the body of the stapler inserted through the colotomy. The spike was driven through the staple line, married to the anvil and the stapler approximated and fired. The rings were intact. Manual palpation revealed no evidence of any defect. Colotomy was then closed transversely using a single application of the TX-60 stapling device carrying a blue load. The area was leak tested and no significant leak was identified using air through the rectum. Seromuscular suture of 3-0 silk were placed in a circular fashion around the anastomosis. The defect in the mesentery was closed with a running suture of 3-0 Vicryl. The area was then copiously suctioned and irrigated. No significant abnormalities were identified. The patient did have large bilateral direct inguinal hernias. Midline fascia was closed with a running suture of doubled 0 PDS from each end tying in the middle. The skin was closed loosely with 3-0 nylon and a wound VAC applied. The patient was returned to the recovery room having tolerated the procedure  well. Sponge, instrument, and needle counts were correct x2 in the Operating Room.   ____________________________ Quentin Orealph L.  Ely III, MD rle:cms D: 08/11/2012 10:11:50 ET T: 08/11/2012 10:29:43 ET JOB#: [id number]  cc: Carmie Endalph L. Ely III, MD, <Dictator> Karie Schwalbeichard I. Letvak, MD Quentin OreALPH L ELY MD ELECTRONICALLY SIGNED 08/11/2012 15:11

## 2015-02-27 NOTE — Consult Note (Signed)
Colon successfully decompressed. Site of "twist" near 20cm. Placed decompression tube above site of obstruction. Repeat KUB ordered. Flush tube every few hrs. Abd much softer. Dr. Marva PandaSkulskie will see patient over the weekend.  Electronic Signatures: Lutricia Feilh, Haillee Johann (MD)  (Signed on 27-Sep-13 08:42)  Authored  Last Updated: 27-Sep-13 08:42 by Lutricia Feilh, Arbie Blankley (MD)

## 2015-02-27 NOTE — Consult Note (Signed)
PATIENT NAME:  Johnny Barnett, Johnny Barnett MR#:  [id number] DATE OF BIRTH:  11/16/1940  DATE OF CONSULTATION:  08/06/2012  REFERRING PHYSICIAN:   CONSULTING PHYSICIAN:  Ezzard StandingPaul Y. Bluford Kaufmannh, MD  REASON FOR REFERRAL: Sigmoid volvulus.   DESCRIPTION: Patient is a 74 year old white male who was recently diagnosed with Parkinson disease who comes to the Emergency Room with a significantly distended abdomen with obstipation for the last three days. He has not been able to pass flatus either. He has taken some Maalox and stool softeners and even some enemas at home without any relief. Because the abdomen was getting bigger, he is having significant amount of pain and bloating. As a result he came to the Emergency Room. In the Emergency Room abdominal x-ray and CT scan of the abdomen showed severely dilated colon with probable sigmoid volvulus. As a result, I was consulted urgently for colonic decompression.   PAST MEDICAL HISTORY:  1. Parkinson's. 2. Hypertension. 3. Arthritis.   PAST SURGICAL HISTORY: Vasectomy.   ALLERGIES: He has no known allergies.   MEDICATIONS AT HOME: Baby aspirin and some over-the-counter medicines.   FAMILY HISTORY: Positive for hypertension.   SOCIAL HISTORY: Denies tobacco use. He does drink alcohol.   REVIEW OF SYSTEMS: There is no fevers or chills. There is no coughing or shortness of breath. There are no visual or hearing changes. There is no chest pain or palpitations. He is complaining of abdominal pain. There is no nausea at this time. There is no hematochezia or melena but he does complain of chronic constipation.    PHYSICAL EXAMINATION:  GENERAL: Patient is in no acute distress at this time.   VITAL SIGNS: He is afebrile.  HEAD AND NECK: Examination is within normal limits.    CARDIAC: Regular rhythm and rate.   LUNGS: Clear bilaterally.   ABDOMEN: Quite distended with hypoactive bowel sounds. Soft but diffusely tender. There is no obvious rebound or guarding.    LABORATORY, DIAGNOSTIC, AND RADIOLOGICAL DATA: Sodium 130, potassium 3.2, chloride 91, BUN 18, creatinine 0.95. Liver enzymes are normal. White count 8.7, hemoglobin 15.5. Urinalysis negative. CT scan consistent with sigmoid volvulus with marked distention of sigmoid colon and proximal area.   IMPRESSION AND RECOMMENDATIONS: This is a patient with likely sigmoid volvulus resulting in obstipation and extremely distended abdomen. I talked to the family about doing a colonic decompression this morning. I also discussed potential risks and complications including perforation and inability to decompress his colon. If that is the case he will need surgery. Patient will be given some enemas prior to the procedure. Endoscopy was called.   Thank you for the referral.   ____________________________ Ezzard StandingPaul Y. Bluford Kaufmannh, MD pyo:cms D: 08/06/2012 09:18:20 ET T: 08/06/2012 10:45:47 ET JOB#: [id number]  cc: Ezzard StandingPaul Y. Bluford Kaufmannh, MD, <Dictator> Ezzard StandingPAUL Y Zackry Deines MD ELECTRONICALLY SIGNED 08/08/2012 14:7820:04

## 2015-02-27 NOTE — Discharge Summary (Signed)
PATIENT NAME:  Johnny Barnett, Zollie W MR#:  [id number] DATE OF BIRTH:  [birth year]  DATE OF ADMISSION:  08/06/2012 DATE OF DISCHARGE:  08/07/2012  Patient name Johnny DingwallGeorge Barnett her [place] seven.   DATE OF ADMISSION: 08/06/2012.   DATE OF DISCHARGE: 05/07/2012.   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 He was taken urgently to the GI suite where he underwent an endoscopic decompression without difficulty. The bowel was reduce it. Jakel Alphin reduced in diameter easily and his abdomen became asymptomatic. A r Rectal tube wasere placed. He was started on a liquid diet. He has anis underlying problem with Parkinson's disease. This morning he had a normal bowel movement and passed his rectal tube.  His rectal tube.   ABDOMEN: His abdomen is soft without any evidence of recurrence and he is tolerating a liquid diet. We discussed the indication for surgical intervention. I would like to have his abdomen White to have his abdomen decompressed further before considering surgery. He is in agreement with this plan. We will set him up for elective surgical repair. We anticipate a sigmoid colectomy with plan of doing a laparoscopically-assisted procedure. We will discharge him this evening and we will set up his  the surgical plans as an outpatient.   DISCHARGE MEDICATIONS:  1. Amlodipine 5 mg p.o. daily.  2. Bisoprolol/hDIS hydrochlorothiazide 5 mg/ 65 mg p.o. daily. We will hold his hydrochlorothiazide until after surgery.  3. Carbidopa/levodopa CR b.i.d. OPA p.o. DOT 825 month milligrams 25 mg /100 mg t.i.d.  4. Gglucosamine chondroitin.  5. Multivitamins.  6. MiraLAX. We ax we will hold the MiraLAX until ax frontal surgery.  7. Ropinirole Allen 0.25 mg, 3 tablets 3 times a day.  8. Aspirin 325 mg p.o. daily.  9. , Potassium 20 mEq  almost p.o. b.i.d.   FINAL DISCHARGE DIAGNOSIS:  Sigmoid volvulus.        DATE OF PROCEDURE: CERCP unless or cololonnoscopy. placed in a copy two in Dr. Alphonsus SiasLetvak Dr. Mariah MillingGollan Dr. Marva PandaSkulskie, M.D. [facility] patient very much     ____________________________ Carmie Endalph L. Ely III, MD rle:bjt D: 08/07/2012 18:36:49 ET T: 08/09/2012 09:32:02 ET JOB#: [id number]  cc: Carmie Endalph L. Ely III, MD, <Dictator>Ianmichael Amescua Lubertha SouthL. Ely III, MD, <Dictator> Antonieta Ibaimothy J. Gollan, MD Karie Schwalbeichard I. Letvak, MD Christena DeemMartin U. Skulskie, MD Quentin OreALPH L ELY MD ELECTRONICALLY SIGNED 08/11/2012 15:10

## 2015-02-27 NOTE — Consult Note (Signed)
Chief Complaint:   Subjective/Chief Complaint feeling good, denies abdominal pain or distension.  rectal tube out.  tolerating full liquids.   VITAL SIGNS/ANCILLARY NOTES: **Vital Signs.:   28-Sep-13 10:15   Vital Signs Type Q 4hr   Temperature Temperature (F) 98.1   Celsius 36.7   Temperature Source Oral   Pulse Pulse 71   Respirations Respirations 18   Systolic BP Systolic BP 606   Diastolic BP (mmHg) Diastolic BP (mmHg) 73   Mean BP 98   Pulse Ox % Pulse Ox % 96   Pulse Ox Activity Level  At rest   Oxygen Delivery Room Air/ 21 %  *Intake and Output.:   28-Sep-13 01:45   Stool  more runny stool    03:00   Stool  Liquid  brown stool    11:00   Stool  brown, liquid   Brief Assessment:   Cardiac Regular    Respiratory clear BS    Gastrointestinal details normal Soft  Nontender  Nondistended  No masses palpable  Bowel sounds normal   Lab Results: Hepatic:  28-Sep-13 06:23    Bilirubin, Total 0.8   Alkaline Phosphatase 52   SGPT (ALT)  11   SGOT (AST) 19   Total Protein, Serum 6.6   Albumin, Serum  3.2  Routine Chem:  28-Sep-13 06:23    Glucose, Serum 71   BUN 16   Creatinine (comp) 0.96   Sodium, Serum 137   Potassium, Serum  2.9   Chloride, Serum  97   CO2, Serum 28   Calcium (Total), Serum  8.3   Osmolality (calc) 273   eGFR (African American) >60   eGFR (Non-African American) >60 (eGFR values <85mL/min/1.73 m2 may be an indication of chronic kidney disease (CKD). Calculated eGFR is useful in patients with stable renal function. The eGFR calculation will not be reliable in acutely ill patients when serum creatinine is changing rapidly. It is not useful in  patients on dialysis. The eGFR calculation may not be applicable to patients at the low and high extremes of body sizes, pregnant women, and vegetarians.)   Anion Gap 12  Routine Hem:  28-Sep-13 06:23    WBC (CBC) 7.0   RBC (CBC)  4.11   Hemoglobin (CBC) 13.9   Hematocrit (CBC) 40.4   Platelet  Count (CBC) 216   MCV 98   MCH 33.9   MCHC 34.5   RDW 12.8   Neutrophil % 73.3   Lymphocyte % 13.9   Monocyte % 7.7   Eosinophil % 4.6   Basophil % 0.5   Neutrophil # 5.1   Lymphocyte # 1.0   Monocyte # 0.5   Eosinophil # 0.3   Basophil # 0.0 (Result(s) reported on 07 Aug 2012 at 07:01AM.)   Assessment/Plan:  Assessment/Plan:   Assessment 1) sigmoid volvulus-doing very well after colonoscopic decompression.  Plans for surgery noted.    Plan no new GI recs, awaiting surgical disposition.   Electronic Signatures: Loistine Simas (MD)  (Signed 28-Sep-13 13:06)  Authored: Chief Complaint, VITAL SIGNS/ANCILLARY NOTES, Brief Assessment, Lab Results, Assessment/Plan   Last Updated: 28-Sep-13 13:06 by Loistine Simas (MD)

## 2015-02-27 NOTE — Consult Note (Signed)
Brief Consult Note: Diagnosis: Sigmoid volvulous, HTN, Parkinson disease.   Patient was seen by consultant.   Consult note dictated.   Recommend to proceed with surgery or procedure.   Discussed with Attending MD.   Comments: 74y/o M with PMH of HTN and parkinsons disease and constipation issues admitted for sigmoid volvulous. Medical consult requested for medical mgmt  * Sigmoid volvulous- s/p colonoscopy and decompression this am abd is soft now adn doing well remains NPO cont mgmt per surgery If surgery needed- low risk for surgerty, please also see cardiology notes by Dr. Mariah MillingGollan  * HTN- pts bisoprolol/HCTZ was recently stopped due to hyponatremia cont norvasc for now if needed will add oral bisoprolol  * Parkinsons disease- follows with [facility] neurology cont sinemet and requip.  Electronic Signatures: Enid BaasKalisetti, Saraiah Bhat (MD)  (Signed 27-Sep-13 13:59)  Authored: Brief Consult Note   Last Updated: 27-Sep-13 13:59 by Enid BaasKalisetti, Arnel Wymer (MD)

## 2015-02-27 NOTE — Consult Note (Signed)
Full consult to follow. No BM or flatus in 3 days. Pt with Parkinson's disease. Quite distended abdomen with hypoactive BS. Diffusely tender. Will try to urgent colonic decompression this AM. Discussed risks/complication with patient and family, incl perforation and failure to decompress. If not successful, will need surgery. Thanks.  Electronic Signatures: Lutricia Feilh, Tanesha Arambula (MD)  (Signed on 27-Sep-13 07:53)  Authored  Last Updated: 27-Sep-13 07:53 by Lutricia Feilh, Doctor Sheahan (MD)

## 2015-03-03 NOTE — Op Note (Signed)
PATIENT NAME:  Johnny Barnett, [name] MR#:  [id number] DATE OF BIRTH:  04/17/1941  DATE OF PROCEDURE:  06/27/2014  PREOPERATIVE DIAGNOSIS: Visually significant cataract of the right eye.   POSTOPERATIVE DIAGNOSIS: Visually significant cataract of the right eye.   OPERATIVE PROCEDURE: Cataract extraction by phacoemulsification with implant of intraocular lens to right eye.   SURGEON: Galen ManilaWilliam Kevaughn Ewing, MD.   ANESTHESIA:  1. Managed anesthesia care.  2. Topical tetracaine drops followed by 2% Xylocaine jelly applied in the preoperative holding area.   COMPLICATIONS: None.   TECHNIQUE:  Stop and chop.  DESCRIPTION OF PROCEDURE: The patient was examined and consented in the preoperative holding area where the aforementioned topical anesthesia was applied to the right eye and then brought back to the Operating Room where the right eye was prepped and draped in the usual sterile ophthalmic fashion and a lid speculum was placed. A paracentesis was created with the side port blade and the anterior chamber was filled with viscoelastic. A near clear corneal incision was performed with the steel keratome. A continuous curvilinear capsulorrhexis was performed with a cystotome followed by the capsulorrhexis forceps. Hydrodissection and hydrodelineation were carried out with BSS on a blunt cannula. The lens was removed in a stop-and-chop technique and the remaining cortical material was removed with the irrigation-aspiration handpiece. The capsular bag was inflated with viscoelastic and the Tecnis ZCB00 10.5 -diopter lens, serial number [id number] was placed in the capsular bag without complication. The remaining viscoelastic was removed from the eye with the irrigation-aspiration handpiece. The wounds were hydrated. The anterior chamber was flushed with Miostat and the eye was inflated to physiologic pressure. 0.1 mL of cefuroxime concentration 10 mg/mL was placed in the anterior chamber. The wounds were found to be  water tight. The eye was dressed with Vigamox. The patient was given protective glasses to wear throughout the day and a shield with which to sleep tonight. The patient was also given drops with which to begin a drop regimen today and will follow-up with me in one day.    ____________________________ Jerilee FieldWilliam L. Laia Wiley, MD wlp:TT D: 06/27/2014 20:28:33 ET T: 06/27/2014 21:38:11 ET JOB#: [id number]  cc: Yekaterina Escutia L. Akil Hoos, MD, <Dictator> Jerilee FieldWILLIAM L Tametra Ahart MD ELECTRONICALLY SIGNED 06/28/2014 8:51

## 2015-03-03 NOTE — Op Note (Signed)
PATIENT NAME:  Johnny Barnett, Johnny Barnett MR#:  [id number] DATE OF BIRTH:  [birth year]  DATE OF PROCEDURE:  06/13/2014  PREOPERATIVE DIAGNOSIS: Visually significant cataract of the left eye.   POSTOPERATIVE DIAGNOSIS: Visually significant cataract of the left eye.   OPERATIVE PROCEDURE: Cataract extraction by phacoemulsification with implant of intraocular lens to left eye.   SURGEON: Galen ManilaWilliam Deleon Passe, MD.   ANESTHESIA:  1. Managed anesthesia care.  2. Topical tetracaine drops followed by 2% Xylocaine jelly applied in the preoperative holding area.   COMPLICATIONS: None.   TECHNIQUE: Stop and chop.  DESCRIPTION OF PROCEDURE: The patient was examined and consented in the preoperative holding area where the aforementioned topical anesthesia was applied to the left eye and then brought back to the Operating Room where the left eye was prepped and draped in the usual sterile ophthalmic fashion and a lid speculum was placed. A paracentesis was created with the side port blade and the anterior chamber was filled with viscoelastic. A near clear corneal incision was performed with the steel keratome. A continuous curvilinear capsulorrhexis was performed with a cystotome followed by the capsulorrhexis forceps. Hydrodissection and hydrodelineation were carried out with BSS on a blunt cannula. The lens was removed in a stop and chop technique and the remaining cortical material was removed with the irrigation-aspiration handpiece. The capsular bag was inflated with viscoelastic and the Tecnis ZCB00 10.5-diopter lens, serial number [id number] was placed in the capsular bag without complication. The remaining viscoelastic was removed from the eye with the irrigation-aspiration handpiece. The wounds were hydrated. The anterior chamber was flushed with Miostat and the eye was inflated to physiologic pressure. 0.1 mL of cefuroxime concentration 10 mg/mL was placed in the anterior chamber. The wounds were found to be water  tight. The eye was dressed with Vigamox. The patient was given protective glasses to wear throughout the day and a shield with which to sleep tonight. The patient was also given drops with which to begin a drop regimen today and will follow-up with me in one day.    ____________________________ Jerilee FieldWilliam L. Landree Fernholz, MD wlp:ms D: 06/13/2014 22:49:15 ET T: 06/14/2014 05:24:17 ET JOB#: [id number]  cc: Rhys Anchondo L. Margarita Croke, MD, <Dictator> Jerilee FieldWILLIAM L Annete Ayuso MD ELECTRONICALLY SIGNED 06/14/2014 16:34

## 2015-03-05 LAB — SURGICAL PATHOLOGY

## 2015-03-08 ENCOUNTER — Ambulatory Visit (INDEPENDENT_AMBULATORY_CARE_PROVIDER_SITE_OTHER): Payer: Medicare Other | Admitting: Podiatry

## 2015-03-08 ENCOUNTER — Encounter: Payer: Self-pay | Admitting: Podiatry

## 2015-03-08 VITALS — Ht 69.0 in | Wt 170.0 lb

## 2015-03-08 DIAGNOSIS — M79676 Pain in unspecified toe(s): Secondary | ICD-10-CM

## 2015-03-08 DIAGNOSIS — B351 Tinea unguium: Secondary | ICD-10-CM | POA: Diagnosis not present

## 2015-03-11 NOTE — Op Note (Signed)
PATIENT NAME:  Johnny Barnett, Johnny Barnett MR#:  [id number] DATE OF BIRTH:  1941-02-14  DATE OF PROCEDURE:  11/15/2014  PREOPERATIVE DIAGNOSIS:  Chronic sebaceous cyst right upper buttock.   POSTOPERATIVE DIAGNOSIS: Chronic sebaceous cyst right upper buttock.   PROCEDURE PERFORMED: Wide local excision of sebaceous cyst measuring 3 x 3 cm with complex wound closure measuring 10 cm.   SURGEON: Topanga Alvelo A. Egbert GaribaldiBird, MD   ASSISTANT: None.   ANESTHESIA: Local with heavy intravenous sedation.   SPECIMENS: As above.   ESTIMATED BLOOD LOSS: Minimal.   DESCRIPTION OF PROCEDURE: With informed consent, supine position, percutaneous monitoring lines placed, the patient was then positioned prone, padded appropriately. Intravenous sedation was administered. The patient's right buttock around the lesion was sterilely prepped and draped with ChloraPrep solution. Timeout was observed. Local anesthesia totaling 32 mL of 0.5% Marcaine with epinephrine was infiltrated circumferentially to create a field block.   An elliptical incision along the transverse axis of the body was fashioned with scalpel and carried down with electrocautery through the subcutaneous tissues, submitting the specimen in formalin.   The wound was then closed after raising subcutaneous flaps circumferentially with electrocautery in several layers of 3-0 Vicryl inverted interrupted deep dermal, and a combination of simple and vertical mattress 3-0 nylon sutures. A sterile dressing was then applied. The patient was then subsequently returned supine and taken to the recovery room in stable and satisfactory condition by anesthesia.    ____________________________ Redge GainerMark A. Egbert GaribaldiBird, MD mab:LT D: 11/15/2014 14:40:46 ET T: 11/15/2014 21:02:46 ET JOB#: [id number]  cc: Loraine LericheMark A. Egbert GaribaldiBird, MD, <Dictator> Raynald KempMARK A Jasai Sorg MD ELECTRONICALLY SIGNED 11/21/2014 11:42

## 2015-03-12 NOTE — Progress Notes (Signed)
Subjective:     Patient ID: Johnny Barnett, male   DOB: 08/25/1941, [age] y.o.   MRN: [id number]  HPI  74 year old male presents the office they with complete a painful, elongated toenails which he is able to trim himself due to Parkinson's. Denies any redness or drainage along the nail sites. Denies and the other complaints.  Review of Systems  All other systems reviewed and are negative.      Objective:   Physical Exam  AAO 3, NAD  DP/PT pulses palpable, CRT less than 3 seconds  Protective sensation Miley decreased with Simms Weinstein monofilament, aperture sensation decreased, Achilles tendon reflex intact.  Nails are hypertrophic, dystrophic, elongated, brittle, discolored 10. There is tender to palpation along the nails 1-5 bilaterally. No swelling erythema or drainage.  No open lesions or pre-ulcerative lesions identified bilaterally.  No  Pain with calf compression, swelling, warmth, erythema    Assessment:      74 year old male was sent to medical onychomycosis    Plan:     -Treatment options were discussed include alternatives, risks, complications. -Nails sharply debrided 10 without complications/bleeding - discussed the importance of daily foot inspection. Call the office immediately denies any changes or concerns. - follow-up in 3 months or sooner if any palms are to arise. In the meantime call the office with any questions or concerns.

## 2015-05-15 ENCOUNTER — Encounter: Payer: Self-pay | Admitting: Internal Medicine

## 2015-05-15 ENCOUNTER — Ambulatory Visit (INDEPENDENT_AMBULATORY_CARE_PROVIDER_SITE_OTHER): Payer: Medicare Other | Admitting: Internal Medicine

## 2015-05-15 VITALS — BP 138/70 | HR 68 | Temp 98.0°F | Wt 167.0 lb

## 2015-05-15 DIAGNOSIS — H6123 Impacted cerumen, bilateral: Secondary | ICD-10-CM | POA: Insufficient documentation

## 2015-05-15 NOTE — Progress Notes (Signed)
Subjective:    Patient ID: Johnny Barnett, male    DOB: 10/26/1941, 74 y.o.   MRN: [id number]  HPI Here due to ear pain  Had sudden echo when speaking "sounds like I am in a barrel" Hearing is definitely decreased No pain  No recent illness or sinus symptoms Stopped the flonase briefly --but got more head congestion so restarted about a month ago  No swimming or exposure of anything to them Has dull sensation inside  Current Outpatient Prescriptions on File Prior to Visit  Medication Sig Dispense Refill  . amLODipine (NORVASC) 5 MG tablet TAKE 1 TABLET DAILY 90 tablet 1  . aspirin 81 MG EC tablet Take 81 mg by mouth daily.      . Carbidopa-Levodopa ER (SINEMET CR) 25-100 MG tablet controlled release Take by mouth.    . fluticasone (FLONASE) 50 MCG/ACT nasal spray PLACE 2 SPRAYS INTO EACH NOSTRIL DAILY. 16 g 11  . gabapentin (NEURONTIN) 100 MG capsule Take 2 caps at 7am, 2 caps at 2 pm and 1 cap at 10 pm    . Glucosamine-Chondroit-Vit C-Mn (GLUCOSAMINE CHONDR 1500 COMPLX) CAPS Take by mouth daily.      Marland Kitchen. losartan (COZAAR) 50 MG tablet TAKE 1 TABLET BY MOUTH DAILY 90 tablet 3  . Multiple Vitamin (MULTIVITAMIN) tablet Take 1 tablet by mouth daily.      Marland Kitchen. rOPINIRole (REQUIP) 1 MG tablet Take by mouth.    . sennosides-docusate sodium (SENOKOT-S) 8.6-50 MG tablet Take 2 tablets by mouth daily.    Marland Kitchen. triamcinolone cream (KENALOG) 0.1 % APPLY TWICE A DAY AS NEEDED FOR ITCHY AREAS 30 g 1   No current facility-administered medications on file prior to visit.    No Known Allergies  Past Medical History  Diagnosis Date  . Hyperlipidemia   . ED (erectile dysfunction)   . Degenerative joint disease   . Parkinson disease     Past Surgical History  Procedure Laterality Date  . Flexible sigmoidoscopy  08/05  . Sigmoid volvulus resection  10/13    Dr Michela PitcherEly  . Cataract extraction w/ intraocular lens implant Bilateral     Family History  Problem Relation Age of Onset  . Diabetes  Paternal Uncle   . Coronary artery disease Neg Hx   . Hypertension Neg Hx     History   Social History  . Marital Status: Married    Spouse Name: N/A  . Number of Children: 2  . Years of Education: N/A   Occupational History  . 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 Smoking status: Never Smoker   . Smokeless tobacco: Never Used  . Alcohol Use: Yes     Comment: wine  . Drug Use: No  . Sexual Activity: Not on file   Other Topics Concern  . 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

## 2015-05-15 NOTE — Patient Instructions (Signed)
Please try debrox (OTC) or 1/2 strength hydrogen peroxide dropped in ears once or twice a week to prevent wax buildup again

## 2015-05-15 NOTE — Assessment & Plan Note (Signed)
Cleared here with flush Echo is better Mild upper TM inflammation--observe only Discussed debrox

## 2015-05-15 NOTE — Progress Notes (Signed)
Pre visit review using our clinic review tool, if applicable. No additional management support is needed unless otherwise documented below in the visit note. 

## 2015-05-24 ENCOUNTER — Encounter: Payer: Self-pay | Admitting: Gastroenterology

## 2015-06-07 ENCOUNTER — Ambulatory Visit (INDEPENDENT_AMBULATORY_CARE_PROVIDER_SITE_OTHER): Payer: Medicare Other | Admitting: Podiatry

## 2015-06-07 DIAGNOSIS — B351 Tinea unguium: Secondary | ICD-10-CM

## 2015-06-07 DIAGNOSIS — M79676 Pain in unspecified toe(s): Secondary | ICD-10-CM | POA: Diagnosis not present

## 2015-06-07 NOTE — Progress Notes (Signed)
Subjective: 74 y.o. returns the office today for painful, elongated, thickened toenails which he is unable to trim herself due to Parkinson's. Denies any redness or drainage around the nails. Denies any acute changes since last appointment and no new complaints today. Denies any systemic complaints such as fevers, chills, nausea, vomiting.   Objective: AAO 3, NAD DP/PT pulses palpable, CRT less than 3 seconds Nails hypertrophic, dystrophic, elongated, brittle, discolored 10. There is tenderness overlying the nails 1-5 bilaterally. There is no surrounding erythema or drainage along the nail sites. No open lesions or pre-ulcerative lesions are identified. No other areas of tenderness bilateral lower extremities. No overlying edema, erythema, increased warmth. No pain with calf compression, swelling, warmth, erythema.  Assessment: Patient presents with symptomatic onychomycosis  Plan: -Treatment options including alternatives, risks, complications were discussed -Nails sharply debrided 10 without complication/bleeding. -Discussed daily foot inspection. If there are any changes, to call the office immediately.  -Follow-up in 3 months or sooner if any problems are to arise. In the meantime, encouraged to call the office with any questions, concerns, changes symptoms.   Ovid Curd, DPM

## 2015-06-18 ENCOUNTER — Encounter: Payer: Self-pay | Admitting: Internal Medicine

## 2015-06-18 ENCOUNTER — Ambulatory Visit (INDEPENDENT_AMBULATORY_CARE_PROVIDER_SITE_OTHER): Payer: Medicare Other | Admitting: Internal Medicine

## 2015-06-18 ENCOUNTER — Ambulatory Visit (INDEPENDENT_AMBULATORY_CARE_PROVIDER_SITE_OTHER)
Admission: RE | Admit: 2015-06-18 | Discharge: 2015-06-18 | Disposition: A | Discharge status: Home / Self Care | Payer: Medicare Other | Source: Ambulatory Visit | Attending: Internal Medicine | Admitting: Internal Medicine

## 2015-06-18 VITALS — BP 106/62 | HR 60 | Temp 97.5°F | Wt 167.0 lb

## 2015-06-18 DIAGNOSIS — M25512 Pain in left shoulder: Secondary | ICD-10-CM

## 2015-06-18 NOTE — Progress Notes (Signed)
Pre visit review using our clinic review tool, if applicable. No additional management support is needed unless otherwise documented below in the visit note. 

## 2015-06-18 NOTE — Progress Notes (Signed)
Subjective:    Patient ID: Johnny Barnett, male    DOB: [birth year], 74 y.o.   MRN: [id number]  HPI  Pt presents to the clinic today with c/o left shoulder pain. This started 2-3 weeks ago. He noticed the pain after he lifted something heavy. He describes the pain as a constant dull achy, but can be sharp and shooting. He feels like the muscles are very tight. Since that time, he has had trouble lifting his left arm above his head. He has also had trouble eating because he uses utensils in his left hand. He has used an ice pack with minimal relief. He denies numbness or tingling in his left arm.  Review of Systems      Past Medical History  Diagnosis Date  . Hyperlipidemia   . ED (erectile dysfunction)   . Degenerative joint disease   . Parkinson disease     Current Outpatient Prescriptions  Medication Sig Dispense Refill  . amLODipine (NORVASC) 5 MG tablet TAKE 1 TABLET DAILY 90 tablet 1  . aspirin 81 MG EC tablet Take 81 mg by mouth daily.      . carbidopa-levodopa (SINEMET IR) 25-100 MG per tablet Take 1.5 tabs at 730, 1130 and 330. Take one tab at 630.    . Carbidopa-Levodopa ER (SINEMET CR) 25-100 MG tablet controlled release Take by mouth.    . fluticasone (FLONASE) 50 MCG/ACT nasal spray PLACE 2 SPRAYS INTO EACH NOSTRIL DAILY. 16 g 11  . gabapentin (NEURONTIN) 100 MG capsule Take 2 caps at 7am, 2 caps at 2 pm and 1 cap at 10 pm    . Glucosamine-Chondroit-Vit C-Mn (GLUCOSAMINE CHONDR 1500 COMPLX) CAPS Take by mouth daily.      Marland Kitchen losartan (COZAAR) 50 MG tablet TAKE 1 TABLET BY MOUTH DAILY 90 tablet 3  . Multiple Vitamin (MULTIVITAMIN) tablet Take 1 tablet by mouth daily.      Marland Kitchen rOPINIRole (REQUIP) 1 MG tablet Take by mouth.    . sennosides-docusate sodium (SENOKOT-S) 8.6-50 MG tablet Take 2 tablets by mouth daily.    Marland Kitchen triamcinolone cream (KENALOG) 0.1 % APPLY TWICE A DAY AS NEEDED FOR ITCHY AREAS 30 g 1   No current facility-administered medications for this visit.    No  Known Allergies  Family History  Problem Relation Age of Onset  . Diabetes Paternal Uncle   . Coronary artery disease Neg Hx   . Hypertension Neg Hx     History   Social History  . Marital Status: Married    Spouse Name: N/A  . Number of Children: 2  . Years of Education: N/A   Occupational History  . 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 Smoking status: Never Smoker   . Smokeless tobacco: Never Used  . Alcohol Use: Yes     Comment: wine  . Drug Use: No  . Sexual Activity: Not on file   Other Topics Concern  . 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  Respiratory: Denies difficulty breathing, shortness of breath, cough or sputum production.   Cardiovascular: Denies chest pain, chest tightness, palpitations or swelling in the hands or feet.  Musculoskeletal: Pt reports left shoulder pain. Denies difficulty with gait, muscle pain or joint swelling.  Skin: Denies redness, rashes, lesions or ulcercations.    No other specific complaints in a complete review of systems (except as listed in HPI above).  Objective:   Physical Exam   BP 106/62 mmHg  Pulse 60  Temp(Src) 97.5 F (36.4 C) (Oral)  Wt 167 lb (75.751 kg)  SpO2 98% Wt Readings from Last 3 Encounters:  06/18/15 167 lb (75.751 kg)  05/15/15 167 lb (75.751 kg)  03/08/15 170 lb (77.111 kg)    General: Appears his stated age,  in NAD. Cardiovascular: Normal rate and rhythm. S1,S2 noted.  No murmur, rubs or gallops noted.  Pulmonary/Chest: Normal effort and positive vesicular breath  sounds. No respiratory distress. No wheezes, rales or ronchi noted.  Musculoskeletal: Decreased internal rotation. Normal external rotation of the left shoulder. No pain with palpation of the left AC joint or subacromial bursa. Positive drop can test on the left. Hand grips equal. Neurological: Alert and oriented.   BMET    Component Value Date/Time   NA 135* 11/07/2014 1216   NA 130* 08/02/2014 1030   K 4.0 11/07/2014 1216   K 4.3 08/02/2014 1030   CL 99 11/07/2014 1216   CL 94* 08/02/2014 1030   CO2 30 11/07/2014 1216   CO2 31 08/02/2014 1030   GLUCOSE 93 11/07/2014 1216   GLUCOSE 65* 08/02/2014 1030   BUN 22* 11/07/2014 1216   BUN 20 08/02/2014 1030   CREATININE 0.95 11/07/2014 1216   CREATININE 1.0 08/02/2014 1030   CALCIUM 8.5 11/07/2014 1216   CALCIUM 9.0 08/02/2014 1030   GFRNONAA >60 08/18/2012 0436   GFRNONAA 70.61 10/15/2009 1605   GFRAA >60 08/18/2012 0436    Lipid Panel     Component Value Date/Time   CHOL 183 08/02/2014 1030   TRIG 99.0 08/02/2014 1030   HDL 39.60 08/02/2014 1030   CHOLHDL 5 08/02/2014 1030   VLDL 19.8 08/02/2014 1030   LDLCALC 124* 08/02/2014 1030    CBC    Component Value Date/Time   WBC 6.1 08/02/2014 1030   WBC 9.3 08/12/2012 0448   RBC 4.29 08/02/2014 1030   RBC 3.77* 08/12/2012 0448   HGB 14.1 08/02/2014 1030   HGB 13.0 08/12/2012 0448   HCT 41.3 08/02/2014 1030   HCT 37.1* 08/12/2012 0448   PLT 231.0 08/02/2014 1030   PLT 285 08/16/2012 1545   MCV 96.1 08/02/2014 1030   MCV 98 08/12/2012 0448   MCH 34.5* 08/12/2012 0448   MCHC 34.2 08/02/2014 1030   MCHC 35.1 08/12/2012 0448   RDW 13.1 08/02/2014 1030   RDW 12.5 08/12/2012 0448   LYMPHSABS 1.1 08/02/2014 1030   LYMPHSABS 0.8* 08/12/2012 0448   MONOABS 0.4 08/02/2014 1030   MONOABS 0.4 08/12/2012 0448   EOSABS 0.3 08/02/2014 1030   EOSABS 0.3 08/12/2012 0448   BASOSABS 0.0 08/02/2014 1030   BASOSABS 0.0 08/12/2012 0448    Hgb A1C No results found for:  HGBA1C      Assessment & Plan:   Left shoulder pain:  Concern for a rotator cuff tear Will obtain xray today MRI of left shoulder will be ordered as well Discussed treatment, PT versus surgery, but we will wait until imaging is back to decide He  declines RX for pain medication today Advised him to avoid any heavy lifting  RTC as needed

## 2015-06-18 NOTE — Patient Instructions (Signed)
Rotator Cuff Injury Rotator cuff injury is any type of injury to the set of muscles and tendons that make up the stabilizing unit of your shoulder. This unit holds the ball of your upper arm bone (humerus) in the socket of your shoulder blade (scapula).  CAUSES Injuries to your rotator cuff most commonly come from sports or activities that cause your arm to be moved repeatedly over your head. Examples of this include throwing, weight lifting, swimming, or racquet sports. Long lasting (chronic) irritation of your rotator cuff can cause soreness and swelling (inflammation), bursitis, and eventual damage to your tendons, such as a tear (rupture). SIGNS AND SYMPTOMS Acute rotator cuff tear:  Sudden tearing sensation followed by severe pain shooting from your upper shoulder down your arm toward your elbow.  Decreased range of motion of your shoulder because of pain and muscle spasm.  Severe pain.  Inability to raise your arm out to the side because of pain and loss of muscle power (large tears). Chronic rotator cuff tear:  Pain that usually is worse at night and may interfere with sleep.  Gradual weakness and decreased shoulder motion as the pain worsens.  Decreased range of motion. Rotator cuff tendinitis:  Deep ache in your shoulder and the outside upper arm over your shoulder.  Pain that comes on gradually and becomes worse when lifting your arm to the side or turning it inward. DIAGNOSIS Rotator cuff injury is diagnosed through a medical history, physical exam, and imaging exam. The medical history helps determine the type of rotator cuff injury. Your health care provider will look at your injured shoulder, feel the injured area, and ask you to move your shoulder in different positions. X-ray exams typically are done to rule out other causes of shoulder pain, such as fractures. MRI is the exam of choice for the most severe shoulder injuries because the images show muscles and tendons.    TREATMENT  Chronic tear:  Medicine for pain, such as acetaminophen or ibuprofen.  Physical therapy and range-of-motion exercises may be helpful in maintaining shoulder function and strength.  Steroid injections into your shoulder joint.  Surgical repair of the rotator cuff if the injury does not heal with noninvasive treatment. Acute tear:  Anti-inflammatory medicines such as ibuprofen and naproxen to help reduce pain and swelling.  A sling to help support your arm and rest your rotator cuff muscles. Long-term use of a sling is not advised. It may cause significant stiffening of the shoulder joint.  Surgery may be considered within a few weeks, especially in younger, active people, to return the shoulder to full function.  Indications for surgical treatment include the following:  Age younger than 60 years.  Rotator cuff tears that are complete.  Physical therapy, rest, and anti-inflammatory medicines have been used for 6-8 weeks, with no improvement.  Employment or sporting activity that requires constant shoulder use. Tendinitis:  Anti-inflammatory medicines such as ibuprofen and naproxen to help reduce pain and swelling.  A sling to help support your arm and rest your rotator cuff muscles. Long-term use of a sling is not advised. It may cause significant stiffening of the shoulder joint.  Severe tendinitis may require:  Steroid injections into your shoulder joint.  Physical therapy.  Surgery. HOME CARE INSTRUCTIONS   Apply ice to your injury:  Put ice in a plastic bag.  Place a towel between your skin and the bag.  Leave the ice on for 20 minutes, 2-3 times a day.  If you   have a shoulder immobilizer (sling and straps), wear it until told otherwise by your health care provider.  You may want to sleep on several pillows or in a recliner at night to lessen swelling and pain.  Only take over-the-counter or prescription medicines for pain, discomfort, or fever as  directed by your health care provider.  Do simple hand squeezing exercises with a soft rubber ball to decrease hand swelling. SEEK MEDICAL CARE IF:   Your shoulder pain increases, or new pain or numbness develops in your arm, hand, or fingers.  Your hand or fingers are colder than your other hand. SEEK IMMEDIATE MEDICAL CARE IF:   Your arm, hand, or fingers are numb or tingling.  Your arm, hand, or fingers are increasingly swollen and painful, or they turn white or blue. MAKE SURE YOU:  Understand these instructions.  Will watch your condition.  Will get help right away if you are not doing well or get worse. Document Released: 10/24/2000 Document Revised: 11/01/2013 Document Reviewed: 06/08/2013 ExitCare Patient Information 2015 [employer]. This information is not intended to replace advice given to you by your health care provider. Make sure you discuss any questions you have with your health care provider.  

## 2015-06-23 ENCOUNTER — Ambulatory Visit
Admission: RE | Admit: 2015-06-23 | Discharge: 2015-06-23 | Disposition: A | Discharge status: Home / Self Care | Payer: Medicare Other | Source: Ambulatory Visit | Attending: Internal Medicine | Admitting: Internal Medicine

## 2015-06-23 DIAGNOSIS — M7582 Other shoulder lesions, left shoulder: Secondary | ICD-10-CM | POA: Insufficient documentation

## 2015-06-23 DIAGNOSIS — S46812A Strain of other muscles, fascia and tendons at shoulder and upper arm level, left arm, initial encounter: Secondary | ICD-10-CM | POA: Insufficient documentation

## 2015-06-23 DIAGNOSIS — M25512 Pain in left shoulder: Secondary | ICD-10-CM | POA: Diagnosis present

## 2015-06-25 ENCOUNTER — Other Ambulatory Visit: Payer: Self-pay | Admitting: Internal Medicine

## 2015-06-25 DIAGNOSIS — M25512 Pain in left shoulder: Secondary | ICD-10-CM

## 2015-08-08 ENCOUNTER — Other Ambulatory Visit: Payer: Self-pay | Admitting: Internal Medicine

## 2015-08-13 ENCOUNTER — Encounter: Payer: Medicare Other | Admitting: Internal Medicine

## 2015-08-15 ENCOUNTER — Encounter: Payer: Self-pay | Admitting: Internal Medicine

## 2015-08-15 ENCOUNTER — Ambulatory Visit (INDEPENDENT_AMBULATORY_CARE_PROVIDER_SITE_OTHER): Payer: Medicare Other | Admitting: Internal Medicine

## 2015-08-15 VITALS — BP 120/60 | HR 66 | Temp 97.6°F | Ht 69.0 in | Wt 165.0 lb

## 2015-08-15 DIAGNOSIS — K5909 Other constipation: Secondary | ICD-10-CM | POA: Diagnosis not present

## 2015-08-15 DIAGNOSIS — Z23 Encounter for immunization: Secondary | ICD-10-CM

## 2015-08-15 DIAGNOSIS — L570 Actinic keratosis: Secondary | ICD-10-CM | POA: Diagnosis not present

## 2015-08-15 DIAGNOSIS — G20 Parkinson's disease: Secondary | ICD-10-CM

## 2015-08-15 DIAGNOSIS — Z0000 Encounter for general adult medical examination without abnormal findings: Secondary | ICD-10-CM

## 2015-08-15 DIAGNOSIS — M75102 Unspecified rotator cuff tear or rupture of left shoulder, not specified as traumatic: Secondary | ICD-10-CM

## 2015-08-15 DIAGNOSIS — G20A1 Parkinson's disease without dyskinesia, without mention of fluctuations: Secondary | ICD-10-CM

## 2015-08-15 DIAGNOSIS — I10 Essential (primary) hypertension: Secondary | ICD-10-CM

## 2015-08-15 DIAGNOSIS — Z7189 Other specified counseling: Secondary | ICD-10-CM

## 2015-08-15 DIAGNOSIS — R413 Other amnesia: Secondary | ICD-10-CM

## 2015-08-15 LAB — CBC WITH DIFFERENTIAL/PLATELET
BASOS ABS: 0 10*3/uL (ref 0.0–0.1)
Basophils Relative: 0.5 % (ref 0.0–3.0)
EOS PCT: 5 % (ref 0.0–5.0)
Eosinophils Absolute: 0.3 10*3/uL (ref 0.0–0.7)
HEMATOCRIT: 40.8 % (ref 39.0–52.0)
Hemoglobin: 13.7 g/dL (ref 13.0–17.0)
LYMPHS PCT: 20.5 % (ref 12.0–46.0)
Lymphs Abs: 1.4 10*3/uL (ref 0.7–4.0)
MCHC: 33.6 g/dL (ref 30.0–36.0)
MCV: 97.7 fl (ref 78.0–100.0)
MONOS PCT: 6.1 % (ref 3.0–12.0)
Monocytes Absolute: 0.4 10*3/uL (ref 0.1–1.0)
NEUTROS ABS: 4.6 10*3/uL (ref 1.4–7.7)
Neutrophils Relative %: 67.9 % (ref 43.0–77.0)
PLATELETS: 215 10*3/uL (ref 150.0–400.0)
RBC: 4.17 Mil/uL — ABNORMAL LOW (ref 4.22–5.81)
RDW: 13.5 % (ref 11.5–15.5)
WBC: 6.7 10*3/uL (ref 4.0–10.5)

## 2015-08-15 LAB — COMPREHENSIVE METABOLIC PANEL
ALT: 6 U/L (ref 0–53)
AST: 13 U/L (ref 0–37)
Albumin: 3.9 g/dL (ref 3.5–5.2)
Alkaline Phosphatase: 52 U/L (ref 39–117)
BUN: 25 mg/dL — ABNORMAL HIGH (ref 6–23)
CALCIUM: 9.2 mg/dL (ref 8.4–10.5)
CHLORIDE: 96 meq/L (ref 96–112)
CO2: 32 meq/L (ref 19–32)
Creatinine, Ser: 1.08 mg/dL (ref 0.40–1.50)
GFR: 70.94 mL/min (ref >60.00)
Glucose, Bld: 77 mg/dL (ref 70–99)
POTASSIUM: 4.3 meq/L (ref 3.5–5.1)
Sodium: 132 mEq/L — ABNORMAL LOW (ref 135–145)
Total Bilirubin: 0.4 mg/dL (ref 0.2–1.2)
Total Protein: 7.2 g/dL (ref 6.0–8.3)

## 2015-08-15 MED ORDER — TETANUS-DIPHTHERIA TOXOIDS TD 5-2 LFU IM INJ: 0.5000 mL | INJECTION | Freq: Once | INTRAMUSCULAR | Status: DC

## 2015-08-15 NOTE — Progress Notes (Signed)
Subjective:    Patient ID: Johnny Barnett, male    DOB: Jan 15, 1941, 74 y.o.   MRN: [id number]  HPI Here for Medicare wellness visit and follow up of chronic medical conditions Wife is here Reviewed form and advanced directives Reviewed other doctors 1-2 glasses of wine nightly No tobacco Regular exercise Independent with instrumental ADLs--still drives, but very limited No falls No depression or anhedonia Mild memory problems as noted  Having ongoing problems with left shoulder He lifted a cultivator horse drawn plow--- extended arm and tried to pick it up Now ready to see an orthopedist  Has a sore on his mouth--goes back a year Just won't heal Tries to avoid it with razor but knocks scab off and it persists  Has gas and white mucus from rectum Happens more often when walking or exercising Uses pad  Uses stool softener/senna and bowels are regular (usually 1 daily on average) No upper GI gas  Recent visit with Dr Johnny Barnett No med changes No functional changes in the past year---though mildly more symptomatic (speech especially)  No chest pain No SOB Exercise tolerance is stable No syncope Chronic dizziness has not changed  Memory is still not great Notices short term memory problems No apparent progression--or minimally (according to wife)  Current Outpatient Prescriptions on File Prior to Visit  Medication Sig Dispense Refill  . amLODipine (NORVASC) 5 MG tablet TAKE 1 TABLET DAILY 90 tablet 1  . aspirin 81 MG EC tablet Take 81 mg by mouth daily.      . carbidopa-levodopa (SINEMET IR) 25-100 MG per tablet Take 1.5 tabs at 730, 1130 and 330. Take one tab at 630.    . fluticasone (FLONASE) 50 MCG/ACT nasal spray PLACE 2 SPRAYS INTO EACH NOSTRIL DAILY. 16 g 11  . gabapentin (NEURONTIN) 100 MG capsule Take 2 caps at 7am, 2 caps at 2 pm and 1 cap at 10 pm    . losartan (COZAAR) 50 MG tablet TAKE 1 TABLET BY MOUTH DAILY 90 tablet 3  . Multiple Vitamin (MULTIVITAMIN)  tablet Take 1 tablet by mouth daily.      Marland Kitchen rOPINIRole (REQUIP) 1 MG tablet Take 1 mg by mouth 3 (three) times daily.     Marland Kitchen triamcinolone cream (KENALOG) 0.1 % APPLY TWICE A DAY AS NEEDED FOR ITCHY AREAS 30 g 1   No current facility-administered medications on file prior to visit.    No Known Allergies  Past Medical History  Diagnosis Date  . Hyperlipidemia   . ED (erectile dysfunction)   . Degenerative joint disease   . Parkinson disease [facility]ett)     Past Surgical History  Procedure Laterality Date  . Flexible sigmoidoscopy  08/05  . Sigmoid volvulus resection  10/13    Dr Johnny Barnett  . Cataract extraction w/ intraocular lens implant Bilateral   . Cyst removal trunk  1/16    Dr Johnny Barnett    Family History  Problem Relation Age of Onset  . Diabetes Paternal Uncle   . Coronary artery disease Neg Hx   . Hypertension Neg Hx     Social History   Social History  . Marital Status: Married    Spouse Name: N/A  . Number of Children: 2  . Years of Education: N/A   Occupational History  . 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 Smoking status: Never Smoker   . Smokeless tobacco: Never Used  . Alcohol Use: Yes  Comment: wine  . Drug Use: No  . Sexual Activity: Not on file   Other Topics Concern  . 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 Left wrist pain is stable     Objective:   Physical Exam  Constitutional: He is oriented to person, place,  and time. He appears well-developed and well-nourished. No distress.  HENT:  Mouth/Throat: Oropharynx is clear and moist. No oropharyngeal exudate.  Neck: Normal range of motion. Neck supple. No thyromegaly present.  Cardiovascular: Normal rate, regular rhythm, normal heart sounds and intact distal pulses.  Exam reveals no gallop.   No murmur heard. Pulmonary/Chest: Effort normal and breath sounds normal. No respiratory distress. He has no wheezes. He has no rales.  Abdominal: Soft. There is no tenderness.  Musculoskeletal: He exhibits no edema.  Lymphadenopathy:    He has no cervical adenopathy.  Neurological: He is alert and oriented to person, place, and time.  President-- "[employer]" [phone] [id number] Recall 3/3  Bradykinesia No sig tremor  Skin: No rash noted. No erythema.  6mm actinic in mid upper lip  Psychiatric: He has a normal mood and affect. His behavior is normal.          Assessment & Plan:

## 2015-08-15 NOTE — Assessment & Plan Note (Signed)
Liquid nitrogen 45 seconds x 2 Discussed home care 

## 2015-08-15 NOTE — Assessment & Plan Note (Signed)
Mild only Wife notes no major progression

## 2015-08-15 NOTE — Assessment & Plan Note (Signed)
And gas Gave info on low FODMAP diet

## 2015-08-15 NOTE — Progress Notes (Signed)
Pre visit review using our clinic review tool, if applicable. No additional management support is needed unless otherwise documented below in the visit note. 

## 2015-08-15 NOTE — Assessment & Plan Note (Signed)
Will send to ortho

## 2015-08-15 NOTE — Assessment & Plan Note (Signed)
BP Readings from Last 3 Encounters:  08/15/15 120/60  06/18/15 106/62  05/15/15 138/70   Good control No changes If worsening dizziness, would cut back on meds

## 2015-08-15 NOTE — Assessment & Plan Note (Signed)
See social history 

## 2015-08-15 NOTE — Addendum Note (Signed)
Addended by: Sueanne Margarita on: 08/15/2015 03:16 PM   Modules accepted: Orders

## 2015-08-15 NOTE — Assessment & Plan Note (Signed)
I have personally reviewed the Medicare Annual Wellness questionnaire and have noted 1. The patient's medical and social history 2. Their use of alcohol, tobacco or illicit drugs 3. Their current medications and supplements 4. The patient's functional ability including ADL's, fall risks, home safety risks and hearing or visual             impairment. 5. Diet and physical activities 6. Evidence for depression or mood disorders  The patients weight, height, BMI and visual acuity have been recorded in the chart I have made referrals, counseling and provided education to the patient based review of the above and I have provided the pt with a written personalized care plan for preventive services.  I have provided you with a copy of your personalized plan for preventive services. Please take the time to review along with your updated medication list.  Flu vaccine today Rx for Td UTD on colon No PSA due to age

## 2015-08-15 NOTE — Assessment & Plan Note (Signed)
Still with just mild functional limitations Keeps up with Dr Shanda Howells

## 2015-09-11 ENCOUNTER — Other Ambulatory Visit: Payer: Self-pay | Admitting: Internal Medicine

## 2015-09-13 ENCOUNTER — Ambulatory Visit (INDEPENDENT_AMBULATORY_CARE_PROVIDER_SITE_OTHER): Payer: Medicare Other | Admitting: Podiatry

## 2015-09-13 ENCOUNTER — Encounter: Payer: Self-pay | Admitting: Podiatry

## 2015-09-13 DIAGNOSIS — B351 Tinea unguium: Secondary | ICD-10-CM

## 2015-09-13 DIAGNOSIS — M79676 Pain in unspecified toe(s): Secondary | ICD-10-CM

## 2015-09-13 NOTE — Progress Notes (Signed)
Patient ID: Johnny SalmGeorge [name], male   DOB: 06/20/1941, 74 y.o.   MRN: [id number]   Subjective: 74 y.o. returns the office today for painful, elongated, thickened toenails which he is unable to trim himself. Denies any redness or drainage around the nails. Denies any acute changes since last appointment and no new complaints today. Denies any systemic complaints such as fevers, chills, nausea, vomiting.   Objective: AAO 3, NAD DP/PT pulses palpable, CRT less than 3 seconds Nails hypertrophic, dystrophic, elongated, brittle, discolored 10. There is tenderness overlying the nails 1-5 bilaterally. There is no surrounding erythema or drainage along the nail sites. No open lesions or pre-ulcerative lesions are identified. No other areas of tenderness bilateral lower extremities. No overlying edema, erythema, increased warmth. No pain with calf compression, swelling, warmth, erythema.  Assessment: Patient presents with symptomatic onychomycosis  Plan: -Treatment options including alternatives, risks, complications were discussed -Nails sharply debrided 10 without complication/bleeding. -Discussed daily foot inspection. If there are any changes, to call the office immediately.  -Follow-up in 3 months or sooner if any problems are to arise. In the meantime, encouraged to call the office with any questions, concerns, changes symptoms.  Johnny CurdMatthew Tayte Barnett, DPM

## 2015-11-23 ENCOUNTER — Other Ambulatory Visit: Payer: Self-pay

## 2015-11-23 MED ORDER — LOSARTAN POTASSIUM 50 MG PO TABS: 50.0000 mg | ORAL_TABLET | Freq: Every day | ORAL | Status: DC

## 2015-11-23 MED ORDER — AMLODIPINE BESYLATE 5 MG PO TABS: 5.0000 mg | ORAL_TABLET | Freq: Every day | ORAL | Status: DC

## 2015-11-23 NOTE — Telephone Encounter (Signed)
[employer] [facility] left v/m; [facility] said does not have refills for transfer for amlodipine, and losartan. Last annual 08/2015. Refill done per protocol.

## 2015-12-13 ENCOUNTER — Ambulatory Visit: Payer: Medicare Other | Admitting: Podiatry

## 2015-12-18 ENCOUNTER — Ambulatory Visit: Payer: Medicare Other | Admitting: Podiatry

## 2015-12-20 ENCOUNTER — Ambulatory Visit (INDEPENDENT_AMBULATORY_CARE_PROVIDER_SITE_OTHER): Payer: Medicare Other | Admitting: Podiatry

## 2015-12-20 ENCOUNTER — Encounter: Payer: Self-pay | Admitting: Podiatry

## 2015-12-20 DIAGNOSIS — B351 Tinea unguium: Secondary | ICD-10-CM | POA: Diagnosis not present

## 2015-12-20 DIAGNOSIS — M79676 Pain in unspecified toe(s): Secondary | ICD-10-CM

## 2015-12-20 NOTE — Progress Notes (Signed)
Patient ID: Johnny Barnett, male   DOB: 07/16/1941, 74 y.o.   MRN: [id number]   Subjective: 74 y.o. returns the office today for painful, elongated, thickened toenails which he is unable to trim himself. Denies any redness or drainage around the nails. Denies any acute changes since last appointment and no new complaints today. Denies any systemic complaints such as fevers, chills, nausea, vomiting.   Objective: AAO 3, NAD DP/PT pulses palpable, CRT less than 3 seconds Nails hypertrophic, dystrophic, elongated, brittle, discolored 10. There is tenderness overlying the nails 1-5 bilaterally. There is no surrounding erythema or drainage along the nail sites. No open lesions or pre-ulcerative lesions are identified. No other areas of tenderness bilateral lower extremities. No overlying edema, erythema, increased warmth. No pain with calf compression, swelling, warmth, erythema.  Assessment: Patient presents with symptomatic onychomycosis  Plan: -Treatment options including alternatives, risks, complications were discussed -Nails sharply debrided 10 without complication/bleeding. -Discussed daily foot inspection. If there are any changes, to call the office immediately.  -Follow-up in 3 months or sooner if any problems are to arise. In the meantime, encouraged to call the office with any questions, concerns, changes symptoms.  Matthew Wagoner, DPM  

## 2016-01-19 ENCOUNTER — Other Ambulatory Visit: Payer: Self-pay | Admitting: Internal Medicine

## 2016-03-20 ENCOUNTER — Encounter: Payer: Self-pay | Admitting: Podiatry

## 2016-03-20 ENCOUNTER — Ambulatory Visit (INDEPENDENT_AMBULATORY_CARE_PROVIDER_SITE_OTHER): Payer: Medicare Other | Admitting: Podiatry

## 2016-03-20 DIAGNOSIS — B351 Tinea unguium: Secondary | ICD-10-CM

## 2016-03-20 DIAGNOSIS — M79676 Pain in unspecified toe(s): Secondary | ICD-10-CM | POA: Diagnosis not present

## 2016-03-20 NOTE — Progress Notes (Signed)
Patient ID: Johnny Barnett, male   DOB: 06/13/1941, 75 y.o.   MRN: [id number]  Subjective: 75 y.o. returns the office today for painful, elongated, thickened toenails which he is unable to trim himself. Denies any redness or drainage around the nails. Denies any acute changes since last appointment and no new complaints today. Denies any systemic complaints such as fevers, chills, nausea, vomiting.   Objective: AAO 3, NAD DP/PT pulses palpable, CRT less than 3 seconds Nails hypertrophic, dystrophic, elongated, brittle, discolored 10. There is tenderness overlying the nails 1-5 bilaterally. There is no surrounding erythema or drainage along the nail sites. No open lesions or pre-ulcerative lesions are identified. No other areas of tenderness bilateral lower extremities. No overlying edema, erythema, increased warmth. No pain with calf compression, swelling, warmth, erythema.  Assessment: Patient presents with symptomatic onychomycosis  Plan: -Treatment options including alternatives, risks, complications were discussed -Nails sharply debrided 10 without complication/bleeding. -Discussed daily foot inspection. If there are any changes, to call the office immediately.  -Follow-up in 3 months or sooner if any problems are to arise. In the meantime, encouraged to call the office with any questions, concerns, changes symptoms.  Ovid CurdMatthew Wagoner, DPM

## 2016-06-26 ENCOUNTER — Encounter: Payer: Self-pay | Admitting: Podiatry

## 2016-06-26 ENCOUNTER — Ambulatory Visit (INDEPENDENT_AMBULATORY_CARE_PROVIDER_SITE_OTHER): Payer: Medicare Other | Admitting: Podiatry

## 2016-06-26 DIAGNOSIS — B351 Tinea unguium: Secondary | ICD-10-CM

## 2016-06-26 DIAGNOSIS — M79676 Pain in unspecified toe(s): Secondary | ICD-10-CM | POA: Diagnosis not present

## 2016-06-27 NOTE — Progress Notes (Signed)
Patient ID: Johnny SalmGeorge [name], male   DOB: 03/01/1941, 75 y.o.   MRN: [id number]  Subjective: 75 y.o. returns the office today for painful, elongated, thickened toenails which he is unable to trim himself. Denies any redness or drainage around the nails. Denies any acute changes since last appointment and no new complaints today. Denies any systemic complaints such as fevers, chills, nausea, vomiting.   Objective: AAO 3, NAD DP/PT pulses palpable, CRT less than 3 seconds Nails hypertrophic, dystrophic, elongated, brittle, discolored 10. There is tenderness overlying the nails 1-5 bilaterally. There is no surrounding erythema or drainage along the nail sites. No open lesions or pre-ulcerative lesions are identified. No other areas of tenderness bilateral lower extremities. No overlying edema, erythema, increased warmth. No pain with calf compression, swelling, warmth, erythema. Overall, exam unchanged.   Assessment: Patient presents with symptomatic onychomycosis  Plan: -Treatment options including alternatives, risks, complications were discussed -Nails sharply debrided 10 without complication/bleeding. -Discussed daily foot inspection. If there are any changes, to call the office immediately.  -Follow-up in 3 months or sooner if any problems are to arise. In the meantime, encouraged to call the office with any questions, concerns, changes symptoms.  Ovid CurdMatthew Joell Usman, DPM

## 2016-08-18 ENCOUNTER — Encounter: Payer: Medicare Other | Admitting: Internal Medicine

## 2016-08-21 ENCOUNTER — Ambulatory Visit (INDEPENDENT_AMBULATORY_CARE_PROVIDER_SITE_OTHER): Payer: Medicare Other | Admitting: Internal Medicine

## 2016-08-21 ENCOUNTER — Encounter: Payer: Self-pay | Admitting: Internal Medicine

## 2016-08-21 VITALS — BP 126/80 | HR 66 | Temp 98.1°F | Ht 67.5 in | Wt 157.0 lb

## 2016-08-21 DIAGNOSIS — G20A1 Parkinson's disease without dyskinesia, without mention of fluctuations: Secondary | ICD-10-CM

## 2016-08-21 DIAGNOSIS — G20 Parkinson's disease: Secondary | ICD-10-CM

## 2016-08-21 DIAGNOSIS — Z0000 Encounter for general adult medical examination without abnormal findings: Secondary | ICD-10-CM | POA: Diagnosis not present

## 2016-08-21 DIAGNOSIS — I10 Essential (primary) hypertension: Secondary | ICD-10-CM | POA: Diagnosis not present

## 2016-08-21 DIAGNOSIS — Z7189 Other specified counseling: Secondary | ICD-10-CM | POA: Diagnosis not present

## 2016-08-21 DIAGNOSIS — M75102 Unspecified rotator cuff tear or rupture of left shoulder, not specified as traumatic: Secondary | ICD-10-CM | POA: Diagnosis not present

## 2016-08-21 LAB — CBC WITH DIFFERENTIAL/PLATELET
BASOS PCT: 0.3 % (ref 0.0–3.0)
Basophils Absolute: 0 10*3/uL (ref 0.0–0.1)
EOS ABS: 0.3 10*3/uL (ref 0.0–0.7)
EOS PCT: 3.8 % (ref 0.0–5.0)
HCT: 38.3 % — ABNORMAL LOW (ref 39.0–52.0)
Hemoglobin: 13.2 g/dL (ref 13.0–17.0)
LYMPHS PCT: 17.9 % (ref 12.0–46.0)
Lymphs Abs: 1.4 10*3/uL (ref 0.7–4.0)
MCHC: 34.6 g/dL (ref 30.0–36.0)
MCV: 96.2 fl (ref 78.0–100.0)
MONOS PCT: 5.8 % (ref 3.0–12.0)
Monocytes Absolute: 0.5 10*3/uL (ref 0.1–1.0)
NEUTROS ABS: 5.6 10*3/uL (ref 1.4–7.7)
Neutrophils Relative %: 72.2 % (ref 43.0–77.0)
PLATELETS: 222 10*3/uL (ref 150.0–400.0)
RBC: 3.98 Mil/uL — ABNORMAL LOW (ref 4.22–5.81)
RDW: 13.3 % (ref 11.5–15.5)
WBC: 7.8 10*3/uL (ref 4.0–10.5)

## 2016-08-21 LAB — COMPREHENSIVE METABOLIC PANEL
ALBUMIN: 4.1 g/dL (ref 3.5–5.2)
ALT: 4 U/L (ref 0–53)
AST: 14 U/L (ref 0–37)
Alkaline Phosphatase: 47 U/L (ref 39–117)
BUN: 28 mg/dL — ABNORMAL HIGH (ref 6–23)
CALCIUM: 9.2 mg/dL (ref 8.4–10.5)
CHLORIDE: 96 meq/L (ref 96–112)
CO2: 30 meq/L (ref 19–32)
Creatinine, Ser: 1.03 mg/dL (ref 0.40–1.50)
GFR: 74.72 mL/min (ref >60.00)
Glucose, Bld: 88 mg/dL (ref 70–99)
POTASSIUM: 4 meq/L (ref 3.5–5.1)
Sodium: 131 mEq/L — ABNORMAL LOW (ref 135–145)
Total Bilirubin: 0.6 mg/dL (ref 0.2–1.2)
Total Protein: 7.4 g/dL (ref 6.0–8.3)

## 2016-08-21 MED ORDER — FLUTICASONE PROPIONATE 50 MCG/ACT NA SUSP: NASAL | 11 refills | Status: DC

## 2016-08-21 MED ORDER — TRIAMCINOLONE ACETONIDE 0.1 % EX CREA: TOPICAL_CREAM | CUTANEOUS | 1 refills | Status: DC

## 2016-08-21 MED ORDER — LOSARTAN POTASSIUM 50 MG PO TABS: 50.0000 mg | ORAL_TABLET | Freq: Every day | ORAL | 3 refills | Status: DC

## 2016-08-21 NOTE — Assessment & Plan Note (Signed)
Continues to work with Dr Shanda Howellsalakos

## 2016-08-21 NOTE — Progress Notes (Signed)
Subjective:    Patient ID: Johnny Barnett, male    DOB: Mar 27, 1941, 75 y.o.   MRN: [id number]  HPI Here for Medicare wellness visit and follow up of chronic medical conditions Wife is here Reviewed the form and his advanced directives Reviewed other doctors 1-2 drinks most days--wine No tobacco Vision is changing--- plans to go back for reevaluation (trouble with reading Rx) Hearing is fine 1 fall this year-- fell getting out of car on uneven ground. 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  No staining or color No blood  Bowels are pretty good since his intestinal surgery. Goes every other day generally Usually okay as long as he drinks plenty of water  Current Outpatient Prescriptions on File Prior to Visit  Medication Sig Dispense Refill  . amLODipine (NORVASC) 5 MG tablet Take 1 tablet (5 mg total) by mouth daily. 90 tablet 2  . aspirin 81 MG EC tablet Take 81 mg by mouth daily.      . carbidopa-levodopa (SINEMET IR) 25-100 MG per tablet Take 1.5 tabs at 730, 1130 and 330. Take one tab at 630.    . Carbidopa-Levodopa ER (SINEMET CR) 25-100 MG tablet controlled release Take 2 tablets by mouth at bedtime.    .  fluticasone (FLONASE) 50 MCG/ACT nasal spray PLACE 2 SPRAYS INTO EACH NOSTRIL DAILY. 16 g 11  . losartan (COZAAR) 50 MG tablet Take 1 tablet (50 mg total) by mouth daily. 90 tablet 2  . Multiple Vitamin (MULTIVITAMIN) tablet Take 1 tablet by mouth daily.      Marland Kitchen rOPINIRole (REQUIP) 1 MG tablet Take 1 mg by mouth 3 (three) times daily.     Marland Kitchen triamcinolone cream (KENALOG) 0.1 % APPLY TWICE A DAY AS NEEDED FOR ITCHY AREAS 30 g 1   No current facility-administered medications on file prior to visit.     No Known Allergies  Past Medical History:  Diagnosis Date  . Degenerative joint disease   . ED (erectile dysfunction)   . Hyperlipidemia   . Parkinson disease [facility])     Past Surgical History:  Procedure Laterality Date  . CATARACT EXTRACTION W/ INTRAOCULAR LENS IMPLANT Bilateral   . CYST REMOVAL TRUNK  1/16   Dr Egbert Garibaldi  . FLEXIBLE SIGMOIDOSCOPY  08/05  . Sigmoid volvulus resection  10/13   Dr Michela Pitcher    Family History  Problem Relation Age of Onset  . Diabetes Paternal Uncle   . Coronary artery disease Neg Hx   . Hypertension Neg Hx     Social History   Social History  . Marital status: Married    Spouse name: N/A  . Number of children: 2  . Years of education: N/A   Occupational History  .  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 Smoking status: Never Smoker  . Smokeless tobacco: Never Used  . Alcohol use Yes     Comment: wine  . Drug use: No  . Sexual activity: Not on file   Other Topics Concern  . 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, theatre/television/filmAdministrator   Wrote University's history, published 2014      Has living will   Wife, then daughter Trudie BucklerHeidi, would be [facility] care POA   Would accept resuscitation attempts   No tube feedings if cognitively unaware            Review of Systems  Appetite is good Has lost some weight this year Sleeps well---up  twice to void Some daytime urgency-- no incontinence No rash or suspicious lesions Mild arthritis in other joints Wears seat belt Teeth okay--keeps up with dentist     Objective:   Physical Exam  Constitutional: He is oriented to person, place, and time. He appears well-developed. No distress.  HENT:  Mouth/Throat: Oropharynx is clear and moist. No oropharyngeal exudate.  Neck: Normal range of motion. Neck supple. No thyromegaly present.  Cardiovascular: Normal rate, regular rhythm, normal heart sounds and intact distal pulses.  Exam reveals no gallop.   No murmur heard. Pulmonary/Chest: Effort normal and breath sounds normal. No respiratory distress. He has no wheezes. He has no rales.  Abdominal: Soft. There is no tenderness.  Musculoskeletal: He exhibits no edema or tenderness.  Fair passive ROM of left shoulder but lots of crepitus (just limited abduction at 90 degrees)  Lymphadenopathy:    He has no cervical adenopathy.  Neurological: He is alert and oriented to person, place, and time.  President-- "Garnet Koyanagionald Trump, Thomasene MohairBarack Obama, Jonah Bush" [id number] Recall 3/3  Mild to moderate bradykinesia  Skin: No rash noted. No erythema.  Psychiatric: He has a normal mood and affect. His behavior is normal.          Assessment & Plan:

## 2016-08-21 NOTE — Progress Notes (Signed)
Pre visit review using our clinic review tool, if applicable. No additional management support is needed unless otherwise documented below in the visit note. 

## 2016-08-21 NOTE — Assessment & Plan Note (Signed)
BP Readings from Last 3 Encounters:  08/21/16 126/80  08/15/15 120/60  06/18/15 106/62   Has documented systolic BP under 110 at home and orthostatic symptoms Will try off the amlodipine

## 2016-08-21 NOTE — Assessment & Plan Note (Signed)
I have personally reviewed the Medicare Annual Wellness questionnaire and have noted 1. The patient's medical and social history 2. Their use of alcohol, tobacco or illicit drugs 3. Their current medications and supplements 4. The patient's functional ability including ADL's, fall risks, home safety risks and hearing or visual             impairment. 5. Diet and physical activities 6. Evidence for depression or mood disorders  The patients weight, height, BMI and visual acuity have been recorded in the chart I have made referrals, counseling and provided education to the patient based review of the above and I have provided the pt with a written personalized care plan for preventive services.  I have provided you with a copy of your personalized plan for preventive services. Please take the time to review along with your updated medication list.  No cancer screening due to age Rectal mucus not really treatable Tries to exercise Had flu vaccine

## 2016-08-21 NOTE — Patient Instructions (Signed)
Stop the amlodipine

## 2016-08-21 NOTE — Assessment & Plan Note (Signed)
See social history 

## 2016-08-21 NOTE — Assessment & Plan Note (Signed)
Some healing but still limited Undecided about surgery (but may not be a good idea for him due to expected post op disability)

## 2016-09-08 ENCOUNTER — Encounter: Payer: Self-pay | Admitting: Internal Medicine

## 2016-09-09 ENCOUNTER — Encounter: Payer: Self-pay | Admitting: Internal Medicine

## 2016-09-09 DIAGNOSIS — R1312 Dysphagia, oropharyngeal phase: Secondary | ICD-10-CM

## 2016-09-11 ENCOUNTER — Other Ambulatory Visit: Payer: Self-pay | Admitting: Internal Medicine

## 2016-09-11 DIAGNOSIS — R1312 Dysphagia, oropharyngeal phase: Secondary | ICD-10-CM

## 2016-09-11 NOTE — Progress Notes (Signed)
Please let him know I ordered the special swallowing test before the speech therapist can start her evaluation

## 2016-09-11 NOTE — Telephone Encounter (Signed)
Appropriate for him to have speech therapy. Will make the referral

## 2016-09-15 ENCOUNTER — Other Ambulatory Visit: Payer: Self-pay | Admitting: Internal Medicine

## 2016-09-15 DIAGNOSIS — R1312 Dysphagia, oropharyngeal phase: Secondary | ICD-10-CM

## 2016-09-26 ENCOUNTER — Ambulatory Visit: Payer: Medicare Other | Admitting: Podiatry

## 2016-10-06 ENCOUNTER — Ambulatory Visit: Admission: RE | Admit: 2016-10-06 | Payer: Medicare Other | Source: Ambulatory Visit

## 2016-10-09 ENCOUNTER — Encounter: Payer: Self-pay | Admitting: Podiatry

## 2016-10-09 ENCOUNTER — Ambulatory Visit (INDEPENDENT_AMBULATORY_CARE_PROVIDER_SITE_OTHER): Payer: Medicare Other | Admitting: Podiatry

## 2016-10-09 DIAGNOSIS — B351 Tinea unguium: Secondary | ICD-10-CM | POA: Diagnosis not present

## 2016-10-09 DIAGNOSIS — M79609 Pain in unspecified limb: Secondary | ICD-10-CM

## 2016-10-09 DIAGNOSIS — L608 Other nail disorders: Secondary | ICD-10-CM

## 2016-10-09 DIAGNOSIS — L603 Nail dystrophy: Secondary | ICD-10-CM

## 2016-10-12 NOTE — Progress Notes (Signed)

## 2016-10-14 ENCOUNTER — Ambulatory Visit: Payer: Medicare Other | Admitting: Speech Pathology

## 2016-10-15 ENCOUNTER — Ambulatory Visit: Payer: Medicare Other | Attending: Internal Medicine | Admitting: Speech Pathology

## 2016-10-15 ENCOUNTER — Encounter: Payer: Self-pay | Admitting: Speech Pathology

## 2016-10-15 DIAGNOSIS — R1312 Dysphagia, oropharyngeal phase: Secondary | ICD-10-CM | POA: Diagnosis not present

## 2016-10-15 NOTE — Therapy (Signed)
[facility] [address] [place], [place], [address] Phone: [phone]   Fax:  [phone]  Speech Language Pathology Evaluation  Patient Details  Name: Johnny SalmGeorge W Blackerby MRN: [id number] Date of Birth: 06/02/1941 Referring Provider: Dr. Alphonsus SiasLetvak  Encounter Date: 10/15/2016      End of Session - 10/15/16 1633    Visit Number 1   Number of Visits 9   Date for SLP Re-Evaluation 01/07/17   SLP Start Time 1400   SLP Stop Time  1500   SLP Time Calculation (min) 60 min   Activity Tolerance Patient tolerated treatment well      Past Medical History:  Diagnosis Date  . Degenerative joint disease   . ED (erectile dysfunction)   . Hyperlipidemia   . Parkinson disease [facility](HCC)     Past Surgical History:  Procedure Laterality Date  . CATARACT EXTRACTION W/ INTRAOCULAR LENS IMPLANT Bilateral   . CYST REMOVAL TRUNK  1/16   Dr Egbert GaribaldiBird  . FLEXIBLE SIGMOIDOSCOPY  08/05  . Sigmoid volvulus resection  10/13   Dr Michela PitcherEly    There were no vitals filed for this visit.          SLP Evaluation [facility] - 10/15/16 0001      SLP Visit Information   SLP Received On 10/15/16   Referring Provider Dr. Alphonsus SiasLetvak   Onset Date 09/04/2016   Medical Diagnosis Oropharyngeal dysphagia     Subjective   Subjective Patient aware that Parkinsons' can result in dysphagia   Patient/Family Stated Goal Strengthen swallowing     General Information   HPI 75 year old man with Parkinson's disease.  The patient had a [facility]/2016 showing silent aspiration of thin liquids before and during the swallow, trace laryngeal penetration of nectar-thick liquids before and during the swallow, and audible aspiration of food during the swallow.  Recommendations per that study included diet modification (well chewed chopped foods with nectar-thick liquid) and swallowing exercise program.  The patient [facility].     Prior Functional Status   Cognitive/Linguistic Baseline Within functional limits     Oral Motor/Sensory Function   Overall Oral Motor/Sensory Function Impaired     Motor Speech   Overall Motor Speech Impaired     Standardized Assessments   Standardized Assessments  Other Assessment  Outside MBSS      Education: Reviewed the results and recommendations of the MBSS of 09/04/2016.  Reviewed potential negative sequelae to chronic aspiration.  Reviewed thickened liquids and what constitutes thin liquid.  Reviewed free water protocol.  Patient provided with written and verbal teaching of exercise program.  Swallowing Exercise Program:  The patient was trained in Shaker Exercise, Effortful Swallow, Berkshire HathawayShowa Maneuver, Vowel Prolongation, Effortful Pitch Glide, and loud reading.  The patient demonstrates competent performance.  He was given written instructions and a swallow exercise log along with recommended frequency and number of repetitions.         SLP Education - 10/15/16 1632    Education provided Yes   Education Details HEP   Person(s) Educated Patient   Methods Explanation;Handout   Comprehension Verbalized understanding            SLP Long Term Goals - 10/15/16 1637      SLP LONG TERM GOAL #1   Title Patient will independently perform selected swallowing exercises given written instructions.   Time 12   Period  Weeks   Status New     SLP LONG TERM GOAL #2   Title Patient will demonstrate independent understanding and implementation of swallowing strategies.   Time 12   Period Weeks   Status New          Plan - 10/15/16 1635    Clinical Impression Statement This 75 year old man with Parkinson's disease is presenting moderate-severe oropharyngeal dysphagia. The patient had a [facility] swallow [facility]04/2016 showing silent aspiration of thin liquids before and during the swallow, trace laryngeal penetration of nectar-thick liquids before and  during the swallow, and audible aspiration of food during the swallow.  Recommendations per that study included diet modification (well chewed chopped foods with nectar-thick liquid) and swallowing exercise program.  The patient has elected to have [facility]. The patient was provided with education, including: Reviewed the results and recommendations of the MBSS of 09/04/2016.  Reviewed potential negative sequelae to chronic aspiration.  Reviewed thickened liquids and what constitutes thin liquid.  Reviewed free water protocol.  The patient was trained in [employer] CorporationShaker Exercise, Effortful Swallow, Berkshire HathawayShowa Maneuver, Vowel Prolongation, Effortful Pitch Glide, and loud reading.  The patient demonstrates competent performance.  He was given written instructions and a swallow exercise log along with recommended frequency and number of repetitions.      Speech Therapy Frequency 1x /week   Duration Other (comment)  12 weeks   Treatment/Interventions Aspiration precaution training;Pharyngeal strengthening exercises;Patient/family education   Potential to Achieve Goals Good   Potential Considerations Ability to learn/carryover information;Co-morbidities;Cooperation/participation level;Medical prognosis;Previous level of function;Severity of impairments;Family/community support   SLP Home Exercise Plan Swallowing exercises   Consulted and Agree with Plan of Care Patient      Patient will benefit from skilled therapeutic intervention in order to improve the following deficits and impairments:   Dysphagia, oropharyngeal phase - Plan: SLP plan of care cert/re-cert      G-Codes - 10/15/16 1638    Functional Assessment Tool Used MBSS, clincial judgment   Functional Limitations Swallowing   Swallow Current Status (Z6109(G8996) At least 60 percent but less than 80 percent impaired, limited or restricted   Swallow Goal Status (U0454(G8997) At least 20 percent but less than 40 percent impaired, limited or restricted       Problem List Patient Active Problem List   Diagnosis Date Noted  . Advance directive discussed with patient 08/15/2015  . Left rotator cuff tear 08/15/2015  . Actinic keratosis 12/02/2012  . Routine general medical examination at a health care facility 07/23/2012  . Other constipation 09/09/2011  . Parkinson disease (HCC) 07/07/2011  . HTN (hypertension) 05/30/2011  . Memory loss 10/16/2010  . HYPERLIPIDEMIA 05/17/2007  . ERECTILE DYSFUNCTION 05/17/2007  . DEGENERATIVE JOINT DISEASE 05/17/2007   Dollene PrimroseSusan G Kodi Guerrera, MS/CCC- SLP  Leandrew KoyanagiAbernathy, Susie 10/15/2016, 4:41 PM  [facility] [address] [place], [place], [address] Phone: [phone]   Fax:  [phone]  Name: Johnny SalmGeorge W Giron MRN: [id number] Date of Birth: 05/01/1941

## 2016-10-16 ENCOUNTER — Ambulatory Visit: Payer: Medicare Other | Admitting: Speech Pathology

## 2016-10-20 ENCOUNTER — Ambulatory Visit: Payer: Medicare Other | Admitting: Speech Pathology

## 2016-10-20 ENCOUNTER — Encounter: Payer: Self-pay | Admitting: Speech Pathology

## 2016-10-20 DIAGNOSIS — R1312 Dysphagia, oropharyngeal phase: Secondary | ICD-10-CM

## 2016-10-20 NOTE — Therapy (Signed)
[facility] [address] [place], [place], [address] Phone: [phone]   Fax:  [phone]  Speech Language Pathology Treatment  Patient Details  Name: Johnny Barnett MRN: [id number] Date of Birth: 09/03/1941 Referring Provider: Dr. Alphonsus SiasLetvak  Encounter Date: 10/20/2016      End of Session - 10/20/16 1621    Visit Number 2   Number of Visits 9   Date for SLP Re-Evaluation 01/07/17   SLP Start Time 1410   SLP Stop Time  1500   SLP Time Calculation (min) 50 min      Past Medical History:  Diagnosis Date  . Degenerative joint disease   . ED (erectile dysfunction)   . Hyperlipidemia   . Parkinson disease [facility](HCC)     Past Surgical History:  Procedure Laterality Date  . CATARACT EXTRACTION W/ INTRAOCULAR LENS IMPLANT Bilateral   . CYST REMOVAL TRUNK  1/16   Dr Egbert GaribaldiBird  . FLEXIBLE SIGMOIDOSCOPY  08/05  . Sigmoid volvulus resection  10/13   Dr Michela PitcherEly    There were no vitals filed for this visit.      Subjective Assessment - 10/20/16 1622    Subjective "It take so much effort"               ADULT SLP TREATMENT - 10/20/16 0001      General Information   Behavior/Cognition Alert;Cooperative;Pleasant mood     Treatment Provided   Treatment provided Dysphagia     Pain Assessment   Pain Assessment No/denies pain     Cognitive-Linquistic Treatment   Treatment focused on Other (comment)  Dysphagia   Skilled Treatment The patient is doing the recommended exercises accurately, but at reduced frequency.  He states fatigue as primary reason for reduced frequency.  The patient was trained for the modified Shaker and we have changed his program to 5 repetitions (vs. 10) to improve stamina for completing all the recommended exercises.  The patient reports increased use of nectar-thick liquids and decreased amount of thin liquid, but is reluctant to completely discontinue thin liquid.  We discussed potential negative  sequelae to aspiration and that the observed aspiration during his swallow study was silent.     Assessment / Recommendations / Plan   Plan Continue with current plan of care     Progression Toward Goals   Progression toward goals Progressing toward goals          SLP Education - 10/20/16 1621    Education provided Yes   Education Details HEP   Person(s) Educated Patient   Methods Explanation   Comprehension Verbalized understanding            SLP Long Term Goals - 10/15/16 1637      SLP LONG TERM GOAL #1   Title Patient will independently perform selected swallowing exercises given written instructions.   Time 12   Period Weeks   Status New     SLP LONG TERM GOAL #2   Title Patient will demonstrate independent understanding and implementation of swallowing strategies.   Time 12   Period Weeks   Status New          Plan - 10/20/16 1621    Clinical Impression Statement The patient is doing his exercises accurately and his voice sounds stronger.  He reports fatigue as an interfering barrier to completing the exercises as frequently as recommended.  His program has been modified to accommodate this.     Speech  Therapy Frequency 1x /week   Duration Other (comment)  12 weeks   Treatment/Interventions Aspiration precaution training;Pharyngeal strengthening exercises;Patient/family education   Potential to Achieve Goals Good   Potential Considerations Ability to learn/carryover information;Co-morbidities;Cooperation/participation level;Medical prognosis;Previous level of function;Severity of impairments;Family/community support   SLP Home Exercise Plan Swallowing exercises   Consulted and Agree with Plan of Care Patient      Patient will benefit from skilled therapeutic intervention in order to improve the following deficits and impairments:   Dysphagia, oropharyngeal phase    Problem List Patient Active Problem List   Diagnosis Date Noted  . Advance directive  discussed with patient 08/15/2015  . Left rotator cuff tear 08/15/2015  . Actinic keratosis 12/02/2012  . Routine general medical examination at a health care facility 07/23/2012  . Other constipation 09/09/2011  . Parkinson disease (HCC) 07/07/2011  . HTN (hypertension) 05/30/2011  . Memory loss 10/16/2010  . HYPERLIPIDEMIA 05/17/2007  . ERECTILE DYSFUNCTION 05/17/2007  . DEGENERATIVE JOINT DISEASE 05/17/2007   Dollene PrimroseSusan G Myrtie Leuthold, MS/CCC- SLP  Leandrew KoyanagiAbernathy, Susie 10/20/2016, 4:24 PM  [facility] [address] [place], [place], [address] Phone: [phone]   Fax:  [phone]   Name: Johnny Barnett MRN: [id number] Date of Birth: 01/28/1941

## 2016-10-23 ENCOUNTER — Ambulatory Visit: Payer: Medicare Other | Admitting: Speech Pathology

## 2016-10-27 ENCOUNTER — Ambulatory Visit: Payer: Medicare Other | Admitting: Speech Pathology

## 2016-10-27 ENCOUNTER — Encounter: Payer: Self-pay | Admitting: Speech Pathology

## 2016-10-27 DIAGNOSIS — R1312 Dysphagia, oropharyngeal phase: Secondary | ICD-10-CM | POA: Diagnosis not present

## 2016-10-27 NOTE — Therapy (Signed)
[place] [facility] [address] [place], [place], [address] Phone: [phone]   Fax:  [phone]  Speech Language Pathology Treatment  Patient Details  Name: Johnny SalmGeorge [name] MRN: [id number] Date of Birth: 07/27/1941 Referring Provider: Dr. Alphonsus SiasLetvak  Encounter Date: 10/27/2016      End of Session - 10/27/16 1652    Visit Number 3   Number of Visits 9   Date for SLP Re-Evaluation 01/07/17   SLP Start Time 1615   SLP Stop Time  1650   SLP Time Calculation (min) 35 min      Past Medical History:  Diagnosis Date  . Degenerative joint disease   . ED (erectile dysfunction)   . Hyperlipidemia   . Parkinson disease [facility](HCC)     Past Surgical History:  Procedure Laterality Date  . CATARACT EXTRACTION W/ INTRAOCULAR LENS IMPLANT Bilateral   . CYST REMOVAL TRUNK  1/16   Dr Egbert GaribaldiBird  . FLEXIBLE SIGMOIDOSCOPY  08/05  . Sigmoid volvulus resection  10/13   Dr Michela PitcherEly    There were no vitals filed for this visit.      Subjective Assessment - 10/27/16 1651    Subjective "It take so much effort"               ADULT SLP TREATMENT - 10/27/16 0001      General Information   Behavior/Cognition Alert;Cooperative;Pleasant mood     Treatment Provided   Treatment provided Dysphagia     Pain Assessment   Pain Assessment No/denies pain     Cognitive-Linquistic Treatment   Treatment focused on Other (comment)  Dysphagia   Skilled Treatment The patient is doing the recommended exercises accurately, but at reduced frequency.  Last week the patient was trained for the modified Shaker and we have changed his program to 5 repetitions (vs. 10) to improve stamina for completing all the recommended exercises.  He reports that this has helped him to complete all the recommended exercises when he exercises.  He is completing the routine 1 to 3 times per day.  The patient reports increased use of nectar-thick liquids and decreased amount of thin  liquid, but is reluctant to completely discontinue thin liquid.  We discussed potential negative sequelae to aspiration and that the observed aspiration during his swallow study was silent.     Assessment / Recommendations / Plan   Plan Continue with current plan of care     Progression Toward Goals   Progression toward goals Progressing toward goals          SLP Education - 10/27/16 1652    Education provided Yes   Education Details Rationale for effortful pitch glide   Person(s) Educated Patient   Methods Explanation   Comprehension Verbalized understanding            SLP Long Term Goals - 10/15/16 1637      SLP LONG TERM GOAL #1   Title Patient will independently perform selected swallowing exercises given written instructions.   Time 12   Period Weeks   Status New     SLP LONG TERM GOAL #2   Title Patient will demonstrate independent understanding and implementation of swallowing strategies.   Time 12   Period Weeks   Status New          Plan - 10/27/16 1652    Clinical Impression Statement The patient is doing his exercises accurately and his voice sounds stronger.  He reports fatigue as  an interfering barrier to completing the exercises as frequently as recommended.  His program was modified to accommodate this and he reports better compliance.     Speech Therapy Frequency 1x /week   Duration Other (comment)   Treatment/Interventions Aspiration precaution training;Pharyngeal strengthening exercises;Patient/family education   Potential to Achieve Goals Good   Potential Considerations Ability to learn/carryover information;Co-morbidities;Cooperation/participation level;Medical prognosis;Previous level of function;Severity of impairments;Family/community support   SLP Home Exercise Plan Swallowing exercises   Consulted and Agree with Plan of Care Patient      Patient will benefit from skilled therapeutic intervention in order to improve the following deficits  and impairments:   Dysphagia, oropharyngeal phase    Problem List Patient Active Problem List   Diagnosis Date Noted  . Advance directive discussed with patient 08/15/2015  . Left rotator cuff tear 08/15/2015  . Actinic keratosis 12/02/2012  . Routine general medical examination at a health care facility 07/23/2012  . Other constipation 09/09/2011  . Parkinson disease (HCC) 07/07/2011  . HTN (hypertension) 05/30/2011  . Memory loss 10/16/2010  . HYPERLIPIDEMIA 05/17/2007  . ERECTILE DYSFUNCTION 05/17/2007  . DEGENERATIVE JOINT DISEASE 05/17/2007   Dollene PrimroseSusan G Chosen Garron, MS/CCC- SLP  Leandrew KoyanagiAbernathy, Susie 10/27/2016, 4:54 PM  [place] [facility] MAIN [facility] [address] [place], [place], [address] Phone: [phone]   Fax:  [phone]   Name: Johnny SalmGeorge [name] MRN: [id number] Date of Birth: 01/05/1941

## 2016-10-31 ENCOUNTER — Ambulatory Visit: Payer: Medicare Other | Admitting: Speech Pathology

## 2016-11-05 ENCOUNTER — Ambulatory Visit: Payer: Medicare Other | Admitting: Speech Pathology

## 2016-11-05 ENCOUNTER — Encounter: Payer: Self-pay | Admitting: Speech Pathology

## 2016-11-05 DIAGNOSIS — R1312 Dysphagia, oropharyngeal phase: Secondary | ICD-10-CM | POA: Diagnosis not present

## 2016-11-05 NOTE — Therapy (Signed)
[place] [address] [place], [place], [address] Phone: [phone]   Fax:  [phone]  Speech Language Pathology Treatment  Patient Details  Name: Johnny Barnett MRN: [id number] Date of Birth: 03/11/1941 Referring Provider: Dr. Alphonsus SiasLetvak  Encounter Date: 11/05/2016      End of Session - 11/05/16 1500    Visit Number 4   Number of Visits 9   Date for SLP Re-Evaluation 01/07/17   SLP Start Time 1400   SLP Stop Time  1455   SLP Time Calculation (min) 55 min      Past Medical History:  Diagnosis Date  . Degenerative joint disease   . ED (erectile dysfunction)   . Hyperlipidemia   . Parkinson disease [facility](HCC)     Past Surgical History:  Procedure Laterality Date  . CATARACT EXTRACTION W/ INTRAOCULAR LENS IMPLANT Bilateral   . CYST REMOVAL TRUNK  1/16   Dr Egbert GaribaldiBird  . FLEXIBLE SIGMOIDOSCOPY  08/05  . Sigmoid volvulus resection  10/13   Dr Michela PitcherEly    There were no vitals filed for this visit.      Subjective Assessment - 11/05/16 1457    Subjective "It take so much effort"   Currently in Pain? No/denies               ADULT SLP TREATMENT - 11/05/16 0001      General Information   Behavior/Cognition Alert;Cooperative;Pleasant mood     Treatment Provided   Treatment provided Cognitive-Linquistic     Pain Assessment   Pain Assessment No/denies pain     Cognitive-Linquistic Treatment   Treatment focused on Other (comment)  Dysphagia   Skilled Treatment The patient is doing the recommended exercises accurately and with improved frequency.(although last week he missed 2 days due to holiday and family gatherings).  The patient has been completing his exercises with 5 repetitions, today it is upped to 10.  He is completing the routine 1 to 3 times per day.  The patient reports increased use of nectar-thick liquids and decreased amount of thin liquid, but is reluctant to completely discontinue thin liquid.  We  discussed potential negative sequelae to aspiration and that the observed aspiration during his swallow study was silent.  Patient reports several items that cause specific difficulty (coughing) and we discussed modifications to try.     Assessment / Recommendations / Plan   Plan Continue with current plan of care     Progression Toward Goals   Progression toward goals Progressing toward goals          SLP Education - 11/05/16 1457    Education provided Yes   Education Details Rationale for progressive strengthening   Person(s) Educated Patient   Methods Explanation   Comprehension Verbalized understanding            SLP Long Term Goals - 10/15/16 1637      SLP LONG TERM GOAL #1   Title Patient will independently perform selected swallowing exercises given written instructions.   Time 12   Period Weeks   Status New     SLP LONG TERM GOAL #2   Title Patient will demonstrate independent understanding and implementation of swallowing strategies.   Time 12   Period Weeks   Status New          Plan - 11/05/16 1501    Clinical Impression Statement The patient is doing his exercises accurately and his voice sounds stronger.  He reports  fatigue as an interfering barrier to completing the exercises as frequently as recommended.  His program was modified to accommodate this.  Today he was able to complete all 6 exercises with 10 repetitions each.     Speech Therapy Frequency 1x /week   Duration Other (comment)   Treatment/Interventions Aspiration precaution training;Pharyngeal strengthening exercises;Patient/family education   Potential to Achieve Goals Good   Potential Considerations Ability to learn/carryover information;Co-morbidities;Cooperation/participation level;Medical prognosis;Previous level of function;Severity of impairments;Family/community support   SLP Home Exercise Plan Swallowing exercises   Consulted and Agree with Plan of Care Patient      Patient will  benefit from skilled therapeutic intervention in order to improve the following deficits and impairments:   Dysphagia, oropharyngeal phase    Problem List Patient Active Problem List   Diagnosis Date Noted  . Advance directive discussed with patient 08/15/2015  . Left rotator cuff tear 08/15/2015  . Actinic keratosis 12/02/2012  . Routine general medical examination at a health care facility 07/23/2012  . Other constipation 09/09/2011  . Parkinson disease (HCC) 07/07/2011  . HTN (hypertension) 05/30/2011  . Memory loss 10/16/2010  . HYPERLIPIDEMIA 05/17/2007  . ERECTILE DYSFUNCTION 05/17/2007  . DEGENERATIVE JOINT DISEASE 05/17/2007   Dollene PrimroseSusan G Deakon Frix, MS/CCC- SLP  Leandrew KoyanagiAbernathy, Susie 11/05/2016, 3:01 PM  [place] [facility] [address] [place], [place], [address] Phone: [phone]   Fax:  [phone]   Name: Johnny Barnett MRN: [id number] Date of Birth: 01/14/1941

## 2016-11-12 ENCOUNTER — Ambulatory Visit: Payer: Medicare Other | Attending: Internal Medicine | Admitting: Speech Pathology

## 2016-11-12 DIAGNOSIS — R1312 Dysphagia, oropharyngeal phase: Secondary | ICD-10-CM

## 2016-11-13 ENCOUNTER — Encounter: Payer: Self-pay | Admitting: Speech Pathology

## 2016-11-13 NOTE — Therapy (Signed)
[place] [facility] [address] [place], [place], [address] Phone: [phone]   Fax:  [phone]  Speech Language Pathology Treatment  Patient Details  Name: Johnny Barnett MRN: [id number] Date of Birth: 04/16/1941 Referring Provider: Dr. Alphonsus SiasLetvak  Encounter Date: 11/12/2016      End of Session - 11/13/16 0934    Visit Number 5   Number of Visits 9   Date for SLP Re-Evaluation 01/07/17      Past Medical History:  Diagnosis Date  . Degenerative joint disease   . ED (erectile dysfunction)   . Hyperlipidemia   . Parkinson disease [facility](HCC)     Past Surgical History:  Procedure Laterality Date  . CATARACT EXTRACTION W/ INTRAOCULAR LENS IMPLANT Bilateral   . CYST REMOVAL TRUNK  1/16   Dr Egbert GaribaldiBird  . FLEXIBLE SIGMOIDOSCOPY  08/05  . Sigmoid volvulus resection  10/13   Dr Michela PitcherEly    There were no vitals filed for this visit.      Subjective Assessment - 11/13/16 0934    Subjective "It take so much effort"   Currently in Pain? No/denies               ADULT SLP TREATMENT - 11/13/16 0001      General Information   Behavior/Cognition Alert;Cooperative;Pleasant mood     Treatment Provided   Treatment provided Cognitive-Linquistic     Pain Assessment   Pain Assessment No/denies pain     Cognitive-Linquistic Treatment   Treatment focused on Other (comment)  Dysphagia   Skilled Treatment The patient is doing the recommended exercises accurately and with improved frequency as well as increased number of repetitions per exercise.  He is completing the routine 1 to 3 times per day.  The patient reports increased use of nectar-thick liquids and decreased amount of thin liquid, but is reluctant to completely discontinue thin liquid.  We discussed potential negative sequelae to aspiration and that the observed aspiration during his swallow study was silent.  Patient reports several items that cause specific difficulty (coughing)  and we discussed modifications to try.     Assessment / Recommendations / Plan   Plan Continue with current plan of care     Progression Toward Goals   Progression toward goals Progressing toward goals          SLP Education - 11/13/16 0934    Education provided Yes   Education Details Progressive strengthening will involve working to fatigue   Person(s) Educated Patient   Methods Explanation   Comprehension Verbalized understanding            SLP Long Term Goals - 10/15/16 1637      SLP LONG TERM GOAL #1   Title Patient will independently perform selected swallowing exercises given written instructions.   Time 12   Period Weeks   Status New     SLP LONG TERM GOAL #2   Title Patient will demonstrate independent understanding and implementation of swallowing strategies.   Time 12   Period Weeks   Status New          Plan - 11/13/16 0934    Clinical Impression Statement The patient is doing his exercises accurately and his voice sounds stronger.  He reports fatigue as an interfering barrier to completing the exercises as frequently as recommended.  His program was modified to accommodate this.  Today he was able to complete all 6 exercises with 10 repetitions each.  Speech Therapy Frequency 1x /week   Duration Other (comment)   Treatment/Interventions Aspiration precaution training;Pharyngeal strengthening exercises;Patient/family education   Potential to Achieve Goals Good   Potential Considerations Ability to learn/carryover information;Co-morbidities;Cooperation/participation level;Medical prognosis;Previous level of function;Severity of impairments;Family/community support   SLP Home Exercise Plan Swallowing exercises   Consulted and Agree with Plan of Care Patient      Patient will benefit from skilled therapeutic intervention in order to improve the following deficits and impairments:   Dysphagia, oropharyngeal phase    Problem List Patient Active  Problem List   Diagnosis Date Noted  . Advance directive discussed with patient 08/15/2015  . Left rotator cuff tear 08/15/2015  . Actinic keratosis 12/02/2012  . Routine general medical examination at a health care facility 07/23/2012  . Other constipation 09/09/2011  . Parkinson disease (HCC) 07/07/2011  . HTN (hypertension) 05/30/2011  . Memory loss 10/16/2010  . HYPERLIPIDEMIA 05/17/2007  . ERECTILE DYSFUNCTION 05/17/2007  . DEGENERATIVE JOINT DISEASE 05/17/2007   Dollene Primrose, MS/CCC- SLP  Leandrew Koyanagi 11/13/2016, 9:35 AM  [place] [facility] MAIN [facility] SERVICES [address] [place], [place], [address] Phone: [phone]   Fax:  [phone]   Name: Johnny Barnett MRN: [id number] Date of Birth: [birth year]

## 2016-11-14 ENCOUNTER — Ambulatory Visit: Payer: Medicare Other | Admitting: Speech Pathology

## 2016-11-17 ENCOUNTER — Ambulatory Visit: Payer: Medicare Other | Admitting: Speech Pathology

## 2016-11-19 ENCOUNTER — Ambulatory Visit: Payer: Medicare Other | Admitting: Speech Pathology

## 2016-11-19 ENCOUNTER — Encounter: Payer: Self-pay | Admitting: Speech Pathology

## 2016-11-19 DIAGNOSIS — R1312 Dysphagia, oropharyngeal phase: Secondary | ICD-10-CM | POA: Diagnosis not present

## 2016-11-19 NOTE — Therapy (Signed)
[place] [facility] MAIN [facility] SERVICES [address] [place], [place], [address] Phone: [phone]   Fax:  [phone]  Speech Language Pathology Treatment  Patient Details  Name: Johnny Barnett MRN: [id number] Date of Birth: 22-Oct-1941 Referring Provider: Dr. Alphonsus Sias  Encounter Date: 11/19/2016      End of Session - 11/19/16 1656    Visit Number 6   Number of Visits 9   Date for SLP Re-Evaluation 01/07/17   SLP Start Time 1610   SLP Stop Time  1650   SLP Time Calculation (min) 40 min   Activity Tolerance Patient tolerated treatment well      Past Medical History:  Diagnosis Date  . Degenerative joint disease   . ED (erectile dysfunction)   . Hyperlipidemia   . Parkinson disease [facility])     Past Surgical History:  Procedure Laterality Date  . CATARACT EXTRACTION W/ INTRAOCULAR LENS IMPLANT Bilateral   . CYST REMOVAL TRUNK  1/16   Dr Egbert Garibaldi  . FLEXIBLE SIGMOIDOSCOPY  08/05  . Sigmoid volvulus resection  10/13   Dr Michela Pitcher    There were no vitals filed for this visit.      Subjective Assessment - 11/19/16 1654    Subjective "It take so much effort"   Currently in Pain? No/denies               ADULT SLP TREATMENT - 11/19/16 0001      General Information   Behavior/Cognition Alert;Cooperative;Pleasant mood     Treatment Provided   Treatment provided Dysphagia     Pain Assessment   Pain Assessment No/denies pain     Cognitive-Linquistic Treatment   Treatment focused on Other (comment)  Dysphagia   Skilled Treatment The patient is doing the recommended exercises accurately and with improved frequency as well as increased number of repetitions per exercise.  He is completing the routine 2 to 3 times per day.  The patient demonstrates decreased coughing while performing his exercises.  He patient reports increased use of nectar-thick liquids and decreased amount of thin liquid, but is reluctant to completely discontinue thin  liquid.  We discussed potential negative sequelae to aspiration and that the observed aspiration during his swallow study was silent.  Patient reports several items that cause specific difficulty (coughing) and we discussed modifications to try.     Assessment / Recommendations / Plan   Plan Continue with current plan of care     Progression Toward Goals   Progression toward goals Progressing toward goals          SLP Education - 11/19/16 1655    Education provided Yes   Education Details Progressive strengthening will involve working to fatigue.   Person(s) Educated Patient   Methods Explanation   Comprehension Verbalized understanding            SLP Long Term Goals - 10/15/16 1637      SLP LONG TERM GOAL #1   Title Patient will independently perform selected swallowing exercises given written instructions.   Time 12   Period Weeks   Status New     SLP LONG TERM GOAL #2   Title Patient will demonstrate independent understanding and implementation of swallowing strategies.   Time 12   Period Weeks   Status New          Plan - 11/19/16 1656    Clinical Impression Statement The patient is doing his exercises accurately and his voice sounds stronger.  He  reports fatigue as an interfering barrier to completing the exercises as frequently as recommended.  His program was modified to accommodate this.  Today he was able to complete all 6 exercises with 10 repetitions each.  He is observed to cough less while doing his exercises, indicating improving strength of swallowing.     Speech Therapy Frequency 1x /week   Duration Other (comment)   Treatment/Interventions Aspiration precaution training;Pharyngeal strengthening exercises;Patient/family education   Potential to Achieve Goals Good   Potential Considerations Ability to learn/carryover information;Co-morbidities;Cooperation/participation level;Medical prognosis;Previous level of function;Severity of  impairments;Family/community support   SLP Home Exercise Plan Swallowing exercises   Consulted and Agree with Plan of Care Patient      Patient will benefit from skilled therapeutic intervention in order to improve the following deficits and impairments:   Dysphagia, oropharyngeal phase    Problem List Patient Active Problem List   Diagnosis Date Noted  . Advance directive discussed with patient 08/15/2015  . Left rotator cuff tear 08/15/2015  . Actinic keratosis 12/02/2012  . Routine general medical examination at a health care facility 07/23/2012  . Other constipation 09/09/2011  . Parkinson disease (HCC) 07/07/2011  . HTN (hypertension) 05/30/2011  . Memory loss 10/16/2010  . HYPERLIPIDEMIA 05/17/2007  . ERECTILE DYSFUNCTION 05/17/2007  . DEGENERATIVE JOINT DISEASE 05/17/2007   Dollene PrimroseSusan G Taziah Difatta, MS/CCC- SLP  Leandrew KoyanagiAbernathy, Susie 11/19/2016, 4:59 PM  [place] [facility] MAIN [facility] [address] [place], [place], [address] Phone: [phone]   Fax:  [phone]   Name: Johnny SalmGeorge [name] MRN: [id number] Date of Birth: 07/07/1941

## 2016-11-25 ENCOUNTER — Ambulatory Visit: Payer: Medicare Other | Admitting: Speech Pathology

## 2016-11-27 ENCOUNTER — Ambulatory Visit: Payer: Medicare Other | Admitting: Speech Pathology

## 2016-12-02 ENCOUNTER — Ambulatory Visit: Payer: Medicare Other | Admitting: Speech Pathology

## 2016-12-04 ENCOUNTER — Ambulatory Visit: Payer: Medicare Other | Admitting: Speech Pathology

## 2016-12-17 ENCOUNTER — Encounter: Payer: Self-pay | Admitting: Speech Pathology

## 2016-12-17 ENCOUNTER — Ambulatory Visit: Payer: Medicare Other | Attending: Internal Medicine | Admitting: Speech Pathology

## 2016-12-17 DIAGNOSIS — R1312 Dysphagia, oropharyngeal phase: Secondary | ICD-10-CM

## 2016-12-17 NOTE — Therapy (Signed)
[place] MAIN [facility] SERVICES [address] [place], [place], [address] Phone: [phone]   Fax:  [phone]  Speech Language Pathology Treatment/Progress Report  Patient Details  Name: G[place] T[place] WENZLICK MRN: [id number] Date of Birth: 12-24-1940 Referring Provider: Dr. Silvio Pate  Encounter Date: 12/17/2016      End of Session - 12/17/16 1156    Visit Number 7   Number of Visits 9   Date for SLP Re-Evaluation 01/07/17   SLP Start Time 1105   SLP Stop Time  1157   SLP Time Calculation (min) 52 min   Activity Tolerance Patient tolerated treatment well      Past Medical History:  Diagnosis Date  . Degenerative joint disease   . ED (erectile dysfunction)   . Hyperlipidemia   . Parkinson disease [facility])     Past Surgical History:  Procedure Laterality Date  . CATARACT EXTRACTION W/ INTRAOCULAR LENS IMPLANT Bilateral   . CYST REMOVAL TRUNK  1/16   Dr Marina Gravel  . FLEXIBLE SIGMOIDOSCOPY  08/05  . Sigmoid volvulus resection  10/13   Dr Pat Patrick    There were no vitals filed for this visit.      Subjective Assessment - 12/17/16 1156    Subjective "It take so much effort"   Currently in Pain? No/denies               ADULT SLP TREATMENT - 12/17/16 0001      General Information   Behavior/Cognition Alert;Cooperative;Pleasant mood     Treatment Provided   Treatment provided Dysphagia     Pain Assessment   Pain Assessment No/denies pain     Cognitive-Linquistic Treatment   Treatment focused on Other (comment)  Dysphagia   Skilled Treatment The patient is doing the recommended exercises accurately and with improved frequency as well as increased number of repetitions per exercise.  He is completing the routine 2 to 3 times per day, most days.  The patient demonstrates decreased coughing while performing his exercises.  He patient reports increased use of nectar-thick liquids and decreased amount of thin liquid, but is reluctant to  completely discontinue thin liquid.  We discussed potential negative sequelae to aspiration and that the observed aspiration during his swallow study was silent.  Patient reports several items that cause specific difficulty (coughing) and we discussed modifications to try.     Assessment / Recommendations / Plan   Plan Continue with current plan of care     Progression Toward Goals   Progression toward goals Progressing toward goals          SLP Education - 12/17/16 1156    Education provided Yes   Education Details Progressive strengthening will involve working to fatigue.   Person(s) Educated Patient   Methods Explanation   Comprehension Verbalized understanding            SLP Long Term Goals - 12/17/16 1158      SLP LONG TERM GOAL #1   Title Patient will independently perform selected swallowing exercises given written instructions.   Time 12   Period Weeks   Status Partially Met     SLP LONG TERM GOAL #2   Title Patient will demonstrate independent understanding and implementation of swallowing strategies.   Time 12   Period Weeks   Status Partially Met          Plan - 12/17/16 1157    Clinical Impression Statement The patient is doing his exercises accurately and his  voice sounds stronger.  He reports fatigue as an interfering barrier to completing the exercises as frequently as recommended.  His program was modified to accommodate this.  Today he was able to complete all 6 exercises with 10 repetitions each.  He is observed to cough less while doing his exercises, indicating improving strength of swallowing.  Recommend repeating MBS to assess for improvement in swallowing safety, update recommendations for diet modification, and update swallowing exercise routine/schedule.   Speech Therapy Frequency 1x /week   Duration Other (comment)   Treatment/Interventions Aspiration precaution training;Pharyngeal strengthening exercises;Patient/family education   Potential  to Achieve Goals Good   Potential Considerations Ability to learn/carryover information;Co-morbidities;Cooperation/participation level;Medical prognosis;Previous level of function;Severity of impairments;Family/community support   SLP Home Exercise Plan Swallowing exercises   Consulted and Agree with Plan of Care Patient      Patient will benefit from skilled therapeutic intervention in order to improve the following deficits and impairments:   Dysphagia, oropharyngeal phase      G-Codes - 12/30/16 1158    Functional Assessment Tool Used clincial judgment   Functional Limitations Swallowing   Swallow Current Status (D7412) At least 40 percent but less than 60 percent impaired, limited or restricted   Swallow Goal Status (I7867) At least 20 percent but less than 40 percent impaired, limited or restricted      Problem List Patient Active Problem List   Diagnosis Date Noted  . Advance directive discussed with patient 08/15/2015  . Left rotator cuff tear 08/15/2015  . Actinic keratosis 12/02/2012  . Routine general medical examination at a health care facility 07/23/2012  . Other constipation 09/09/2011  . Parkinson disease ([place]) 07/07/2011  . HTN (hypertension) 05/30/2011  . Memory loss 10/16/2010  . HYPERLIPIDEMIA 05/17/2007  . ERECTILE DYSFUNCTION 05/17/2007  . DEGENERATIVE JOINT DISEASE 05/17/2007   Leroy Sea, MS/CCC- SLP  Lou Miner Dec 30, 2016, 11:59 AM  [place] [address] [place], [place], [address] Phone: [phone]   Fax:  [phone]   Name: GARVEY WESTCOTT MRN: [id number] Date of Birth: 12/18/1940

## 2016-12-18 ENCOUNTER — Other Ambulatory Visit: Payer: Self-pay | Admitting: Internal Medicine

## 2016-12-18 DIAGNOSIS — R1312 Dysphagia, oropharyngeal phase: Secondary | ICD-10-CM

## 2016-12-31 ENCOUNTER — Ambulatory Visit: Payer: Medicare Other | Admitting: Speech Pathology

## 2016-12-31 ENCOUNTER — Encounter: Payer: Self-pay | Admitting: Speech Pathology

## 2016-12-31 DIAGNOSIS — R1312 Dysphagia, oropharyngeal phase: Secondary | ICD-10-CM

## 2016-12-31 NOTE — Therapy (Signed)
[place] MAIN [facility] SERVICES [address] [place], [place], [address] Phone: [phone]   Fax:  [phone]  Speech Language Pathology Treatment  Patient Details  Name: Johnny Barnett MRN: [id number] Date of Birth: 02/21/1941 Referring Provider: Dr. Silvio Pate  Encounter Date: 12/31/2016      End of Session - 12/31/16 1143    Visit Number 8   Number of Visits 9   Date for SLP Re-Evaluation 01/07/17   SLP Start Time 1100   SLP Stop Time  1143   SLP Time Calculation (min) 43 min   Activity Tolerance Patient tolerated treatment well      Past Medical History:  Diagnosis Date  . Degenerative joint disease   . ED (erectile dysfunction)   . Hyperlipidemia   . Parkinson disease [facility])     Past Surgical History:  Procedure Laterality Date  . CATARACT EXTRACTION W/ INTRAOCULAR LENS IMPLANT Bilateral   . CYST REMOVAL TRUNK  1/16   Dr Marina Gravel  . FLEXIBLE SIGMOIDOSCOPY  08/05  . Sigmoid volvulus resection  10/13   Dr Pat Patrick    There were no vitals filed for this visit.      Subjective Assessment - 12/31/16 1141    Subjective Patient reports less difficulty with meals when he is careful and mindful while eating   Currently in Pain? No/denies               ADULT SLP TREATMENT - 12/31/16 0001      General Information   Behavior/Cognition Alert;Cooperative;Pleasant mood     Treatment Provided   Treatment provided Cognitive-Linquistic     Pain Assessment   Pain Assessment No/denies pain     Cognitive-Linquistic Treatment   Treatment focused on Other (comment)  Dysphagia   Skilled Treatment The patient is doing the recommended exercises accurately and with improved frequency as well as increased number of repetitions per exercise.  He is completing the routine 2 to 3 times per day, most days.  The patient demonstrates decreased coughing while performing his exercises.  He patient reports increased use of nectar-thick liquids and  decreased amount of thin liquid, but is reluctant to completely discontinue thin liquid.  We discussed potential negative sequelae to aspiration and that the observed aspiration during his swallow study was silent.  Patient reports several items that cause specific difficulty (coughing) and we discussed modifications to try.     Assessment / Recommendations / Plan   Plan Continue with current plan of care     Progression Toward Goals   Progression toward goals Progressing toward goals          SLP Education - 12/31/16 1142    Education provided Yes   Education Details Super Supragolttic swallow maneuver   Person(s) Educated Patient   Methods Explanation   Comprehension Verbalized understanding            SLP Long Term Goals - 12/17/16 1158      SLP LONG TERM GOAL #1   Title Patient will independently perform selected swallowing exercises given written instructions.   Time 12   Period Weeks   Status Partially Met     SLP LONG TERM GOAL #2   Title Patient will demonstrate independent understanding and implementation of swallowing strategies.   Time 12   Period Weeks   Status Partially Met          Plan - 12/31/16 1143    Clinical Impression Statement The patient is doing  his exercises accurately and his voice sounds stronger.  He reports fatigue as an interfering barrier to completing the exercises as frequently as recommended.  His program was modified to accommodate this.  Today he was able to complete all 6 exercises with 10 repetitions each.  He is observed to cough less while doing his exercises, indicating improving strength of swallowing.  Will repeat MBS on 01/02/2017 to assess for improvement in swallowing safety, update recommendations for diet modification, and update swallowing exercise routine/schedule.   Speech Therapy Frequency 1x /week   Duration Other (comment)   Treatment/Interventions Aspiration precaution training;Pharyngeal strengthening  exercises;Patient/family education   Potential to Achieve Goals Good   Potential Considerations Ability to learn/carryover information;Co-morbidities;Cooperation/participation level;Medical prognosis;Previous level of function;Severity of impairments;Family/community support   SLP Home Exercise Plan Swallowing exercises   Consulted and Agree with Plan of Care Patient      Patient will benefit from skilled therapeutic intervention in order to improve the following deficits and impairments:   Dysphagia, oropharyngeal phase    Problem List Patient Active Problem List   Diagnosis Date Noted  . Advance directive discussed with patient 08/15/2015  . Left rotator cuff tear 08/15/2015  . Actinic keratosis 12/02/2012  . Routine general medical examination at a health care facility 07/23/2012  . Other constipation 09/09/2011  . Parkinson disease ([place]) 07/07/2011  . HTN (hypertension) 05/30/2011  . Memory loss 10/16/2010  . HYPERLIPIDEMIA 05/17/2007  . ERECTILE DYSFUNCTION 05/17/2007  . DEGENERATIVE JOINT DISEASE 05/17/2007   Leroy Sea, MS/CCC- SLP  Lou Miner 12/31/2016, 11:44 AM  [place] MAIN [facility] SERVICES [address] [place], [place], [address] Phone: [phone]   Fax:  [phone]   Name: Johnny Barnett MRN: [id number] Date of Birth: October 17, 1941

## 2017-01-02 ENCOUNTER — Encounter: Payer: Self-pay | Admitting: Speech Pathology

## 2017-01-02 ENCOUNTER — Ambulatory Visit
Admission: RE | Admit: 2017-01-02 | Discharge: 2017-01-02 | Disposition: A | Discharge status: Home / Self Care | Payer: Medicare Other | Source: Ambulatory Visit | Attending: Internal Medicine | Admitting: Internal Medicine

## 2017-01-02 ENCOUNTER — Ambulatory Visit: Payer: Medicare Other | Admitting: Speech Pathology

## 2017-01-02 DIAGNOSIS — R1312 Dysphagia, oropharyngeal phase: Secondary | ICD-10-CM | POA: Insufficient documentation

## 2017-01-02 NOTE — Therapy (Signed)
[place] MAIN [facility] SERVICES [address] [place], [place], [address] Phone: [phone]   Fax:  [phone]  Speech Language Pathology Treatment/Discharge Summary  Patient Details  Name: Johnny Barnett MRN: [id number] Date of Birth: [birth year] Referring Provider: Dr. Silvio Pate  Encounter Date: 01/02/2017      End of Session - 01/02/17 1503    Visit Number 9   Number of Visits 9   Date for SLP Re-Evaluation 01/07/17   SLP Start Time 83   SLP Stop Time  1500   SLP Time Calculation (min) 60 min   Activity Tolerance Patient tolerated treatment well      Past Medical History:  Diagnosis Date  . Degenerative joint disease   . ED (erectile dysfunction)   . Hyperlipidemia   . Parkinson disease [facility])     Past Surgical History:  Procedure Laterality Date  . CATARACT EXTRACTION W/ INTRAOCULAR LENS IMPLANT Bilateral   . CYST REMOVAL TRUNK  1/16   Dr Marina Gravel  . FLEXIBLE SIGMOIDOSCOPY  08/05  . Sigmoid volvulus resection  10/13   Dr Pat Patrick    There were no vitals filed for this visit.      Subjective Assessment - 01/02/17 1503    Subjective Patient reports less difficulty with meals when he is careful and mindful while eating               ADULT SLP TREATMENT - 01/02/17 0001      General Information   Behavior/Cognition Alert;Cooperative;Pleasant mood     Treatment Provided   Treatment provided Dysphagia     Pain Assessment   Pain Assessment No/denies pain     Cognitive-Linquistic Treatment   Treatment focused on Other (comment)  Dysphagia   Skilled Treatment The patient had a repeat MBS today.  His swallowing recommendations and exercises have been updated.     Assessment / Recommendations / Plan   Plan Discharge SLP treatment due to (comment);Goals updated     Progression Toward Goals   Progression toward goals Goals met, education completed, patient discharged from SLP    General Swallowing Recommendations per  Dr. Ronnald Ramp' MBSS and Susie   Regular (soften and moisten as needed)Sit upright while eating/drinking and stay upright for at least 30 minutes after meals. 3. Small bites/sips (Super Supraglottic Swallow with liquids) 4. Swallow 2-3 times most bites/sips 5. Alternate liquids and solids 6. Brush teeth with toothbrush (scrub all surfaces) before and after meals. 7. Stay physically active  8. Monitor for clinical indicators of aspiration including: ? Increased coughing/strangling at meals ? Wet or gurgly sound to voice ? Upper respiratory symptoms ? Increased secretions 9. Swallowing exercises  ? Shaker Exercise ? Tongue base retraction ? Effortful pitch glide      SLP Education - 01/02/17 1503    Education provided Yes   Education Details Results and recommendations RE: MBS and recommendations for discharge   Person(s) Educated Patient   Methods Explanation   Comprehension Verbalized understanding            SLP Long Term Goals - 01/02/17 1506      SLP LONG TERM GOAL #1   Title Patient will independently perform selected swallowing exercises given written instructions.   Time 12   Period Weeks   Status Achieved     SLP LONG TERM GOAL #2   Title Patient will demonstrate independent understanding and implementation of swallowing strategies.   Time 12   Period Weeks   Status  Achieved          Plan - 13-Jan-2017 1504    Clinical Impression Statement The patient has completed dysphagia therapy.  He had a repeat MBS today.  His swallowing recommendations and exercises have been updated. The patient has been educated and has my card if he has any further questions regarding his swallowing or voice.   Speech Therapy Frequency Other (comment)  Discharge   Treatment/Interventions Aspiration precaution training;Pharyngeal strengthening exercises;Patient/family education   Potential to Achieve Goals Good   Potential Considerations Ability to learn/carryover  information;Co-morbidities;Cooperation/participation level;Medical prognosis;Previous level of function;Severity of impairments;Family/community support   SLP Home Exercise Plan Swallowing exercises   Consulted and Agree with Plan of Care Patient      Patient will benefit from skilled therapeutic intervention in order to improve the following deficits and impairments:   Dysphagia, oropharyngeal phase      G-Codes - 13-Jan-2017 1507    Functional Assessment Tool Used clincial judgment, MBS   Functional Limitations Swallowing   Swallow Current Status (N3614) At least 40 percent but less than 60 percent impaired, limited or restricted   Swallow Goal Status (E3154) At least 40 percent but less than 60 percent impaired, limited or restricted   Swallow Discharge Status [phone]) At least 40 percent but less than 60 percent impaired, limited or restricted      Problem List Patient Active Problem List   Diagnosis Date Noted  . Advance directive discussed with patient 08/15/2015  . Left rotator cuff tear 08/15/2015  . Actinic keratosis 12/02/2012  . Routine general medical examination at a health care facility 07/23/2012  . Other constipation 09/09/2011  . Parkinson disease ([place]) 07/07/2011  . HTN (hypertension) 05/30/2011  . Memory loss 10/16/2010  . HYPERLIPIDEMIA 05/17/2007  . ERECTILE DYSFUNCTION 05/17/2007  . DEGENERATIVE JOINT DISEASE 05/17/2007   Leroy Sea, MS/CCC- SLP  Lou Miner January 13, 2017, 3:08 PM  [place] MAIN [facility] SERVICES [address] [place], [place], [address] Phone: [phone]   Fax:  [phone]   Name: Johnny Barnett MRN: [id number] Date of Birth: 04-09-1941

## 2017-01-02 NOTE — Therapy (Signed)
[place] [facility] DIAGNOSTIC RADIOLOGY [address] [place], [place], [address] Phone: [phone]   Fax:     Modified Barium Swallow  Patient Details  Name: Johnny Barnett MRN: [id number] Date of Birth: [birth year] Referring Provider: Dr. Alphonsus Sias  Encounter Date: 01/02/2017      End of Session - 01/02/17 1551    Visit Number 1   Number of Visits 1   Date for SLP Re-Evaluation 01/02/17   SLP Start Time 1300   SLP Stop Time  1400   SLP Time Calculation (min) 60 min   Activity Tolerance Patient tolerated treatment well      Past Medical History:  Diagnosis Date  . Degenerative joint disease   . ED (erectile dysfunction)   . Hyperlipidemia   . Parkinson disease [facility])     Past Surgical History:  Procedure Laterality Date  . CATARACT EXTRACTION W/ INTRAOCULAR LENS IMPLANT Bilateral   . CYST REMOVAL TRUNK  1/16   Dr Egbert Garibaldi  . FLEXIBLE SIGMOIDOSCOPY  08/05  . Sigmoid volvulus resection  10/13   Dr Michela Pitcher    There were no vitals filed for this visit.      Subjective Assessment - 01/02/17 1503    Subjective Patient reports less difficulty with meals when he is careful and mindful while eating         Subjective:  Patient behavior: (alertness, ability to follow instructions, etc.): The patient is alert, able to follow directions and understand implications of education  Chief complaint: Parkinson's disease with dysphagia.  Patient has just completed dysphagia therapy   Objective:  Radiological Procedure: A videoflouroscopic evaluation of oral-preparatory, reflex initiation, and pharyngeal phases of the swallow was performed; as well as a screening of the upper esophageal phase.  I. POSTURE: Upright in MBS chair  II. VIEW: Lateral  III. COMPENSATORY STRATEGIES: Super supraglottic swallow maneuver aids in preventing aspiration during swallowing  IV. BOLUSES ADMINISTERED:   Thin Liquid: 2 with super supraglottic  swallow   Nectar-thick Liquid: 1 cup rim sip with no strategies; 2 with super supraglottic swallow    Puree: 1 teaspoon presentation   Mechanical Soft: 1/4 graham cracker in applesauce  V. RESULTS OF EVALUATION: A. ORAL PREPARATORY PHASE: (The lips, tongue, and velum are observed for strength and coordination)       **Overall Severity Rating: Minimal; slightly prolonged posterior transfer- within functional limits.  Able to masticate hard solid in moist substrate  B. SWALLOW INITIATION/REFLEX: (The reflex is normal if "triggered" by the time the bolus reached the base of the tongue)  **Overall Severity Rating: Moderate-severe; triggers at the pyriform with liquids   C. PHARYNGEAL PHASE: (Pharyngeal function is normal if the bolus shows rapid, smooth, and continuous transit through the pharynx and there is no pharyngeal residue after the swallow)  **Overall Severity Rating: Moderate-severe; decreased tongue base retraction, decreased hyolaryngeal movement, incomplete epiglottic inversion, severe vallecular residue, mild-moderate pyriform sinus residue  D. LARYNGEAL PENETRATION: (Material entering into the laryngeal inlet/vestibule but not aspirated): flash/transient with use of super supraglottic swallow maneuver   E. ASPIRATION: of pharyngeal residue with solids and before swallow with liquids; weak, delayed cough response  F. ESOPHAGEAL PHASE: (Screening of the upper esophagus): No abnormality within the viewable cervical esophagus  ASSESSMENT: This 76 year old man; with Parkinson's and course of dysphagia treatment ; is presenting with moderate-severe oropharyngeal dysphagia characterized by (minimally) slow/disorganized oral management, severely delayed swallow initiation, decreased tongue base retraction, decreased hyolaryngeal movement, incomplete epiglottic  inversion, severe vallecular residue, mild-moderate pyriform sinus residue, and aspiration (of pharyngeal residue with solids and  before swallow with liquids).  A super supraglottic swallow maneuver improves airway protection during swallow but does not prevent aspiration of pharyngeal residue that falls into the airway between swallows.  Thickened liquids are aspirated at the same rate as thin, so thickening does not appear to be advantageous.  The patient is able to effectively masticate a hard solid within a moist substrate.  He is able to identify problematic foods and modify for ease of swallowing.  The patient is at significant risk for chronic aspiration, and probably has been for some time.  At this point, the patient appears to be able to tolerate aspiration without negative sequelae.  Recommend regular diet (moisten/soften as needed), use of super supraglottic swallow with liquids, continue Shaker Exercise (improves pharyngeal clearance), continue effortful pitch glide (improves hyolaryngeal and epiglottic movement), and general swallowing recommendations outlined at the onset of dysphagia therapy.  The patient should be closely monitored for signs/symptoms of pneumonia and remain as physically active as possible.    PLAN/RECOMMENDATIONS:   A. Diet: Regular (moisten/soften as needed)   B. Swallowing Precautions: Please see speech therapy discharge summary for full swallowing recommendations/precautions   C. Recommended consultation to: PCP for ongoing assessment of risk for negative sequelae to chronic aspiration    D. Therapy recommendations: Please see speech therapy discharge summary for full swallowing recommendations/precautions and home exercise program   E. Results and recommendations were discussed with the patient during our final session immediately following the study.  Final report and speech therapy discharge summary routed to the patient's PCP.   Patient will benefit from skilled therapeutic intervention in order to improve the following deficits and impairments:   Oropharyngeal dysphagia - Plan: DG OP  Swallowing Func-Medicare/Speech Path, DG OP Swallowing Func-Medicare/Speech Path      G-Codes - 01/02/17 1551    Functional Assessment Tool Used MBS   Functional Limitations Swallowing   Swallow Current Status (U0454(G8996) At least 40 percent but less than 60 percent impaired, limited or restricted   Swallow Goal Status (U9811(G8997) At least 40 percent but less than 60 percent impaired, limited or restricted   Swallow Discharge Status [phone](G8998) At least 40 percent but less than 60 percent impaired, limited or restricted          Problem List Patient Active Problem List   Diagnosis Date Noted  . Advance directive discussed with patient 08/15/2015  . Left rotator cuff tear 08/15/2015  . Actinic keratosis 12/02/2012  . Routine general medical examination at a health care facility 07/23/2012  . Other constipation 09/09/2011  . Parkinson disease ([facility]) 07/07/2011  . HTN (hypertension) 05/30/2011  . Memory loss 10/16/2010  . HYPERLIPIDEMIA 05/17/2007  . ERECTILE DYSFUNCTION 05/17/2007  . DEGENERATIVE JOINT DISEASE 05/17/2007   Dollene PrimroseSusan G Lorence Nagengast, MS/CCC- SLP  Leandrew KoyanagiAbernathy, Susie 01/02/2017, 3:54 PM  [place] [facility] DIAGNOSTIC RADIOLOGY [address] [place], [place], [address] Phone: [phone]   Fax:     Name: Johnny Barnett MRN: [id number] Date of Birth: 10/03/1941

## 2017-01-08 ENCOUNTER — Ambulatory Visit: Payer: Medicare Other | Admitting: Podiatry

## 2017-01-09 ENCOUNTER — Ambulatory Visit (INDEPENDENT_AMBULATORY_CARE_PROVIDER_SITE_OTHER): Payer: Medicare Other | Admitting: Podiatry

## 2017-01-09 DIAGNOSIS — B351 Tinea unguium: Secondary | ICD-10-CM | POA: Diagnosis not present

## 2017-01-09 DIAGNOSIS — L608 Other nail disorders: Secondary | ICD-10-CM | POA: Diagnosis not present

## 2017-01-09 DIAGNOSIS — M79609 Pain in unspecified limb: Secondary | ICD-10-CM | POA: Diagnosis not present

## 2017-01-09 DIAGNOSIS — L603 Nail dystrophy: Secondary | ICD-10-CM | POA: Diagnosis not present

## 2017-01-25 NOTE — Progress Notes (Signed)
   SUBJECTIVE Patient  presents to office today complaining of elongated, thickened nails. Pain while ambulating in shoes. Patient is unable to trim their own nails.   OBJECTIVE General Patient is awake, alert, and oriented x 3 and in no acute distress. Derm Skin is dry and supple bilateral. Negative open lesions or macerations. Remaining integument unremarkable. Nails are tender, long, thickened and dystrophic with subungual debris, consistent with onychomycosis, 1-5 bilateral. No signs of infection noted. Vasc  DP and PT pedal pulses palpable bilaterally. Temperature gradient within normal limits.  Neuro Epicritic and protective threshold sensation diminished bilaterally.  Musculoskeletal Exam No symptomatic pedal deformities noted bilateral. Muscular strength within normal limits.  ASSESSMENT 1. Onychodystrophic nails 1-5 bilateral with hyperkeratosis of nails.  2. Onychomycosis of nail due to dermatophyte bilateral 3. Pain in foot bilateral  PLAN OF CARE 1. Patient evaluated today.  2. Instructed to maintain good pedal hygiene and foot care.  3. Mechanical debridement of nails 1-5 bilaterally performed using a nail nipper. Filed with dremel without incident.  4. Return to clinic in 3 mos.    Oluwasemilore Bahl M. Remer Couse, DPM [facility]  Dr. Shyniece Scripter M. Coston Mandato, DPM    [address]                                        [place], [place] [address]                Office [phone]  Fax [phone]      

## 2017-04-13 ENCOUNTER — Encounter: Payer: Self-pay | Admitting: Podiatry

## 2017-04-13 ENCOUNTER — Ambulatory Visit (INDEPENDENT_AMBULATORY_CARE_PROVIDER_SITE_OTHER): Payer: Medicare Other | Admitting: Podiatry

## 2017-04-13 DIAGNOSIS — M79609 Pain in unspecified limb: Secondary | ICD-10-CM | POA: Diagnosis not present

## 2017-04-13 DIAGNOSIS — B351 Tinea unguium: Secondary | ICD-10-CM | POA: Diagnosis not present

## 2017-04-13 NOTE — Progress Notes (Signed)
Complaint:  Visit Type: Patient returns to my office for continued preventative foot care services. Complaint: Patient states" my nails have grown long and thick and become painful to walk and wear shoes" . The patient presents for preventative foot care services. No changes to ROS  Podiatric Exam: Vascular: dorsalis pedis and posterior tibial pulses are palpable bilateral. Capillary return is immediate. Temperature gradient is WNL. Skin turgor WNL  Sensorium: Normal Semmes Weinstein monofilament test. Normal tactile sensation bilaterally. Nail Exam: Pt has thick disfigured discolored nails with subungual debris noted bilateral entire nail hallux through fifth toenails Ulcer Exam: There is no evidence of ulcer or pre-ulcerative changes or infection. Orthopedic Exam: Muscle tone and strength are WNL. No limitations in general ROM. No crepitus or effusions noted. Foot type and digits show no abnormalities. Bony prominences are unremarkable. Skin: No Porokeratosis. No infection or ulcers  Diagnosis:  Onychomycosis, , Pain in right toe, pain in left toes  Treatment & Plan Procedures and Treatment: Consent by patient was obtained for treatment procedures. The patient understood the discussion of treatment and procedures well. All questions were answered thoroughly reviewed. Debridement of mycotic and hypertrophic toenails, 1 through 5 bilateral and clearing of subungual debris. No ulceration, no infection noted.  Return Visit-Office Procedure: Patient instructed to return to the office for a follow up visit 3 months   for continued evaluation and treatment.    Kavish Lafitte DPM 

## 2017-04-28 ENCOUNTER — Encounter: Payer: Self-pay | Admitting: Primary Care

## 2017-04-28 ENCOUNTER — Ambulatory Visit (INDEPENDENT_AMBULATORY_CARE_PROVIDER_SITE_OTHER): Payer: Medicare Other | Admitting: Primary Care

## 2017-04-28 VITALS — BP 120/74 | HR 69 | Temp 97.6°F | Wt 151.1 lb

## 2017-04-28 DIAGNOSIS — H6123 Impacted cerumen, bilateral: Secondary | ICD-10-CM

## 2017-04-28 NOTE — Progress Notes (Signed)
Subjective:    Patient ID: Johnny Barnett, male    DOB: October 27, 1941, 76 y.o.   MRN: [id number]  HPI  Johnny Barnett is a 76 year old male with a history of cerumen impaction, parkinson's disease who presents today with a chief complaint of ear fullness. The fullness is located to the bilateral ears, primarily to the right. This began 1 week ago. Over this past weekend he noticed some discomfort. He denies fevers, chills, cough, sore throat. He's been using Debrox drops without any improvement.   Review of Systems  Constitutional: Negative for fever.  HENT: Negative for congestion and sore throat.        Bilateral ear fullness and right ear discomfort.  Respiratory: Negative for cough.   Neurological: Negative for dizziness.       Past Medical History:  Diagnosis Date  . Degenerative joint disease   . ED (erectile dysfunction)   . Hyperlipidemia   . Parkinson disease [facility])      Social History   Social History  . Marital status: Married    Spouse name: N/A  . Number of children: 2  . Years of education: N/A   Occupational History  . 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 Smoking status: Never Smoker  . Smokeless tobacco: Never Used  . Alcohol use Yes     Comment: wine  . Drug use: No  . Sexual activity: Not on file   Other Topics Concern  . 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 CATARACT EXTRACTION W/ INTRAOCULAR LENS IMPLANT Bilateral   . CYST REMOVAL TRUNK  1/16   Dr Egbert Garibaldi  . FLEXIBLE SIGMOIDOSCOPY  08/05  . Sigmoid volvulus resection  10/13   Dr Michela Pitcher    Family  History  Problem Relation Age of Onset  . Diabetes Paternal Uncle   . Coronary artery disease Neg Hx   . Hypertension Neg Hx     No Known Allergies  Current Outpatient Prescriptions on File Prior to Visit  Medication Sig Dispense Refill  . acetaminophen (TYLENOL) 500 MG tablet Take 500 mg by mouth every 6 (six) hours as needed.    Marland Kitchen aspirin 81 MG EC tablet Take 81 mg by mouth daily.      . carbidopa-levodopa (SINEMET IR) 25-100 MG per tablet Take 1.5 tabs at 730, 1130 and 330. Take one tab at 630.    . Carbidopa-Levodopa ER (SINEMET CR) 25-100 MG tablet controlled release Take 2 tablets by mouth at bedtime.    . fluticasone (FLONASE) 50 MCG/ACT nasal spray PLACE 2 SPRAYS INTO EACH NOSTRIL DAILY. 16 g 11  . losartan (COZAAR) 50 MG tablet Take 1 tablet (50 mg total) by mouth daily. 90 tablet 3  . meloxicam (MOBIC) 15 MG tablet Take 1 tablet by mouth daily.  0  . Multiple Vitamin (MULTIVITAMIN) tablet Take 1 tablet by mouth daily.      Marland Kitchen rOPINIRole (REQUIP) 1 MG tablet Take 1 mg by mouth 3 (three) times  daily.     . triamcinolone cream (KENALOG) 0.1 % APPLY TWICE A DAY AS NEEDED FOR ITCHY AREAS 30 g 1   No current facility-administered medications on file prior to visit.     BP 120/74   Pulse 69   Temp 97.6 F (36.4 C) (Oral)   Wt 151 lb 1.9 oz (68.5 kg)   SpO2 97%   BMI 23.32 kg/m    Objective:   Physical Exam  Constitutional: He appears well-nourished. He does not appear ill.  HENT:  Nose: No mucosal edema. Right sinus exhibits no maxillary sinus tenderness and no frontal sinus tenderness. Left sinus exhibits no maxillary sinus tenderness and no frontal sinus tenderness.  Mouth/Throat: Oropharynx is clear and moist.  Bilateral canals with cerumen impaction.   Eyes: Conjunctivae are normal.  Neck: Neck supple.  Cardiovascular: Normal rate and regular rhythm.   Pulmonary/Chest: Effort normal and breath sounds normal. He has no wheezes. He has no rales.  Skin: Skin is warm  and dry.          Assessment & Plan:  Cerumen Impaction:  Ear fullness x 1 week, discomfort over the last several days. Exam today with moderate cerumen impaction bilaterally; otherwise unremarkable. Irrigation preformed today, cerumen deep within canals, unable to irrigate all bilaterally. Could not visualize TM's.  Will have him use Debrox drops twice daily and return Friday for further irrigation.  Morrie Sheldonlark,Daved Mcfann Kendal, NP

## 2017-04-28 NOTE — Patient Instructions (Addendum)
Use the debrox drops twice daily for the next several days. Leave the drops in your ear for about 10 minutes. Take steamy showers to help remove the wax.  Please schedule a visit on Friday this week to re-irrigate your ears.   It was a pleasure meeting you!   Earwax Buildup, Adult The ears produce a substance called earwax that helps keep bacteria out of the ear and protects the skin in the ear canal. Occasionally, earwax can build up in the ear and cause discomfort or hearing loss. What increases the risk? This condition is more likely to develop in people who:  Are male.  Are elderly.  Naturally produce more earwax.  Clean their ears often with cotton swabs.  Use earplugs often.  Use in-ear headphones often.  Wear hearing aids.  Have narrow ear canals.  Have earwax that is overly thick or sticky.  Have eczema.  Are dehydrated.  Have excess hair in the ear canal.  What are the signs or symptoms? Symptoms of this condition include:  Reduced or muffled hearing.  A feeling of fullness in the ear or feeling that the ear is plugged.  Fluid coming from the ear.  Ear pain.  Ear itch.  Ringing in the ear.  Coughing.  An obvious piece of earwax that can be seen inside the ear canal.  How is this diagnosed? This condition may be diagnosed based on:  Your symptoms.  Your medical history.  An ear exam. During the exam, your health care provider will look into your ear with an instrument called an otoscope.  You may have tests, including a hearing test. How is this treated? This condition may be treated by:  Using ear drops to soften the earwax.  Having the earwax removed by a health care provider. The health care provider may: ? Flush the ear with water. ? Use an instrument that has a loop on the end (curette). ? Use a suction device.  Surgery to remove the wax buildup. This may be done in severe cases.  Follow these instructions at home:  Take  over-the-counter and prescription medicines only as told by your health care provider.  Do not put any objects, including cotton swabs, into your ear. You can clean the opening of your ear canal with a washcloth or facial tissue.  Follow instructions from your health care provider about cleaning your ears. Do not over-clean your ears.  Drink enough fluid to keep your urine clear or pale yellow. This will help to thin the earwax.  Keep all follow-up visits as told by your health care provider. If earwax builds up in your ears often or if you use hearing aids, consider seeing your health care provider for routine, preventive ear cleanings. Ask your health care provider how often you should schedule your cleanings.  If you have hearing aids, clean them according to instructions from the manufacturer and your health care provider. Contact a health care provider if:  You have ear pain.  You develop a fever.  You have blood, pus, or other fluid coming from your ear.  You have hearing loss.  You have ringing in your ears that does not go away.  Your symptoms do not improve with treatment.  You feel like the room is spinning (vertigo). Summary  Earwax can build up in the ear and cause discomfort or hearing loss.  The most common symptoms of this condition include reduced or muffled hearing and a feeling of fullness in the  ear or feeling that the ear is plugged.  This condition may be diagnosed based on your symptoms, your medical history, and an ear exam.  This condition may be treated by using ear drops to soften the earwax or by having the earwax removed by a health care provider.  Do not put any objects, including cotton swabs, into your ear. You can clean the opening of your ear canal with a washcloth or facial tissue. This information is not intended to replace advice given to you by your health care provider. Make sure you discuss any questions you have with your health care  provider. Document Released: 12/04/2004 Document Revised: 01/07/2017 Document Reviewed: 01/07/2017 Elsevier Interactive Patient Education  [employer].

## 2017-05-01 ENCOUNTER — Ambulatory Visit (INDEPENDENT_AMBULATORY_CARE_PROVIDER_SITE_OTHER): Payer: Medicare Other | Admitting: Internal Medicine

## 2017-05-01 ENCOUNTER — Encounter: Payer: Self-pay | Admitting: Internal Medicine

## 2017-05-01 VITALS — BP 118/76 | HR 67 | Temp 97.5°F | Wt 151.0 lb

## 2017-05-01 DIAGNOSIS — H6122 Impacted cerumen, left ear: Secondary | ICD-10-CM

## 2017-05-01 NOTE — Assessment & Plan Note (Signed)
Flushed from left ear and almost completely clear canal after (just slight distal cerumen) Hearing improved Discussed weekly debrox to prevent reoccurence

## 2017-05-01 NOTE — Progress Notes (Signed)
Subjective:    Patient ID: Johnny Barnett, male    DOB: 11/28/1940, 76 y.o.   MRN: [id number]  HPI Here for follow up of cerumen impaction and hearing loss  Has been using the cerumen drops bid since last visit No pain now Hearing is better but still not normal  Current Outpatient Prescriptions on File Prior to Visit  Medication Sig Dispense Refill  . acetaminophen (TYLENOL) 500 MG tablet Take 500 mg by mouth every 6 (six) hours as needed.    Marland Kitchen. aspirin 81 MG EC tablet Take 81 mg by mouth daily.      . carbidopa-levodopa (SINEMET IR) 25-100 MG per tablet Take 1.5 tabs at 730, 1130 and 330. Take one tab at 630.    . Carbidopa-Levodopa ER (SINEMET CR) 25-100 MG tablet controlled release Take 2 tablets by mouth at bedtime.    . fluticasone (FLONASE) 50 MCG/ACT nasal spray PLACE 2 SPRAYS INTO EACH NOSTRIL DAILY. 16 g 11  . losartan (COZAAR) 50 MG tablet Take 1 tablet (50 mg total) by mouth daily. 90 tablet 3  . meloxicam (MOBIC) 15 MG tablet Take 1 tablet by mouth daily.  0  . Multiple Vitamin (MULTIVITAMIN) tablet Take 1 tablet by mouth daily.      Marland Kitchen. rOPINIRole (REQUIP) 1 MG tablet Take 1 mg by mouth 3 (three) times daily.     Marland Kitchen. triamcinolone cream (KENALOG) 0.1 % APPLY TWICE A DAY AS NEEDED FOR ITCHY AREAS 30 g 1   No current facility-administered medications on file prior to visit.     No Known Allergies  Past Medical History:  Diagnosis Date  . Degenerative joint disease   . ED (erectile dysfunction)   . Hyperlipidemia   . Parkinson disease [facility](HCC)     Past Surgical History:  Procedure Laterality Date  . CATARACT EXTRACTION W/ INTRAOCULAR LENS IMPLANT Bilateral   . CYST REMOVAL TRUNK  1/16   Dr Egbert GaribaldiBird  . FLEXIBLE SIGMOIDOSCOPY  08/05  . Sigmoid volvulus resection  10/13   Dr Michela PitcherEly    Family History  Problem Relation Age of Onset  . Diabetes Paternal Uncle   . Coronary artery disease Neg Hx   . Hypertension Neg Hx     Social History   Social History  . Marital  status: Married    Spouse name: N/A  . Number of children: 2  . Years of education: N/A   Occupational History  . 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 Smoking status: Never Smoker  . Smokeless tobacco: Never Used  . Alcohol use Yes     Comment: wine  . Drug use: No  . Sexual activity: Not on file   Other Topics Concern  . 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

## 2017-06-02 ENCOUNTER — Telehealth: Payer: Self-pay | Admitting: Internal Medicine

## 2017-06-02 NOTE — Telephone Encounter (Signed)
Pt dropped off parking placard form. Placed in rx tower

## 2017-06-02 NOTE — Telephone Encounter (Signed)
Form signed by Dr Sharen HonesGutierrez in Dr Karle StarchLetvak's absence. Mailed it back to pt as requested

## 2017-07-20 ENCOUNTER — Ambulatory Visit (INDEPENDENT_AMBULATORY_CARE_PROVIDER_SITE_OTHER): Payer: Medicare Other | Admitting: Podiatry

## 2017-07-20 ENCOUNTER — Encounter: Payer: Self-pay | Admitting: Internal Medicine

## 2017-07-20 ENCOUNTER — Ambulatory Visit: Payer: Medicare Other | Admitting: Family Medicine

## 2017-07-20 ENCOUNTER — Encounter: Payer: Self-pay | Admitting: Podiatry

## 2017-07-20 ENCOUNTER — Ambulatory Visit (INDEPENDENT_AMBULATORY_CARE_PROVIDER_SITE_OTHER): Payer: Medicare Other | Admitting: Internal Medicine

## 2017-07-20 VITALS — BP 122/70 | HR 74 | Temp 97.6°F | Wt 150.0 lb

## 2017-07-20 DIAGNOSIS — J069 Acute upper respiratory infection, unspecified: Secondary | ICD-10-CM | POA: Diagnosis not present

## 2017-07-20 DIAGNOSIS — M79609 Pain in unspecified limb: Secondary | ICD-10-CM

## 2017-07-20 DIAGNOSIS — B351 Tinea unguium: Secondary | ICD-10-CM

## 2017-07-20 DIAGNOSIS — B9789 Other viral agents as the cause of diseases classified elsewhere: Secondary | ICD-10-CM | POA: Diagnosis not present

## 2017-07-20 MED ORDER — HYDROCODONE-HOMATROPINE 5-1.5 MG/5ML PO SYRP: 5.0000 mL | ORAL_SOLUTION | Freq: Three times a day (TID) | ORAL | 0 refills | Status: DC | PRN

## 2017-07-20 NOTE — Progress Notes (Signed)
Subjective:    Patient ID: Johnny Barnett, male    DOB: 03-Dec-1940, 76 y.o.   MRN: [id number]  HPI  Pt presents to the clinic today with c/o nasal congestion and cough. He reports this started 2 days ago. He is blowing clear/yellow mucous out of his nose. The cough is productive of clear mucous. He denies ear pain, sore throat, chills or body aches. He reports he felt like he was running a fever for the last 2 nights, but did not actually take his temperature. He has tried Flonase, Sudafed and Zicam with some relief. He has no history of allergies or breathing problems. He has not had sick contacts that he is aware of.   Review of Systems      Past Medical History:  Diagnosis Date  . Degenerative joint disease   . ED (erectile dysfunction)   . Hyperlipidemia   . Parkinson disease [facility])     Current Outpatient Prescriptions  Medication Sig Dispense Refill  . acetaminophen (TYLENOL) 500 MG tablet Take 500 mg by mouth every 6 (six) hours as needed.    Marland Kitchen aspirin 81 MG EC tablet Take 81 mg by mouth daily.      . carbidopa-levodopa (SINEMET IR) 25-100 MG per tablet Take 1.5 tabs at 730, 1130 and 330. Take one tab at 630.    . Carbidopa-Levodopa ER (SINEMET CR) 25-100 MG tablet controlled release Take 2 tablets by mouth at bedtime.    . fluticasone (FLONASE) 50 MCG/ACT nasal spray PLACE 2 SPRAYS INTO EACH NOSTRIL DAILY. 16 g 11  . losartan (COZAAR) 50 MG tablet Take 1 tablet (50 mg total) by mouth daily. 90 tablet 3  . meloxicam (MOBIC) 15 MG tablet Take 1 tablet by mouth daily.  0  . Multiple Vitamin (MULTIVITAMIN) tablet Take 1 tablet by mouth daily.      Marland Kitchen rOPINIRole (REQUIP) 1 MG tablet Take 1 mg by mouth 3 (three) times daily.     Marland Kitchen triamcinolone cream (KENALOG) 0.1 % APPLY TWICE A DAY AS NEEDED FOR ITCHY AREAS 30 g 1  . HYDROcodone-homatropine (HYCODAN) 5-1.5 MG/5ML syrup Take 5 mLs by mouth every 8 (eight) hours as needed for cough. 120 mL 0   No current facility-administered  medications for this visit.     No Known Allergies  Family History  Problem Relation Age of Onset  . Diabetes Paternal Uncle   . Coronary artery disease Neg Hx   . Hypertension Neg Hx     Social History   Social History  . Marital status: Married    Spouse name: N/A  . Number of children: 2  . Years of education: N/A   Occupational History  . 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 Smoking status: Never Smoker  . Smokeless tobacco: Never Used  . Alcohol use Yes     Comment: wine  . Drug use: No  . Sexual activity: Not on file   Other Topics Concern  . 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  HEENT: Pt reports nasal congestion. Denies eye pain, eye redness, ear pain, ringing in the ears, wax buildup, runny nose, bloody nose, or sore throat. Respiratory: Pt reports cough. Denies difficulty breathing.    No other specific complaints in a complete review of systems (except as listed in HPI above).  Objective:   Physical Exam   BP 122/70   Pulse 74   Temp 97.6 F (36.4 C) (Oral)   Wt 150 lb (68 kg)   SpO2 97%   BMI 23.15 kg/m  Wt Readings from Last 3 Encounters:  07/20/17 150 lb (68 kg)  05/01/17 151 lb (68.5 kg)  04/28/17 151 lb 1.9 oz (68.5 kg)    General: Appears his stated age, in NAD. HEENT: Head: normal shape and size, no sinus tenderness noted;  Throat/Mouth: Teeth present, mucosa pink and moist, + PND, no exudate, lesions or ulcerations noted.  Neck:  No adenopathy noted. Pulmonary/Chest: Normal effort and positive vesicular  breath sounds. No respiratory distress. No wheezes, rales or ronchi noted.   BMET    Component Value Date/Time   NA 131 (L) 08/21/2016 1517   NA 135 (L) 11/07/2014 1216   K 4.0 08/21/2016 1517   K 4.0 11/07/2014 1216   CL 96 08/21/2016 1517   CL 99 11/07/2014 1216   CO2 30 08/21/2016 1517   CO2 30 11/07/2014 1216   GLUCOSE 88 08/21/2016 1517   GLUCOSE 93 11/07/2014 1216   BUN 28 (H) 08/21/2016 1517   BUN 22 (H) 11/07/2014 1216   CREATININE 1.03 08/21/2016 1517   CREATININE 0.95 11/07/2014 1216   CALCIUM 9.2 08/21/2016 1517   CALCIUM 8.5 11/07/2014 1216   GFRNONAA >60 11/07/2014 1216   GFRNONAA >60 08/18/2012 0436   GFRAA >60 11/07/2014 1216   GFRAA >60 08/18/2012 0436    Lipid Panel     Component Value Date/Time   CHOL 183 08/02/2014 1030   TRIG 99.0 08/02/2014 1030   HDL 39.60 08/02/2014 1030   CHOLHDL 5 08/02/2014 1030   VLDL 19.8 08/02/2014 1030   LDLCALC 124 (H) 08/02/2014 1030    CBC    Component Value Date/Time   WBC 7.8 08/21/2016 1517   RBC 3.98 (L) 08/21/2016 1517   HGB 13.2 08/21/2016 1517   HGB 13.0 08/12/2012 0448   HCT 38.3 (L) 08/21/2016 1517   HCT 37.1 (L) 08/12/2012 0448   PLT 222.0 08/21/2016 1517   PLT 285 08/16/2012 1545   MCV 96.2 08/21/2016 1517   MCV 98 08/12/2012 0448   MCH 34.5 (H) 08/12/2012 0448   MCHC 34.6 08/21/2016 1517   RDW 13.3 08/21/2016 1517   RDW 12.5 08/12/2012 0448   LYMPHSABS 1.4 08/21/2016 1517   LYMPHSABS 0.8 (L) 08/12/2012 0448   MONOABS 0.5 08/21/2016 1517   MONOABS 0.4 08/12/2012 0448   EOSABS 0.3 08/21/2016 1517   EOSABS 0.3 08/12/2012 0448   BASOSABS 0.0 08/21/2016 1517   BASOSABS 0.0 08/12/2012 0448    Hgb A1C No results found for: HGBA1C         Assessment & Plan:   Viral URI with Cough:  Continue Flonase Add in Zyrtec OTC RX for Hycodan syrup for cough  Return precautions discussed Nicki ReaperBAITY, Anelia Carriveau, NP

## 2017-07-20 NOTE — Patient Instructions (Signed)
Allergic Rhinitis Allergic rhinitis is when the mucous membranes in the nose respond to allergens. Allergens are particles in the air that cause your body to have an allergic reaction. This causes you to release allergic antibodies. Through a chain of events, these eventually cause you to release histamine into the blood stream. Although meant to protect the body, it is this release of histamine that causes your discomfort, such as frequent sneezing, congestion, and an itchy, runny nose. What are the causes? Seasonal allergic rhinitis (hay fever) is caused by pollen allergens that may come from grasses, trees, and weeds. Year-round allergic rhinitis (perennial allergic rhinitis) is caused by allergens such as house dust mites, pet dander, and mold spores. What are the signs or symptoms?  Nasal stuffiness (congestion).  Itchy, runny nose with sneezing and tearing of the eyes. How is this diagnosed? Your health care provider can help you determine the allergen or allergens that trigger your symptoms. If you and your health care provider are unable to determine the allergen, skin or blood testing may be used. Your health care provider will diagnose your condition after taking your health history and performing a physical exam. Your health care provider may assess you for other related conditions, such as asthma, pink eye, or an ear infection. How is this treated? Allergic rhinitis does not have a cure, but it can be controlled by:  Medicines that block allergy symptoms. These may include allergy shots, nasal sprays, and oral antihistamines.  Avoiding the allergen. Hay fever may often be treated with antihistamines in pill or nasal spray forms. Antihistamines block the effects of histamine. There are over-the-counter medicines that may help with nasal congestion and swelling around the eyes. Check with your health care provider before taking or giving this medicine. If avoiding the allergen or the  medicine prescribed do not work, there are many new medicines your health care provider can prescribe. Stronger medicine may be used if initial measures are ineffective. Desensitizing injections can be used if medicine and avoidance does not work. Desensitization is when a patient is given ongoing shots until the body becomes less sensitive to the allergen. Make sure you follow up with your health care provider if problems continue. Follow these instructions at home: It is not possible to completely avoid allergens, but you can reduce your symptoms by taking steps to limit your exposure to them. It helps to know exactly what you are allergic to so that you can avoid your specific triggers. Contact a health care provider if:  You have a fever.  You develop a cough that does not stop easily (persistent).  You have shortness of breath.  You start wheezing.  Symptoms interfere with normal daily activities. This information is not intended to replace advice given to you by your health care provider. Make sure you discuss any questions you have with your health care provider. Document Released: 07/22/2001 Document Revised: 06/27/2016 Document Reviewed: 07/04/2013 Elsevier Interactive Patient Education  2017 Elsevier Inc.  

## 2017-07-20 NOTE — Progress Notes (Signed)
Complaint:  Visit Type: Patient returns to my office for continued preventative foot care services. Complaint: Patient states" my nails have grown long and thick and become painful to walk and wear shoes" . The patient presents for preventative foot care services. No changes to ROS  Podiatric Exam: Vascular: dorsalis pedis and posterior tibial pulses are palpable bilateral. Capillary return is immediate. Temperature gradient is WNL. Skin turgor WNL  Sensorium: Normal Semmes Weinstein monofilament test. Normal tactile sensation bilaterally. Nail Exam: Pt has thick disfigured discolored nails with subungual debris noted bilateral entire nail hallux through fifth toenails Ulcer Exam: There is no evidence of ulcer or pre-ulcerative changes or infection. Orthopedic Exam: Muscle tone and strength are WNL. No limitations in general ROM. No crepitus or effusions noted. Foot type and digits show no abnormalities. Bony prominences are unremarkable. Skin: No Porokeratosis. No infection or ulcers  Diagnosis:  Onychomycosis, , Pain in right toe, pain in left toes  Treatment & Plan Procedures and Treatment: Consent by patient was obtained for treatment procedures. The patient understood the discussion of treatment and procedures well. All questions were answered thoroughly reviewed. Debridement of mycotic and hypertrophic toenails, 1 through 5 bilateral and clearing of subungual debris. No ulceration, no infection noted.  Return Visit-Office Procedure: Patient instructed to return to the office for a follow up visit 3 months   for continued evaluation and treatment.    Keslie Gritz DPM 

## 2017-07-23 ENCOUNTER — Ambulatory Visit (INDEPENDENT_AMBULATORY_CARE_PROVIDER_SITE_OTHER): Payer: Medicare Other | Admitting: Internal Medicine

## 2017-07-23 ENCOUNTER — Encounter: Payer: Self-pay | Admitting: Internal Medicine

## 2017-07-23 VITALS — BP 118/80 | HR 63 | Temp 98.0°F | Wt 147.5 lb

## 2017-07-23 DIAGNOSIS — B9789 Other viral agents as the cause of diseases classified elsewhere: Secondary | ICD-10-CM | POA: Diagnosis not present

## 2017-07-23 DIAGNOSIS — J069 Acute upper respiratory infection, unspecified: Secondary | ICD-10-CM | POA: Diagnosis not present
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
## 2017-07-23 NOTE — Patient Instructions (Signed)

## 2017-08-12 ENCOUNTER — Telehealth: Payer: Self-pay | Admitting: Internal Medicine

## 2017-08-12 NOTE — Telephone Encounter (Signed)
The high dose is more effective but slightly higher risk of side effects. If he has never had problems with flu shots, he should try the high dose (but regular is okay)

## 2017-08-12 NOTE — Telephone Encounter (Signed)
Patient called and asked if Dr.Letvak recommends he get the high dose flu shot or regular flu shot?  If patient's not available, he said a detailed message can be left on his voice mail.

## 2017-08-12 NOTE — Telephone Encounter (Signed)
Spoke to pt. He will decide which one he wants to get.

## 2017-08-24 ENCOUNTER — Encounter: Payer: Self-pay | Admitting: Internal Medicine

## 2017-08-24 ENCOUNTER — Ambulatory Visit (INDEPENDENT_AMBULATORY_CARE_PROVIDER_SITE_OTHER): Payer: Medicare Other | Admitting: Internal Medicine

## 2017-08-24 VITALS — BP 132/84 | HR 65 | Temp 97.5°F | Ht 68.5 in | Wt 147.0 lb

## 2017-08-24 DIAGNOSIS — K5909 Other constipation: Secondary | ICD-10-CM | POA: Diagnosis not present

## 2017-08-24 DIAGNOSIS — Z7189 Other specified counseling: Secondary | ICD-10-CM

## 2017-08-24 DIAGNOSIS — Z0000 Encounter for general adult medical examination without abnormal findings: Secondary | ICD-10-CM | POA: Diagnosis not present

## 2017-08-24 DIAGNOSIS — E440 Moderate protein-calorie malnutrition: Secondary | ICD-10-CM | POA: Insufficient documentation

## 2017-08-24 DIAGNOSIS — Z23 Encounter for immunization: Secondary | ICD-10-CM | POA: Diagnosis not present

## 2017-08-24 DIAGNOSIS — I10 Essential (primary) hypertension: Secondary | ICD-10-CM | POA: Diagnosis not present

## 2017-08-24 DIAGNOSIS — E441 Mild protein-calorie malnutrition: Secondary | ICD-10-CM

## 2017-08-24 DIAGNOSIS — G20 Parkinson's disease: Secondary | ICD-10-CM | POA: Diagnosis not present

## 2017-08-24 LAB — COMPREHENSIVE METABOLIC PANEL
ALT: 4 U/L (ref 0–53)
AST: 15 U/L (ref 0–37)
Albumin: 4 g/dL (ref 3.5–5.2)
Alkaline Phosphatase: 45 U/L (ref 39–117)
BILIRUBIN TOTAL: 0.5 mg/dL (ref 0.2–1.2)
BUN: 34 mg/dL — ABNORMAL HIGH (ref 6–23)
CALCIUM: 9.4 mg/dL (ref 8.4–10.5)
CHLORIDE: 95 meq/L — AB (ref 96–112)
CO2: 32 mEq/L (ref 19–32)
Creatinine, Ser: 1.09 mg/dL (ref 0.40–1.50)
GFR: 69.81 mL/min (ref >60.00)
Glucose, Bld: 91 mg/dL (ref 70–99)
Potassium: 4.2 mEq/L (ref 3.5–5.1)
Sodium: 131 mEq/L — ABNORMAL LOW (ref 135–145)
TOTAL PROTEIN: 7.1 g/dL (ref 6.0–8.3)

## 2017-08-24 LAB — CBC
HCT: 38.8 % — ABNORMAL LOW (ref 39.0–52.0)
HEMOGLOBIN: 13.3 g/dL (ref 13.0–17.0)
MCHC: 34.2 g/dL (ref 30.0–36.0)
MCV: 99 fl (ref 78.0–100.0)
Platelets: 228 10*3/uL (ref 150.0–400.0)
RBC: 3.92 Mil/uL — ABNORMAL LOW (ref 4.22–5.81)
RDW: 12.8 % (ref 11.5–15.5)
WBC: 6.2 10*3/uL (ref 4.0–10.5)

## 2017-08-24 LAB — VITAMIN B12: VITAMIN B 12: 629 pg/mL (ref 211–911)

## 2017-08-24 LAB — T4, FREE: FREE T4: 0.96 ng/dL (ref 0.60–1.60)

## 2017-08-24 MED ORDER — TRIAMCINOLONE ACETONIDE 0.1 % EX CREA: TOPICAL_CREAM | CUTANEOUS | 3 refills | Status: DC

## 2017-08-24 MED ORDER — LOSARTAN POTASSIUM 50 MG PO TABS: 50.0000 mg | ORAL_TABLET | Freq: Every day | ORAL | 3 refills | Status: DC

## 2017-08-24 MED ORDER — FLUTICASONE PROPIONATE 50 MCG/ACT NA SUSP: NASAL | 11 refills | Status: DC

## 2017-08-24 NOTE — Progress Notes (Signed)
Subjective:    Patient ID: Johnny Barnett, male    DOB: 14-Apr-1941, 76 y.o.   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 Some short term memory problems  He is concerned about some degree of fecal incontinence No warning and not able to make it in time Constipation is better--usually formed stools Still on miralax (1/2 capful) and metamucil Wearing protection Discussed trying off the psyllium and adjusting the miralax   Has lost 10# since last week Dr Johnny Barnett has recommended resistance training--he has started this with trainer Feels he has lost the weight due to inactivity---- they both feel it is muscle loss Hasn't started ensure yet Eats regular diet despite some swallowing problems (he is careful)  Stamina is down Gets SOB easier with exercise He doesn't feel he gets as deep a breath as he used to No fever or cough in the past month--did clear from respiratory infection last month No chest pain No palpitations  No syncope but he does feel dizzy upon standing--has to stand for a few seconds till his balance back  Eyesight is worsening Recent eye exam--no change in prescription Harder to focus to read a book----very little range for good reading vision  Ongoing issues with Parkinson's  Continues with Dr Johnny Barnett  Current Outpatient Prescriptions on File Prior to Visit  Medication Sig Dispense Refill  . acetaminophen (TYLENOL) 500 MG tablet Take 500 mg by mouth every 6 (six) hours as needed.    Marland Kitchen aspirin 81 MG EC tablet Take 81 mg by mouth daily.       . carbidopa-levodopa (SINEMET IR) 25-100 MG per tablet Take 1.5 tabs at 730, 1130 and 330. Take one tab at 630.    . Carbidopa-Levodopa ER (SINEMET CR) 25-100 MG tablet controlled release Take 2 tablets by mouth at bedtime.    . fluticasone (FLONASE) 50 MCG/ACT nasal spray PLACE 2 SPRAYS INTO EACH NOSTRIL DAILY. 16 g 11  . losartan (COZAAR) 50 MG tablet Take 1 tablet (50 mg total) by mouth daily. 90 tablet 3  . Multiple Vitamin (MULTIVITAMIN) tablet Take 1 tablet by mouth daily.      Marland Kitchen rOPINIRole (REQUIP) 1 MG tablet Take 1 mg by mouth 3 (three) times daily.     Marland Kitchen triamcinolone cream (KENALOG) 0.1 % APPLY TWICE A DAY AS NEEDED FOR ITCHY AREAS 30 g 1   No current facility-administered medications on file prior to visit.     No Known Allergies  Past Medical History:  Diagnosis Date  . Degenerative joint disease   . ED (erectile dysfunction)   . Hyperlipidemia   . Parkinson disease [facility])     Past Surgical History:  Procedure Laterality Date  . CATARACT EXTRACTION W/ INTRAOCULAR LENS IMPLANT Bilateral   . CYST REMOVAL TRUNK  1/16   Dr Egbert Garibaldi  . FLEXIBLE SIGMOIDOSCOPY  08/05  . Sigmoid volvulus resection  10/13   Dr Michela Pitcher    Family History  Problem Relation Age of Onset  . Diabetes Paternal Uncle   . Coronary artery disease Neg Hx   . Hypertension Neg Hx  Social History   Social History  . Marital status: Married    Spouse name: N/A  . Number of children: 2  . Years of education: N/A   Occupational History  . 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 Smoking status: Never Smoker  . Smokeless tobacco: Never Used  . Alcohol use Yes     Comment: wine  . Drug use: No  . Sexual activity: Not on file   Other Topics Concern  . 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 No dermatologist No heartburn Wears seat belt Teeth are okay---- keeps up with dentist    Objective:   Physical Exam  Constitutional: He is oriented to person, place, and time. No distress.  HENT:  Mouth/Throat: Oropharynx is clear and moist. No oropharyngeal exudate.  Neck: No thyromegaly present.  Cardiovascular: Normal rate, regular rhythm, normal heart sounds and intact distal pulses.  Exam reveals no gallop.   No murmur heard. Pulmonary/Chest: Effort normal and breath sounds normal. No respiratory distress. He has no wheezes. He has no rales.  Abdominal: Soft. There is no tenderness.  Musculoskeletal: He exhibits no edema or tenderness.  Lymphadenopathy:    He has no cervical adenopathy.  Neurological: He is alert and oriented to person, place, and time.  President--- "Garnet Koyanagi, Obama (took a while), Bush" [id number] Recall 2/3  Mild bradykinesia No sig tremor  Skin: No rash noted. No erythema.  Psychiatric: He has a normal mood and affect. His behavior is normal.          Assessment & Plan:

## 2017-08-24 NOTE — Assessment & Plan Note (Signed)
BP Readings from Last 3 Encounters:  08/24/17 132/84  07/23/17 118/80  07/20/17 122/70   Good control Would cut the losartan if brief orthostatic symptoms worsen

## 2017-08-24 NOTE — Assessment & Plan Note (Signed)
I have personally reviewed the Medicare Annual Wellness questionnaire and have noted 1. The patient's medical and social history 2. Their use of alcohol, tobacco or illicit drugs 3. Their current medications and supplements 4. The patient's functional ability including ADL's, fall risks, home safety risks and hearing or visual             impairment. 5. Diet and physical activities 6. Evidence for depression or mood disorders  The patients weight, height, BMI and visual acuity have been recorded in the chart I have made referrals, counseling and provided education to the patient based review of the above and I have provided the pt with a written personalized care plan for preventive services.  I have provided you with a copy of your personalized plan for preventive services. Please take the time to review along with your updated medication list.  Will update flu vaccine and pneumovax Discussed exercise No cancer screening due to age

## 2017-08-24 NOTE — Assessment & Plan Note (Signed)
See social history 

## 2017-08-24 NOTE — Assessment & Plan Note (Signed)
He has started resistance training Discussed adding supplements

## 2017-08-24 NOTE — Patient Instructions (Signed)
Please start an ensure or boost--- 1-2 cans daily in between meals. Please try without the psyllium--and send me an email if you have ongoing bowel problems

## 2017-08-24 NOTE — Addendum Note (Signed)
Addended by: Eual Fines on: 08/24/2017 01:00 PM   Modules accepted: Orders

## 2017-08-24 NOTE — Assessment & Plan Note (Signed)
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

## 2017-08-24 NOTE — Assessment & Plan Note (Signed)
Now with some incontinence as well Discussed trying off the psyllium and adjusting miralax

## 2017-10-19 ENCOUNTER — Ambulatory Visit: Payer: Medicare Other | Admitting: Podiatry

## 2017-10-26 ENCOUNTER — Encounter: Payer: Self-pay | Admitting: Podiatry

## 2017-10-26 ENCOUNTER — Ambulatory Visit (INDEPENDENT_AMBULATORY_CARE_PROVIDER_SITE_OTHER): Payer: Medicare Other | Admitting: Podiatry

## 2017-10-26 DIAGNOSIS — M79609 Pain in unspecified limb: Secondary | ICD-10-CM

## 2017-10-26 DIAGNOSIS — B351 Tinea unguium: Secondary | ICD-10-CM | POA: Diagnosis not present

## 2017-10-26 NOTE — Progress Notes (Signed)
Complaint:  Visit Type: Patient returns to my office for continued preventative foot care services. Complaint: Patient states" my nails have grown long and thick and become painful to walk and wear shoes" . The patient presents for preventative foot care services. No changes to ROS  Podiatric Exam: Vascular: dorsalis pedis and posterior tibial pulses are palpable bilateral. Capillary return is immediate. Temperature gradient is WNL. Skin turgor WNL  Sensorium: Normal Semmes Weinstein monofilament test. Normal tactile sensation bilaterally. Nail Exam: Pt has thick disfigured discolored nails with subungual debris noted bilateral entire nail hallux through fifth toenails Ulcer Exam: There is no evidence of ulcer or pre-ulcerative changes or infection. Orthopedic Exam: Muscle tone and strength are WNL. No limitations in general ROM. No crepitus or effusions noted. Foot type and digits show no abnormalities. Bony prominences are unremarkable. Skin: No Porokeratosis. No infection or ulcers  Diagnosis:  Onychomycosis, , Pain in right toe, pain in left toes  Treatment & Plan Procedures and Treatment: Consent by patient was obtained for treatment procedures. The patient understood the discussion of treatment and procedures well. All questions were answered thoroughly reviewed. Debridement of mycotic and hypertrophic toenails, 1 through 5 bilateral and clearing of subungual debris. No ulceration, no infection noted.  Return Visit-Office Procedure: Patient instructed to return to the office for a follow up visit 3 months   for continued evaluation and treatment.    Brayley Mackowiak DPM 

## 2018-01-04 ENCOUNTER — Encounter: Payer: Self-pay | Admitting: Podiatry

## 2018-01-04 ENCOUNTER — Ambulatory Visit (INDEPENDENT_AMBULATORY_CARE_PROVIDER_SITE_OTHER): Payer: Medicare Other | Admitting: Podiatry

## 2018-01-04 DIAGNOSIS — B351 Tinea unguium: Secondary | ICD-10-CM | POA: Diagnosis not present

## 2018-01-04 DIAGNOSIS — M79609 Pain in unspecified limb: Secondary | ICD-10-CM

## 2018-01-04 NOTE — Progress Notes (Signed)
Complaint:  Visit Type: Patient returns to my office for continued preventative foot care services. Complaint: Patient states" my nails have grown long and thick and become painful to walk and wear shoes" . The patient presents for preventative foot care services. No changes to ROS  Podiatric Exam: Vascular: dorsalis pedis and posterior tibial pulses are palpable bilateral. Capillary return is immediate. Temperature gradient is WNL. Skin turgor WNL  Sensorium: Normal Semmes Weinstein monofilament test. Normal tactile sensation bilaterally. Nail Exam: Pt has thick disfigured discolored nails with subungual debris noted bilateral entire nail hallux through fifth toenails Ulcer Exam: There is no evidence of ulcer or pre-ulcerative changes or infection. Orthopedic Exam: Muscle tone and strength are WNL. No limitations in general ROM. No crepitus or effusions noted. Foot type and digits show no abnormalities. Bony prominences are unremarkable. Skin: No Porokeratosis. No infection or ulcers  Diagnosis:  Onychomycosis, , Pain in right toe, pain in left toes  Treatment & Plan Procedures and Treatment: Consent by patient was obtained for treatment procedures. The patient understood the discussion of treatment and procedures well. All questions were answered thoroughly reviewed. Debridement of mycotic and hypertrophic toenails, 1 through 5 bilateral and clearing of subungual debris. No ulceration, no infection noted.  Return Visit-Office Procedure: Patient instructed to return to the office for a follow up visit 3 months   for continued evaluation and treatment.    Ira Dougher DPM 

## 2018-01-21 DIAGNOSIS — H539 Unspecified visual disturbance: Secondary | ICD-10-CM | POA: Insufficient documentation

## 2018-03-18 ENCOUNTER — Ambulatory Visit (INDEPENDENT_AMBULATORY_CARE_PROVIDER_SITE_OTHER): Payer: Medicare Other | Admitting: Podiatry

## 2018-03-18 ENCOUNTER — Encounter: Payer: Self-pay | Admitting: Podiatry

## 2018-03-18 DIAGNOSIS — B351 Tinea unguium: Secondary | ICD-10-CM

## 2018-03-18 DIAGNOSIS — M79609 Pain in unspecified limb: Secondary | ICD-10-CM | POA: Diagnosis not present

## 2018-03-18 NOTE — Progress Notes (Signed)
Complaint:  Visit Type: Patient returns to my office for continued preventative foot care services. Complaint: Patient states" my nails have grown long and thick and become painful to walk and wear shoes" . The patient presents for preventative foot care services. No changes to ROS  Podiatric Exam: Vascular: dorsalis pedis and posterior tibial pulses are palpable bilateral. Capillary return is immediate. Temperature gradient is WNL. Skin turgor WNL  Sensorium: Normal Semmes Weinstein monofilament test. Normal tactile sensation bilaterally. Nail Exam: Pt has thick disfigured discolored nails with subungual debris noted bilateral entire nail hallux through fifth toenails Ulcer Exam: There is no evidence of ulcer or pre-ulcerative changes or infection. Orthopedic Exam: Muscle tone and strength are WNL. No limitations in general ROM. No crepitus or effusions noted. Foot type and digits show no abnormalities. Bony prominences are unremarkable. Skin: No Porokeratosis. No infection or ulcers  Diagnosis:  Onychomycosis, , Pain in right toe, pain in left toes  Treatment & Plan Procedures and Treatment: Consent by patient was obtained for treatment procedures. The patient understood the discussion of treatment and procedures well. All questions were answered thoroughly reviewed. Debridement of mycotic and hypertrophic toenails, 1 through 5 bilateral and clearing of subungual debris. No ulceration, no infection noted.  Return Visit-Office Procedure: Patient instructed to return to the office for a follow up visit 3 months   for continued evaluation and treatment.    Shadonna Benedick DPM 

## 2018-04-08 ENCOUNTER — Encounter: Payer: Self-pay | Admitting: Family Medicine

## 2018-04-08 ENCOUNTER — Ambulatory Visit: Payer: Medicare Other | Admitting: Family Medicine

## 2018-04-08 VITALS — BP 156/90 | HR 63 | Temp 98.1°F | Ht 68.5 in | Wt 151.5 lb

## 2018-04-08 DIAGNOSIS — I10 Essential (primary) hypertension: Secondary | ICD-10-CM

## 2018-04-08 DIAGNOSIS — K5909 Other constipation: Secondary | ICD-10-CM | POA: Diagnosis not present

## 2018-04-08 NOTE — Assessment & Plan Note (Signed)
Labile bp from parkinsons (suspect) BP: (!) 156/90    Numerous bp at home in 90s/60s however  Apprehensive to adjust medication  Disc fall prevention

## 2018-04-08 NOTE — Progress Notes (Signed)
Subjective:    Patient ID: Johnny Barnett, male    DOB: October 02, 1941, 77 y.o.   MRN: [id number]  HPI 77 yo pt of Dr Alphonsus Sias with hx of parkinsons and HTN  here with bowel concerns  Wt Readings from Last 3 Encounters:  04/08/18 151 lb 8 oz (68.7 kg)  08/24/17 147 lb (66.7 kg)  07/23/17 147 lb 8 oz (66.9 kg)   22.70 kg/m   By record review-constipation is chronic and he also suffers from some incontinence  miralax has been recommended  Also psyllium and fODMAP diet   Loosing more control of bowels   Takes miralax 1/2 dose per day  Every 3-4 days he has 2-3 bm within several hours  Was told that pattern was ok  However now has episodes that he has no control over (for example when he stands)  For last 2-3 days more of this  Mud consistency- not very formed   Some days has a lot of gas as well -bothersome  occ mucous in stool  No blood in stool  No abdominal pain  No cramping  No longer takes psyllium   Does not drink as much water as he should  Drinks coffee and tea  Dairy does not bother him  Does eat yogurt  Lots of fruits and vegetables  One day ate too many strawberries-was worse   Had a colonoscopy 9/13 - sigmoid volvulus was successfully decompressed    bp BP Readings from Last 3 Encounters:  04/08/18 (!) 156/90  08/24/17 132/84  07/23/17 118/80   He has had both high and low blood pressures  As low as 92/54 and as high ast 165/103   Patient Active Problem List   Diagnosis Date Noted  . Malnutrition of mild degree (HCC) 08/24/2017  . Advance directive discussed with patient 08/15/2015  . Actinic keratosis 12/02/2012  . Routine general medical examination at a health care facility 07/23/2012  . Other constipation 09/09/2011  . Parkinson disease (HCC) 07/07/2011  . HTN (hypertension) 05/30/2011  . Memory loss 10/16/2010  . HYPERLIPIDEMIA 05/17/2007  . ERECTILE DYSFUNCTION 05/17/2007  . DEGENERATIVE JOINT DISEASE 05/17/2007   Past Medical History:   Diagnosis Date  . Degenerative joint disease   . ED (erectile dysfunction)   . Hyperlipidemia   . Parkinson disease [facility])    Past Surgical History:  Procedure Laterality Date  . CATARACT EXTRACTION W/ INTRAOCULAR LENS IMPLANT Bilateral   . CYST REMOVAL TRUNK  1/16   Dr Egbert Garibaldi  . FLEXIBLE SIGMOIDOSCOPY  08/05  . Sigmoid volvulus resection  10/13   Dr Michela Pitcher   Social History   Tobacco Use  . Smoking status: Never Smoker  . Smokeless tobacco: Never Used  Substance Use Topics  . Alcohol use: Yes    Comment: wine  . Drug use: No   Family History  Problem Relation Age of Onset  . Diabetes Paternal Uncle   . Coronary artery disease Neg Hx   . Hypertension Neg Hx    No Known Allergies Current Outpatient Medications on File Prior to Visit  Medication Sig Dispense Refill  . acetaminophen (TYLENOL) 500 MG tablet Take 500 mg by mouth every 6 (six) hours as needed.    Marland Kitchen aspirin 81 MG EC tablet Take 81 mg by mouth daily.      Marland Kitchen aspirin EC 81 MG tablet Take by mouth.    . carbidopa-levodopa (SINEMET IR) 25-100 MG per tablet Take 1.5 tabs at 730, 1130 and 330.  Take one tab at 630.    . Carbidopa-Levodopa ER (SINEMET CR) 25-100 MG tablet controlled release Take 2 tablets by mouth at bedtime.    . fluticasone (FLONASE) 50 MCG/ACT nasal spray PLACE 2 SPRAYS INTO EACH NOSTRIL DAILY. 16 g 11  . losartan (COZAAR) 50 MG tablet Take 1 tablet (50 mg total) by mouth daily. 90 tablet 3  . Multiple Vitamin (MULTIVITAMIN) tablet Take 1 tablet by mouth daily.      . polyethylene glycol powder (GLYCOLAX/MIRALAX) powder Take 17 g by mouth daily.    Marland Kitchen rOPINIRole (REQUIP) 1 MG tablet Take 1 mg by mouth 3 (three) times daily.     Marland Kitchen senna-docusate (SENOKOT-S) 8.6-50 MG tablet Take by mouth.    . triamcinolone cream (KENALOG) 0.1 % APPLY TWICE A DAY AS NEEDED FOR ITCHY AREAS 30 g 3   No current facility-administered medications on file prior to visit.     Review of Systems  Constitutional: Positive for  fatigue. Negative for activity change, appetite change, fever and unexpected weight change.  HENT: Negative for congestion, rhinorrhea, sore throat and trouble swallowing.   Eyes: Negative for pain, redness, itching and visual disturbance.  Respiratory: Negative for cough, chest tightness, shortness of breath and wheezing.   Cardiovascular: Negative for chest pain and palpitations.       Labile bp with parkinsons  Gastrointestinal: Positive for constipation and diarrhea. Negative for abdominal distention, abdominal pain, anal bleeding, blood in stool, nausea, rectal pain and vomiting.       Fecal incontinence when stool is loose   Endocrine: Negative for cold intolerance, heat intolerance, polydipsia and polyuria.  Genitourinary: Negative for difficulty urinating, dysuria, frequency and urgency.  Musculoskeletal: Negative for arthralgias, joint swelling and myalgias.  Skin: Negative for pallor and rash.  Neurological: Negative for dizziness, tremors, syncope, speech difficulty, weakness, light-headedness, numbness and headaches.       Tremor/bradykinesia from parkinsons  Hematological: Negative for adenopathy. Does not bruise/bleed easily.  Psychiatric/Behavioral: Negative for decreased concentration and dysphoric mood. The patient is not nervous/anxious.        Objective:   Physical Exam  Constitutional: He appears well-developed and well-nourished. No distress.  Well appearing   HENT:  Head: Normocephalic and atraumatic.  Mouth/Throat: Oropharynx is clear and moist.  Eyes: Pupils are equal, round, and reactive to light. Conjunctivae and EOM are normal. No scleral icterus.  Neck: Normal range of motion. Neck supple.  Cardiovascular: Normal rate, regular rhythm and normal heart sounds.  Pulmonary/Chest: Effort normal and breath sounds normal. No respiratory distress. He has no wheezes. He has no rales.  Abdominal: Soft. Bowel sounds are normal. He exhibits no distension, no abdominal  bruit, no ascites and no mass. There is no tenderness. There is no rigidity, no rebound, no guarding, no tenderness at McBurney's point and negative Murphy's sign. No hernia.  Lymphadenopathy:    He has no cervical adenopathy.  Neurological: He is alert. No cranial nerve deficit.  Bradykinesia but no tremor  Slightly unsteady on feet  Masked face- of parkinsons   Skin: Skin is warm and dry. No erythema. No pallor.  Psychiatric: He has a normal mood and affect.  Pleasant           Assessment & Plan:   Problem List Items Addressed This Visit      Cardiovascular and Mediastinum   HTN (hypertension)    Labile bp from parkinsons (suspect) BP: (!) 156/90    Numerous bp at home in 90s/60s however  Apprehensive to adjust medication  Disc fall prevention         Other   Other constipation - Primary    miralax has helped however now with more loose stool pt has problems with fecal incontinence (may be parkinsons related)  Suggest trial of psyllium to bulk up stool  Then if needed cut back on miralax  See what adjustment works and update  No red flags for inflam or infx bowel disease   Will update

## 2018-04-08 NOTE — Patient Instructions (Signed)
Go back to 2 Psyllium capsules a day to bulk up stool (thus making it more controllable)  Keep up a good fluid intake   After 3-4 days - you can decide whether to keep current miralax or cut back to every other day   Alert [place] if abdominal pain / nausea/ blood in stool or any other symptoms   Keep [place] posted

## 2018-04-08 NOTE — Assessment & Plan Note (Signed)
miralax has helped however now with more loose stool pt has problems with fecal incontinence (may be parkinsons related)  Suggest trial of psyllium to bulk up stool  Then if needed cut back on miralax  See what adjustment works and update  No red flags for inflam or infx bowel disease   Will update

## 2018-05-27 ENCOUNTER — Encounter: Payer: Self-pay | Admitting: Podiatry

## 2018-05-27 ENCOUNTER — Ambulatory Visit: Payer: Medicare Other | Admitting: Podiatry

## 2018-05-27 DIAGNOSIS — B351 Tinea unguium: Secondary | ICD-10-CM

## 2018-05-27 DIAGNOSIS — M79609 Pain in unspecified limb: Secondary | ICD-10-CM | POA: Diagnosis not present

## 2018-05-27 NOTE — Progress Notes (Signed)
Complaint:  Visit Type: Patient returns to my office for continued preventative foot care services. Complaint: Patient states" my nails have grown long and thick and become painful to walk and wear shoes" . The patient presents for preventative foot care services. No changes to ROS  Podiatric Exam: Vascular: dorsalis pedis and posterior tibial pulses are palpable bilateral. Capillary return is immediate. Temperature gradient is WNL. Skin turgor WNL  Sensorium: Normal Semmes Weinstein monofilament test. Normal tactile sensation bilaterally. Nail Exam: Pt has thick disfigured discolored nails with subungual debris noted bilateral entire nail hallux through fifth toenails Ulcer Exam: There is no evidence of ulcer or pre-ulcerative changes or infection. Orthopedic Exam: Muscle tone and strength are WNL. No limitations in general ROM. No crepitus or effusions noted. Foot type and digits show no abnormalities. Bony prominences are unremarkable. Skin: No Porokeratosis. No infection or ulcers  Diagnosis:  Onychomycosis, , Pain in right toe, pain in left toes  Treatment & Plan Procedures and Treatment: Consent by patient was obtained for treatment procedures. The patient understood the discussion of treatment and procedures well. All questions were answered thoroughly reviewed. Debridement of mycotic and hypertrophic toenails, 1 through 5 bilateral and clearing of subungual debris. No ulceration, no infection noted.  Return Visit-Office Procedure: Patient instructed to return to the office for a follow up visit 10 weeks  for continued evaluation and treatment.    Nyeisha Goodall DPM 

## 2018-05-31 ENCOUNTER — Telehealth: Payer: Self-pay | Admitting: Internal Medicine

## 2018-05-31 NOTE — Telephone Encounter (Signed)
Pt last seen 04/08/18 for acute visit; 08/25/47 annual; Pt scheduled 08/27/18 for CPX. Dr Alphonsus SiasLetvak out of office Please advise.

## 2018-05-31 NOTE — Telephone Encounter (Signed)
Copied from CRM [id number]. Topic: Quick Communication - See Telephone Encounter >> May 31, 2018  3:46 PM Luanna Coleawoud, Jessica L wrote: CRM for notification. See Telephone encounter for: 05/31/18. Pt is requesting a prescription for a stair lift because it would help the pt with his taxes. Please advise. [phone]

## 2018-05-31 NOTE — Telephone Encounter (Signed)
That is something that has to wait for his PCP to return  I will route it to him  I will also route to Nicki Reaperegina Baity just in case she is familiar with his case  Thanks

## 2018-06-01 NOTE — Telephone Encounter (Signed)
This may be reasonable for him ---but any durable medical equipment requires a face to face visit for documentation. If he is not submitting it for reimbursement, but just needs a prescription so it is tax deductible, I can probably write the Rx without a visit

## 2018-06-03 NOTE — Telephone Encounter (Signed)
Spoke to pt. He is going through a company. He said they told him he just needed an rx from the PCP to turn in on his taxes for the [employer]. He is aware it will be week before it is ready. He said it just needs his name, DOB, and equipment he is getting.

## 2018-06-04 NOTE — Telephone Encounter (Signed)
Okay to prepare a prescription for the stair lift (get specific information like brand and model, etc) Dx--Parkinsons I will sign on Monday when I am back

## 2018-06-07 NOTE — Telephone Encounter (Signed)
Advised pt's wife rx up front ready for pickup

## 2018-06-07 NOTE — Telephone Encounter (Signed)
Rx written up and waiting for signature.

## 2018-06-23 ENCOUNTER — Telehealth: Payer: Self-pay | Admitting: Internal Medicine

## 2018-06-23 NOTE — Telephone Encounter (Signed)
If he already has a hospital bed, I am not sure what more [facility] can do (though sometimes a trapeze bar on a bed frame will help) I haven't seen him in a while but the neurology notes mention worsening dizziness, etc I think an evaluation here would make more sense first, to review medications and decide what the main issue is with the trouble getting in and out of bed

## 2018-06-23 NOTE — Telephone Encounter (Signed)
Copied from CRM [phone]. Topic: Quick Communication - See Telephone Encounter >> Jun 23, 2018 12:03 PM Lorayne BenderAlexander, Amber L wrote: CRM for notification. See Telephone encounter for: 06/23/18.  Pt's wife, Enid DerryCarole, is calling:  States that pt is having a lot of trouble getting in and out of bed.  They have tried multiple solutions on their own and they even have a hospital bed, but pt is still having trouble.  Pt has reached out to [facility] and they would like to see if the physician will send in for a one time assessment for [facility] to come in to their home to assist them in finding a solution.  Enid DerryCarole can be reached at [phone]

## 2018-06-23 NOTE — Telephone Encounter (Signed)
Spoke to pt's wife. Made appt 07-01-18

## 2018-07-01 ENCOUNTER — Ambulatory Visit: Payer: Medicare Other | Admitting: Internal Medicine

## 2018-07-01 ENCOUNTER — Encounter: Payer: Self-pay | Admitting: Internal Medicine

## 2018-07-01 VITALS — BP 114/52 | HR 70 | Temp 97.7°F | Wt 141.5 lb

## 2018-07-01 DIAGNOSIS — G20 Parkinson's disease: Secondary | ICD-10-CM

## 2018-07-01 DIAGNOSIS — I10 Essential (primary) hypertension: Secondary | ICD-10-CM | POA: Diagnosis not present

## 2018-07-01 DIAGNOSIS — E440 Moderate protein-calorie malnutrition: Secondary | ICD-10-CM | POA: Diagnosis not present

## 2018-07-01 LAB — CBC
HCT: 36.6 % — ABNORMAL LOW (ref 39.0–52.0)
Hemoglobin: 12.5 g/dL — ABNORMAL LOW (ref 13.0–17.0)
MCHC: 34.3 g/dL (ref 30.0–36.0)
MCV: 96.8 fl (ref 78.0–100.0)
PLATELETS: 282 10*3/uL (ref 150.0–400.0)
RBC: 3.78 Mil/uL — ABNORMAL LOW (ref 4.22–5.81)
RDW: 13.3 % (ref 11.5–15.5)
WBC: 8.9 10*3/uL (ref 4.0–10.5)

## 2018-07-01 LAB — COMPREHENSIVE METABOLIC PANEL
ALBUMIN: 3.9 g/dL (ref 3.5–5.2)
ALK PHOS: 54 U/L (ref 39–117)
ALT: 5 U/L (ref 0–53)
AST: 13 U/L (ref 0–37)
BUN: 35 mg/dL — AB (ref 6–23)
CALCIUM: 9.4 mg/dL (ref 8.4–10.5)
CHLORIDE: 92 meq/L — AB (ref 96–112)
CO2: 33 mEq/L — ABNORMAL HIGH (ref 19–32)
CREATININE: 1.2 mg/dL (ref 0.40–1.50)
GFR: 62.34 mL/min (ref >60.00)
Glucose, Bld: 119 mg/dL — ABNORMAL HIGH (ref 70–99)
Potassium: 4.2 mEq/L (ref 3.5–5.1)
SODIUM: 130 meq/L — AB (ref 135–145)
TOTAL PROTEIN: 7.4 g/dL (ref 6.0–8.3)
Total Bilirubin: 0.6 mg/dL (ref 0.2–1.2)

## 2018-07-01 LAB — T4, FREE: FREE T4: 0.81 ng/dL (ref 0.60–1.60)

## 2018-07-01 LAB — SEDIMENTATION RATE: SED RATE: 61 mm/h — AB (ref 0–20)

## 2018-07-01 MED ORDER — MIRTAZAPINE 15 MG PO TABS: 15.0000 mg | ORAL_TABLET | Freq: Every day | ORAL | 3 refills | Status: DC

## 2018-07-01 NOTE — Assessment & Plan Note (Signed)
Marked progression despite recent medication adjustments by neurologist PT/OT at home Don't think DME is going to help any further Needs to check in with Dr Shanda Howellsalakos

## 2018-07-01 NOTE — Progress Notes (Signed)
Subjective:    Patient ID: Johnny Barnett, male    DOB: 03/26/1941, 77 y.o.   MRN: [id number]  HPI Here with wife due to decreased function at home  Has hospital bed but still not able to get in and out of bed himself Wife has trouble helping him---she has bad back Able to sit on side of bed--but trouble with bed mobility to lie down Tried trapeze--but can't use it due to shoulder problems Is able to stand from sitting--- "but not well" Has walker but not using it Uses walker or cane to walk  Reviewed his weight loss At least 10# recently-- last 3 months (and 20# or more over 2-3 years) Notes increasing weakness due to this Feels he is eating well Has tried ensure--- up to one a day (and usually one daily)  Had ropinirole decreased a month ago Sinemet was increased to reduce freezing (off times)  Current Outpatient Medications on File Prior to Visit  Medication Sig Dispense Refill  . acetaminophen (TYLENOL) 500 MG tablet Take 500 mg by mouth every 6 (six) hours as needed.    Marland Kitchen. aspirin 81 MG EC tablet Take 81 mg by mouth daily.      . carbidopa-levodopa (SINEMET IR) 25-100 MG per tablet Take 1.5 tabs at 730, 1130 and 330. Take one tab at 630.    . Carbidopa-Levodopa ER (SINEMET CR) 25-100 MG tablet controlled release Take 2 tablets by mouth at bedtime.    . fluticasone (FLONASE) 50 MCG/ACT nasal spray PLACE 2 SPRAYS INTO EACH NOSTRIL DAILY. 16 g 11  . losartan (COZAAR) 50 MG tablet Take 1 tablet (50 mg total) by mouth daily. 90 tablet 3  . Multiple Vitamin (MULTIVITAMIN) tablet Take 1 tablet by mouth daily.      . polyethylene glycol powder (GLYCOLAX/MIRALAX) powder Take 17 g by mouth daily.    Marland Kitchen. rOPINIRole (REQUIP) 1 MG tablet Take 1 mg by mouth 3 (three) times daily.     Marland Kitchen. senna-docusate (SENOKOT-S) 8.6-50 MG tablet Take by mouth.    . triamcinolone cream (KENALOG) 0.1 % APPLY TWICE A DAY AS NEEDED FOR ITCHY AREAS 30 g 3   No current facility-administered medications on file  prior to visit.     No Known Allergies  Past Medical History:  Diagnosis Date  . Degenerative joint disease   . ED (erectile dysfunction)   . Hyperlipidemia   . Parkinson disease [facility](HCC)     Past Surgical History:  Procedure Laterality Date  . CATARACT EXTRACTION W/ INTRAOCULAR LENS IMPLANT Bilateral   . CYST REMOVAL TRUNK  1/16   Dr Egbert GaribaldiBird  . FLEXIBLE SIGMOIDOSCOPY  08/05  . Sigmoid volvulus resection  10/13   Dr Michela PitcherEly    Family History  Problem Relation Age of Onset  . Diabetes Paternal Uncle   . Coronary artery disease Neg Hx   . Hypertension Neg Hx     Social History   Socioeconomic History  . Marital status: Married    Spouse name: Not on file  . Number of children: 2  . Years of education: Not on file  . Highest education level: Not on file  Occupational History  . Occupation: retired- professor of history at [employer]    Employer: [employer]  Social Needs  . Financial resource strain: Not on file  . Food insecurity:    Worry: Not on file    Inability: Not on file  . Transportation needs:    Medical: Not on file  Non-medical: Not on file  Tobacco Use  . Smoking status: Never Smoker  . Smokeless tobacco: Never Used  Substance and Sexual Activity  . Alcohol use: Yes    Comment: wine  . Drug use: No  . Sexual activity: Not on file  Lifestyle  . Physical activity:    Days per week: Not on file    Minutes per session: Not on file  . Stress: Not on file  Relationships  . Social connections:    Talks on phone: Not on file    Gets together: Not on file    Attends religious service: Not on file    Active member of club or organization: Not on file    Attends meetings of clubs or organizations: Not on file    Relationship status: Not on file  . Intimate partner violence:    Fear of current or ex partner: Not on file    Emotionally abused: Not on file    Physically abused: Not on file    Forced sexual activity: Not on file  Other Topics Concern  .  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, theatre/television/film   Wrote University's history, published 2014      Has living will   Wife, then daughter Trudie Buckler, would be [facility] care POA   Would accept resuscitation attempts   No tube feedings if cognitively unaware         Review of Systems No cough or fever Does have dizziness after eating--has to lie down (so smaller meals) Bowels better controlled now Sleeping for 2 hours at a time--due to nocturia Does tire easily during the day Had been walking until a few weeks ago--now hard even making it to the mailbox No blood in urine or stool No rash No swollen glands BP running low but labile (high at times)    Objective:   Physical Exam  Constitutional: No distress.  Clear wasting and sarcopenia  HENT:  Mouth/Throat: Oropharynx is clear and moist. No oropharyngeal exudate.  Neck: No thyromegaly present.  Cardiovascular: Normal rate, regular rhythm and normal heart sounds. Exam reveals no gallop.  No murmur heard. Respiratory: Effort normal and breath sounds normal. No respiratory distress. He has no wheezes. He has no rales.  GI: Soft. He exhibits no mass. There is no tenderness.  Lymphadenopathy:    He has no cervical adenopathy.  Neurological:  Marked stiffness and bradykinesia Multiple freezing episodes  Psychiatric: He has a normal mood and affect. His behavior is normal.  Typical masked appearance           Assessment & Plan:

## 2018-07-01 NOTE — Assessment & Plan Note (Signed)
BP Readings from Last 3 Encounters:  07/01/18 (!) 114/52  04/08/18 (!) 156/90  08/24/17 132/84   Very labile  Will stop the losartan due to concerns of severe hypotension at times

## 2018-07-01 NOTE — Patient Instructions (Signed)
Stop the losartan. Start the mirtazapine at bedtime Drink a can of boost or ensure inbetween normal meals---at least twice a day.

## 2018-07-01 NOTE — Assessment & Plan Note (Signed)
Recent 10# weight loss on top of prior loss They both say he is eating well though Discussed supplements and will add mirtazapine Early follow up

## 2018-07-03 ENCOUNTER — Encounter: Payer: Self-pay | Admitting: Emergency Medicine

## 2018-07-03 ENCOUNTER — Other Ambulatory Visit: Payer: Self-pay

## 2018-07-03 ENCOUNTER — Emergency Department
Admission: EM | Admit: 2018-07-03 | Discharge: 2018-07-03 | Disposition: A | Discharge status: Home / Self Care | Payer: Medicare Other | Attending: Emergency Medicine | Admitting: Emergency Medicine

## 2018-07-03 DIAGNOSIS — Y92009 Unspecified place in unspecified non-institutional (private) residence as the place of occurrence of the external cause: Secondary | ICD-10-CM

## 2018-07-03 DIAGNOSIS — G20 Parkinson's disease: Secondary | ICD-10-CM | POA: Diagnosis not present

## 2018-07-03 DIAGNOSIS — Y929 Unspecified place or not applicable: Secondary | ICD-10-CM | POA: Insufficient documentation

## 2018-07-03 DIAGNOSIS — Y999 Unspecified external cause status: Secondary | ICD-10-CM | POA: Diagnosis not present

## 2018-07-03 DIAGNOSIS — W01190A Fall on same level from slipping, tripping and stumbling with subsequent striking against furniture, initial encounter: Secondary | ICD-10-CM | POA: Diagnosis not present

## 2018-07-03 DIAGNOSIS — Z79899 Other long term (current) drug therapy: Secondary | ICD-10-CM | POA: Diagnosis not present

## 2018-07-03 DIAGNOSIS — S0101XA Laceration without foreign body of scalp, initial encounter: Secondary | ICD-10-CM | POA: Diagnosis not present

## 2018-07-03 DIAGNOSIS — I10 Essential (primary) hypertension: Secondary | ICD-10-CM | POA: Insufficient documentation

## 2018-07-03 DIAGNOSIS — Z7982 Long term (current) use of aspirin: Secondary | ICD-10-CM | POA: Insufficient documentation

## 2018-07-03 DIAGNOSIS — Y9389 Activity, other specified: Secondary | ICD-10-CM | POA: Diagnosis not present

## 2018-07-03 DIAGNOSIS — W19XXXA Unspecified fall, initial encounter: Secondary | ICD-10-CM

## 2018-07-03 MED ORDER — ACETAMINOPHEN 325 MG PO TABS
650.0000 mg | ORAL_TABLET | Freq: Once | ORAL | Status: AC
  Administered 2018-07-03: 650 mg via ORAL
  Filled 2018-07-03: qty 2

## 2018-07-03 NOTE — Discharge Instructions (Addendum)
Keep the wound clean and dry. Follow-up with Dr. Alphonsus SiasLetvak as needed.

## 2018-07-03 NOTE — ED Triage Notes (Signed)
Lost balance while getting something out of fridge and hit head on cabinet, laceration L scalp. Denies LOC. Denies use of blood thinners except ASA.

## 2018-07-03 NOTE — ED Provider Notes (Signed)
[facility] Emergency Department Provider Note ____________________________________________  Time seen: 2116  I have reviewed the triage vital signs and the nursing notes.  HISTORY  Chief Complaint  Head Laceration  HPI Johnny Barnett is a 77 y.o. male presents to the ED accompanied by his wife, for evaluation of injury sustained following a mechanical fall.  Patient with a history of Parkinson's disease, reports he was pulling a tray of watermelon out of the fridge when he lost his balance.  He describes standing up with the tray and return, he tipped over hitting the top of his head on the cabinet.  He denies any loss of consciousness, nausea, vomiting, or dizziness.  His wife is in the room, so she did not witness the fall.  She was able to get the patient up and she reports he is of his normal level of activity and cognition.  Patient reports generally that his Parkinson's has progressed to include more fatigue over the last month or so.  He is currently under the care of his neurologist for further evaluation.  Patient denies any other injury related to his current fall.  Past Medical History:  Diagnosis Date  . Degenerative joint disease   . ED (erectile dysfunction)   . Hyperlipidemia   . Parkinson disease [facility])     Patient Active Problem List   Diagnosis Date Noted  . Vision abnormalities 01/21/2018  . Malnutrition of moderate degree (HCC) 08/24/2017  . Advance directive discussed with patient 08/15/2015  . Actinic keratosis 12/02/2012  . Routine general medical examination at a health care facility 07/23/2012  . Other constipation 09/09/2011  . Parkinson disease (HCC) 07/07/2011  . HTN (hypertension) 05/30/2011  . Memory loss 10/16/2010  . HYPERLIPIDEMIA 05/17/2007  . ERECTILE DYSFUNCTION 05/17/2007  . DEGENERATIVE JOINT DISEASE 05/17/2007    Past Surgical History:  Procedure Laterality Date  . CATARACT EXTRACTION W/ INTRAOCULAR LENS IMPLANT  Bilateral   . CYST REMOVAL TRUNK  1/16   Dr Egbert Garibaldi  . FLEXIBLE SIGMOIDOSCOPY  08/05  . Sigmoid volvulus resection  10/13   Dr Michela Pitcher    Prior to Admission medications   Medication Sig Start Date End Date Taking? Authorizing Provider  acetaminophen (TYLENOL) 500 MG tablet Take 500 mg by mouth every 6 (six) hours as needed.    [provider]  aspirin 81 MG EC tablet Take 81 mg by mouth daily.      [provider]  carbidopa-levodopa (SINEMET IR) 25-100 MG per tablet Take 1.5 tabs at 730, 1130 and 330. Take one tab at 630. 03/29/15   [provider]  Carbidopa-Levodopa ER (SINEMET CR) 25-100 MG tablet controlled release Take 2 tablets by mouth at bedtime.    [provider]  fluticasone (FLONASE) 50 MCG/ACT nasal spray PLACE 2 SPRAYS INTO EACH NOSTRIL DAILY. 08/24/17   Karie Schwalbe, MD  mirtazapine (REMERON) 15 MG tablet Take 1 tablet (15 mg total) by mouth at bedtime. 07/01/18   Karie Schwalbe, MD  Multiple Vitamin (MULTIVITAMIN) tablet Take 1 tablet by mouth daily.      [provider]  polyethylene glycol powder (GLYCOLAX/MIRALAX) powder Take 17 g by mouth daily.    [provider]  rOPINIRole (REQUIP) 1 MG tablet Take 1 mg by mouth 3 (three) times daily.  12/28/14   [provider]  senna-docusate (SENOKOT-S) 8.6-50 MG tablet Take by mouth.    [provider]  triamcinolone cream (KENALOG) 0.1 % APPLY TWICE A DAY  AS NEEDED FOR ITCHY AREAS 08/24/17   Karie SchwalbeLetvak, Richard I, MD    Allergies Patient has no known allergies.  Family History  Problem Relation Age of Onset  . Diabetes Paternal Uncle   . Coronary artery disease Neg Hx   . Hypertension Neg Hx     Social History Social History   Tobacco Use  . Smoking status: Never Smoker  . Smokeless tobacco: Never Used  Substance Use Topics  . Alcohol use: Yes    Comment: wine  . Drug use: No    Review of Systems  Constitutional: Negative for fever. Eyes:  Negative for visual changes. ENT: Negative for sore throat. Cardiovascular: Negative for chest pain. Respiratory: Negative for shortness of breath. Gastrointestinal: Negative for abdominal pain, vomiting and diarrhea. Genitourinary: Negative for dysuria. Musculoskeletal: Negative for back pain. Skin: Negative for rash.  Scalp laceration as above. Neurological: Negative for headaches, focal weakness or numbness. ____________________________________________  PHYSICAL EXAM:  VITAL SIGNS: ED Triage Vitals [07/03/18 2028]  Enc Vitals Group     BP (!) 185/83     Pulse Rate 77     Resp 16     Temp 98.2 F (36.8 C)     Temp Source Oral     SpO2 99 %     Weight 143 lb (64.9 kg)     Height 5\' 8"  (1.727 m)     Head Circumference      Peak Flow      Pain Score 4     Pain Loc      Pain Edu?      Excl. in GC?     Constitutional: Alert and oriented. Well appearing and in no distress. Head: Normocephalic and atraumatic, except for a superficial laceration to the left parietal scalp measuring about 1.5 cm.  No active bleeding is appreciated. Eyes: Conjunctivae are normal. PERRL. Normal extraocular movements Ears: Canals clear. TMs intact bilaterally. Nose: No congestion/rhinorrhea/epistaxis. Cardiovascular: Normal rate, regular rhythm. Normal distal pulses. Respiratory: Normal respiratory effort. No wheezes/rales/rhonchi. Gastrointestinal: Soft and nontender. No distention. Musculoskeletal: Nontender with normal range of motion in all extremities.  Neurologic: Normal speech and language. No gross focal neurologic deficits are appreciated. Skin:  Skin is warm, dry and intact. No rash noted. Psychiatric: Mood and affect are normal. Patient exhibits appropriate insight and judgment. ____________________________________________  PROCEDURES  .Marland Kitchen.Laceration Repair Date/Time: 07/03/2018 9:59 PM Performed by: Lissa HoardMenshew, Kathleena Freeman V Bacon, PA-C Authorized by: Lissa HoardMenshew, Avary Eichenberger V Bacon, PA-C    Consent:    Consent obtained:  Verbal   Consent given by:  Patient   Risks discussed:  Poor cosmetic result   Alternatives discussed:  No treatment Anesthesia (see MAR for exact dosages):    Anesthesia method:  None Laceration details:    Location:  Scalp   Scalp location:  L parietal   Length (cm):  1.5 Repair type:    Repair type:  Simple Treatment:    Area cleansed with:  Saline Skin repair:    Repair method:  Tissue adhesive Approximation:    Approximation:  Close Post-procedure details:    Dressing:  Open (no dressing)   Patient tolerance of procedure:  Tolerated well, no immediate complications  ____________________________________________  INITIAL IMPRESSION / ASSESSMENT AND PLAN / ED COURSE  Patient with Parkinson's dementia presents accompanied by his wife, for a superficial laceration to the scalp after mechanical fall.  Patient exam is overall benign and no acute neuromuscular deficits are appreciated.  Patient scalp wound is repaired using wound  adhesive.  He is likely discharged with wound care instructions.  They will follow-up with the primary provider return to the ED as necessary. ____________________________________________  FINAL CLINICAL IMPRESSION(S) / ED DIAGNOSES  Final diagnoses:  Laceration of scalp, initial encounter  Fall at home, initial encounter      Lissa Hoard, PA-C 07/03/18 2200    Minna Antis, MD 07/03/18 2252

## 2018-07-03 NOTE — ED Triage Notes (Signed)
First Nurse Note:  Arrives with C/O fall, and hit head on a cabinet.  No LOC.  Small laceration to left head.  Bleeding controlled.Marland Kitchen. NAD

## 2018-07-06 ENCOUNTER — Telehealth: Payer: Self-pay | Admitting: Internal Medicine

## 2018-07-06 NOTE — Telephone Encounter (Signed)
Copied from CRM [id number]. Topic: General - Other >> Jul 06, 2018  3:55 PM Maia Pettiesrtiz, Kristie S wrote: Requesting VO for PT 2x week for 6 weeks Ok to leave detailed msg on secure VM

## 2018-07-07 NOTE — Telephone Encounter (Signed)
Verbal orders given to Lynn at [facility] 

## 2018-07-07 NOTE — Telephone Encounter (Signed)
That is fine 

## 2018-07-08 ENCOUNTER — Telehealth: Payer: Self-pay

## 2018-07-08 NOTE — Telephone Encounter (Signed)
Spoke to pt. He said they used Dermabond to glue the laceration. He noticed some blood coming from it yesterday. Wondered if he should be concerned with that.

## 2018-07-08 NOTE — Telephone Encounter (Signed)
Probably not a concern if it still seems to be holding together. Make sure he tries to limit any pressure on the wound

## 2018-07-08 NOTE — Telephone Encounter (Signed)
Spoke to pt. He said he will keep a watch on it. It is still held together well.

## 2018-07-09 ENCOUNTER — Telehealth: Payer: Self-pay | Admitting: *Deleted

## 2018-07-09 NOTE — Telephone Encounter (Signed)
Copied from CRM [id number]. Topic: Inquiry >> Jul 09, 2018 12:17 PM Johnny Barnett, Johnny Barnett: Reason for CRM: [facility] to state: 1. Pt fell @ his mailbox on Wednesday around 5pm; skin tear on left hand, no medical attention needed 2. Verbals needed for OT  Contact: [phone]

## 2018-07-09 NOTE — Telephone Encounter (Signed)
Sorry to hear he fell [place]

## 2018-07-09 NOTE — Telephone Encounter (Signed)
Left verbal authorization on VM for OT

## 2018-07-14 DIAGNOSIS — M1990 Unspecified osteoarthritis, unspecified site: Secondary | ICD-10-CM | POA: Diagnosis not present

## 2018-07-14 DIAGNOSIS — Z7982 Long term (current) use of aspirin: Secondary | ICD-10-CM

## 2018-07-14 DIAGNOSIS — I10 Essential (primary) hypertension: Secondary | ICD-10-CM | POA: Diagnosis not present

## 2018-07-14 DIAGNOSIS — R413 Other amnesia: Secondary | ICD-10-CM

## 2018-07-14 DIAGNOSIS — E440 Moderate protein-calorie malnutrition: Secondary | ICD-10-CM | POA: Diagnosis not present

## 2018-07-14 DIAGNOSIS — K5909 Other constipation: Secondary | ICD-10-CM

## 2018-07-14 DIAGNOSIS — Z79899 Other long term (current) drug therapy: Secondary | ICD-10-CM

## 2018-07-14 DIAGNOSIS — Z9181 History of falling: Secondary | ICD-10-CM

## 2018-07-14 DIAGNOSIS — I951 Orthostatic hypotension: Secondary | ICD-10-CM | POA: Insufficient documentation

## 2018-07-14 DIAGNOSIS — G20 Parkinson's disease: Secondary | ICD-10-CM | POA: Diagnosis not present

## 2018-07-19 ENCOUNTER — Telehealth: Payer: Self-pay

## 2018-07-19 NOTE — Telephone Encounter (Signed)
Verbal orders given to Lynn 

## 2018-07-19 NOTE — Telephone Encounter (Signed)
Copied from CRM [id number]. Topic: Quick Communication - See Telephone Encounter >> Jul 19, 2018  8:57 AM Herby Abraham C wrote: CRM for notification. See Telephone encounter for: 07/19/18.   Request speech therapy eval due to speech swelling difficulty and speech difficulty .  CB: [phone] Larita Fife - PT w/ [facility]

## 2018-07-19 NOTE — Telephone Encounter (Signed)
That is appropriate 

## 2018-07-23 ENCOUNTER — Telehealth: Payer: Self-pay | Admitting: Internal Medicine

## 2018-07-23 NOTE — Telephone Encounter (Signed)
Spoke to CIT GroupEsther

## 2018-07-23 NOTE — Telephone Encounter (Signed)
Copied from CRM [id number]. Topic: General - Other >> Jul 23, 2018  1:33 PM Gerrianne ScalePayne, Jeric Slagel L wrote: Reason for CRM: OT Ester from [facility] [phone] calling to move pt last two visits to the week of the 23rd due to her going out of town for Occupational Therapy

## 2018-07-23 NOTE — Telephone Encounter (Signed)
That is fine 

## 2018-08-02 ENCOUNTER — Telehealth: Payer: Self-pay | Admitting: Internal Medicine

## 2018-08-02 NOTE — Telephone Encounter (Signed)
Copied from CRM [id number]. Topic: General - Other >> Aug 02, 2018  4:03 PM Stephannie LiSimmons, Anjelique Makar L, NT wrote: Reason for CRM: Larita FifeLynn from [facility] called and said the patient fell 2 days ago and also , he has decided to not take mirtazapine, it was making him too drowsy and  dizzy , he has also developed a a cough with yellow sputum , and she would like someone to contact the patient or his wife and also Larita FifeLynn is not currently with the patient

## 2018-08-02 NOTE — Telephone Encounter (Signed)
I spoke with pt; pt started prod cough with yellow phlegm last 2 -3 days; had some cough for couple weeks. No wheezing, SOB or fever. Pt does not feel like getting phlegm up when coughs; hurts waistline area on rt side when coughs. Pt would prefer Dr Karle StarchLetvak's advice; sending note to Dr Alphonsus SiasLetvak who will return to office on 08/03/18 in AM. Mr Bellisario request cb after Dr Alphonsus SiasLetvak reviews. [place] [facility].

## 2018-08-03 NOTE — Telephone Encounter (Signed)
If his cough is tight and he feels like he needs to get up mucus, he can try guaifenisin OTC to loosen cough. Sounds like bronchitis---usually viral and will go away on it own.  If he has fever or significant SOB, will need to consider pneumonia and he should be seen

## 2018-08-03 NOTE — Telephone Encounter (Signed)
Spoke to pt's wife per DPR. They are using the guaifenesin. He does not have a fever or SOB. They will let us know if anything changes and he needs to be seen.

## 2018-08-05 ENCOUNTER — Encounter: Payer: Self-pay | Admitting: Podiatry

## 2018-08-05 ENCOUNTER — Ambulatory Visit (INDEPENDENT_AMBULATORY_CARE_PROVIDER_SITE_OTHER): Payer: Medicare Other | Admitting: Podiatry

## 2018-08-05 ENCOUNTER — Telehealth: Payer: Self-pay | Admitting: Internal Medicine

## 2018-08-05 DIAGNOSIS — M79609 Pain in unspecified limb: Secondary | ICD-10-CM | POA: Diagnosis not present

## 2018-08-05 DIAGNOSIS — B351 Tinea unguium: Secondary | ICD-10-CM | POA: Diagnosis not present

## 2018-08-05 NOTE — Telephone Encounter (Signed)
Copied from CRM [id number]. Topic: General - Other >> Aug 05, 2018  1:39 PM Maia Petties wrote: Reason for CRM: Requesting VO for 1 additional OT visit. Secure VM ok to leave detailed msg

## 2018-08-05 NOTE — Progress Notes (Signed)
Complaint:  Visit Type: Patient returns to my office for continued preventative foot care services. Complaint: Patient states" my nails have grown long and thick and become painful to walk and wear shoes" . The patient presents for preventative foot care services. No changes to ROS  Podiatric Exam: Vascular: dorsalis pedis and posterior tibial pulses are palpable bilateral. Capillary return is immediate. Temperature gradient is WNL. Skin turgor WNL  Sensorium: Normal Semmes Weinstein monofilament test. Normal tactile sensation bilaterally. Nail Exam: Pt has thick disfigured discolored nails with subungual debris noted bilateral entire nail hallux through fifth toenails Ulcer Exam: There is no evidence of ulcer or pre-ulcerative changes or infection. Orthopedic Exam: Muscle tone and strength are WNL. No limitations in general ROM. No crepitus or effusions noted. Foot type and digits show no abnormalities. Bony prominences are unremarkable. Skin: No Porokeratosis. No infection or ulcers  Diagnosis:  Onychomycosis, , Pain in right toe, pain in left toes  Treatment & Plan Procedures and Treatment: Consent by patient was obtained for treatment procedures. The patient understood the discussion of treatment and procedures well. All questions were answered thoroughly reviewed. Debridement of mycotic and hypertrophic toenails, 1 through 5 bilateral and clearing of subungual debris. No ulceration, no infection noted.  Return Visit-Office Procedure: Patient instructed to return to the office for a follow up visit 10 weeks  for continued evaluation and treatment.    Zelig Gacek DPM 

## 2018-08-05 NOTE — Telephone Encounter (Signed)
That is fine 

## 2018-08-05 NOTE — Telephone Encounter (Signed)
Verbal order given to [name] 

## 2018-08-09 ENCOUNTER — Ambulatory Visit: Payer: Medicare Other | Admitting: Internal Medicine

## 2018-08-09 ENCOUNTER — Encounter: Payer: Self-pay | Admitting: Internal Medicine

## 2018-08-09 VITALS — BP 110/62 | HR 60 | Temp 97.5°F | Ht 69.0 in | Wt 141.5 lb

## 2018-08-09 DIAGNOSIS — G20 Parkinson's disease: Secondary | ICD-10-CM | POA: Diagnosis not present

## 2018-08-09 DIAGNOSIS — Z23 Encounter for immunization: Secondary | ICD-10-CM | POA: Diagnosis not present

## 2018-08-09 DIAGNOSIS — E440 Moderate protein-calorie malnutrition: Secondary | ICD-10-CM | POA: Diagnosis not present

## 2018-08-09 NOTE — Assessment & Plan Note (Signed)
Mild improvement with home therapy---both OT and PT are winding up Discussed the need for home exercise to prevent ongoing decline

## 2018-08-09 NOTE — Assessment & Plan Note (Signed)
Clear wasting on exam and sarcopenia Is trying high calorie/high protein snacks but didn't gain weight They will try the mirtazapine again--but 7.5mg  instead

## 2018-08-09 NOTE — Addendum Note (Signed)
Addended by: Eual Fines on: 08/09/2018 02:19 PM   Modules accepted: Orders

## 2018-08-09 NOTE — Patient Instructions (Signed)
Please try the mirtazapine at night again--but only 1/2 tab (7.5mg ).

## 2018-08-09 NOTE — Progress Notes (Signed)
Subjective:    Patient ID: Johnny Barnett, male    DOB: [birth year], 77 y.o.   MRN: [id number]  HPI Here with wife for follow up  Has been taking 1 ensure a day--can't tolerated 2 High calorie snacks--- velveeta breakfast cookie (high protein) Took the mirtazapine---got "swimmy headed". Not sure if it did anything for appetite Was dangerously sedated when getting up to void at night  Went off sinement CR at night briefly This didn't help at all  Has made some progress with the home PT/OT Less "trauma" with transfers now They recommended urinal at night instead of going to BR. This has helped They use diaper as well Now having some overnight care with aides  Current Outpatient Medications on File Prior to Visit  Medication Sig Dispense Refill  . acetaminophen (TYLENOL) 500 MG tablet Take 500 mg by mouth every 6 (six) hours as needed.    Marland Kitchen aspirin 81 MG EC tablet Take 81 mg by mouth daily.      . carbidopa-levodopa (SINEMET IR) 25-100 MG per tablet Take 1.5 tabs at 730, 1130 and 330. Take one tab at 630.    . Carbidopa-Levodopa ER (SINEMET CR) 25-100 MG tablet controlled release Take 2 tablets by mouth at bedtime.    . fluticasone (FLONASE) 50 MCG/ACT nasal spray PLACE 2 SPRAYS INTO EACH NOSTRIL DAILY. 16 g 11  . Multiple Vitamin (MULTIVITAMIN) tablet Take 1 tablet by mouth daily.      . polyethylene glycol powder (GLYCOLAX/MIRALAX) powder Take 17 g by mouth daily.    Marland Kitchen rOPINIRole (REQUIP) 1 MG tablet Take 1 mg by mouth 3 (three) times daily.     Marland Kitchen senna-docusate (SENOKOT-S) 8.6-50 MG tablet Take by mouth.    . triamcinolone cream (KENALOG) 0.1 % APPLY TWICE A DAY AS NEEDED FOR ITCHY AREAS 30 g 3   No current facility-administered medications on file prior to visit.     No Known Allergies  Past Medical History:  Diagnosis Date  . Degenerative joint disease   . ED (erectile dysfunction)   . Hyperlipidemia   . Parkinson disease [facility])     Past Surgical History:  Procedure  Laterality Date  . CATARACT EXTRACTION W/ INTRAOCULAR LENS IMPLANT Bilateral   . CYST REMOVAL TRUNK  1/16   Dr Egbert Garibaldi  . FLEXIBLE SIGMOIDOSCOPY  08/05  . Sigmoid volvulus resection  10/13   Dr Michela Pitcher    Family History  Problem Relation Age of Onset  . Diabetes Paternal Uncle   . Coronary artery disease Neg Hx   . Hypertension Neg Hx     Social History   Socioeconomic History  . Marital status: Married    Spouse name: Not on file  . Number of children: 2  . Years of education: Not on file  . Highest education level: Not on file  Occupational History  . Occupation: retired- professor of history at [employer]    Employer: [employer]  Social Needs  . Financial resource strain: Not on file  . Food insecurity:    Worry: Not on file    Inability: Not on file  . Transportation needs:    Medical: Not on file    Non-medical: Not on file  Tobacco Use  . Smoking status: Never Smoker  . Smokeless tobacco: Never Used  Substance and Sexual Activity  . Alcohol use: Yes    Comment: wine  . Drug use: No  . Sexual activity: Not on file  Lifestyle  . Physical  activity:    Days per week: Not on file    Minutes per session: Not on file  . Stress: Not on file  Relationships  . Social connections:    Talks on phone: Not on file    Gets together: Not on file    Attends religious service: Not on file    Active member of club or organization: Not on file    Attends meetings of clubs or organizations: Not on file    Relationship status: Not on file  . Intimate partner violence:    Fear of current or ex partner: Not on file    Emotionally abused: Not on file    Physically abused: Not on file    Forced sexual activity: Not on file  Other Topics Concern  . 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  Neurological:  Bradykinesia and masked facies  Psychiatric: He has a normal mood and affect. His behavior is normal.           Assessment & Plan:

## 2018-08-18 ENCOUNTER — Telehealth: Payer: Self-pay | Admitting: Internal Medicine

## 2018-08-18 NOTE — Telephone Encounter (Signed)
Copied from CRM [id number]. Topic: Quick Communication - See Telephone Encounter >> Aug 18, 2018  1:52 PM Arlyss Gandy, NT wrote: CRM for notification. See Telephone encounter for: 08/18/18. Pt wife would like to know if this pt can take melatonin along with the sleeping medicine he is currently on? Wife does not know the name of the sleeping medicine her husband is on. Requesting a nurse to call to discuss.

## 2018-08-18 NOTE — Telephone Encounter (Signed)
Yes--he can try melatonin with his current medication I would recommend starting with 1mg ---and increasing slowly to 2 or 3mg 

## 2018-08-18 NOTE — Telephone Encounter (Signed)
Spoke to pt's wife. Added melatonin to his med list.

## 2018-08-24 ENCOUNTER — Encounter: Payer: Self-pay | Admitting: Emergency Medicine

## 2018-08-24 ENCOUNTER — Emergency Department: Payer: Medicare Other

## 2018-08-24 ENCOUNTER — Inpatient Hospital Stay
Admission: EM | Admit: 2018-08-24 | Discharge: 2018-08-31 | DRG: 177 | Disposition: A | Discharge status: Hospice | Payer: Medicare Other | Attending: Internal Medicine | Admitting: Internal Medicine

## 2018-08-24 DIAGNOSIS — Z7982 Long term (current) use of aspirin: Secondary | ICD-10-CM

## 2018-08-24 DIAGNOSIS — Z66 Do not resuscitate: Secondary | ICD-10-CM | POA: Diagnosis present

## 2018-08-24 DIAGNOSIS — R05 Cough: Secondary | ICD-10-CM

## 2018-08-24 DIAGNOSIS — G9349 Other encephalopathy: Secondary | ICD-10-CM | POA: Diagnosis not present

## 2018-08-24 DIAGNOSIS — I10 Essential (primary) hypertension: Secondary | ICD-10-CM | POA: Diagnosis present

## 2018-08-24 DIAGNOSIS — Z7189 Other specified counseling: Secondary | ICD-10-CM

## 2018-08-24 DIAGNOSIS — R634 Abnormal weight loss: Secondary | ICD-10-CM | POA: Diagnosis present

## 2018-08-24 DIAGNOSIS — I2510 Atherosclerotic heart disease of native coronary artery without angina pectoris: Secondary | ICD-10-CM | POA: Diagnosis present

## 2018-08-24 DIAGNOSIS — J189 Pneumonia, unspecified organism: Secondary | ICD-10-CM

## 2018-08-24 DIAGNOSIS — E785 Hyperlipidemia, unspecified: Secondary | ICD-10-CM | POA: Diagnosis present

## 2018-08-24 DIAGNOSIS — J851 Abscess of lung with pneumonia: Principal | ICD-10-CM | POA: Diagnosis present

## 2018-08-24 DIAGNOSIS — Z7401 Bed confinement status: Secondary | ICD-10-CM

## 2018-08-24 DIAGNOSIS — E860 Dehydration: Secondary | ICD-10-CM | POA: Diagnosis present

## 2018-08-24 DIAGNOSIS — E876 Hypokalemia: Secondary | ICD-10-CM | POA: Diagnosis present

## 2018-08-24 DIAGNOSIS — J9601 Acute respiratory failure with hypoxia: Secondary | ICD-10-CM | POA: Diagnosis present

## 2018-08-24 DIAGNOSIS — M1990 Unspecified osteoarthritis, unspecified site: Secondary | ICD-10-CM | POA: Diagnosis present

## 2018-08-24 DIAGNOSIS — J869 Pyothorax without fistula: Secondary | ICD-10-CM

## 2018-08-24 DIAGNOSIS — Z515 Encounter for palliative care: Secondary | ICD-10-CM | POA: Diagnosis not present

## 2018-08-24 DIAGNOSIS — R443 Hallucinations, unspecified: Secondary | ICD-10-CM | POA: Diagnosis present

## 2018-08-24 DIAGNOSIS — L8990 Pressure ulcer of unspecified site, unspecified stage: Secondary | ICD-10-CM

## 2018-08-24 DIAGNOSIS — R059 Cough, unspecified: Secondary | ICD-10-CM

## 2018-08-24 DIAGNOSIS — Z7951 Long term (current) use of inhaled steroids: Secondary | ICD-10-CM

## 2018-08-24 DIAGNOSIS — G20 Parkinson's disease: Secondary | ICD-10-CM | POA: Diagnosis present

## 2018-08-24 DIAGNOSIS — R1310 Dysphagia, unspecified: Secondary | ICD-10-CM | POA: Diagnosis present

## 2018-08-24 DIAGNOSIS — R410 Disorientation, unspecified: Secondary | ICD-10-CM | POA: Diagnosis present

## 2018-08-24 DIAGNOSIS — R531 Weakness: Secondary | ICD-10-CM

## 2018-08-24 DIAGNOSIS — J90 Pleural effusion, not elsewhere classified: Secondary | ICD-10-CM

## 2018-08-24 DIAGNOSIS — J918 Pleural effusion in other conditions classified elsewhere: Secondary | ICD-10-CM | POA: Diagnosis present

## 2018-08-24 HISTORY — DX: Essential (primary) hypertension: I10

## 2018-08-24 LAB — CBC
HEMATOCRIT: 31.7 % — AB (ref 39.0–52.0)
HEMOGLOBIN: 10.6 g/dL — AB (ref 13.0–17.0)
MCH: 32.5 pg (ref 26.0–34.0)
MCHC: 33.4 g/dL (ref 30.0–36.0)
MCV: 97.2 fL (ref 80.0–100.0)
NRBC: 0 % (ref 0.0–0.2)
PLATELETS: 326 10*3/uL (ref 150–400)
RBC: 3.26 MIL/uL — AB (ref 4.22–5.81)
RDW: 12.6 % (ref 11.5–15.5)
WBC: 11.2 10*3/uL — ABNORMAL HIGH (ref 4.0–10.5)

## 2018-08-24 LAB — BASIC METABOLIC PANEL
ANION GAP: 8 (ref 5–15)
BUN: 35 mg/dL — ABNORMAL HIGH (ref 8–23)
CALCIUM: 8.1 mg/dL — AB (ref 8.9–10.3)
CHLORIDE: 96 mmol/L — AB (ref 98–111)
CO2: 27 mmol/L (ref 22–32)
Creatinine, Ser: 0.8 mg/dL (ref 0.61–1.24)
GFR calc non Af Amer: >60 mL/min (ref >60)
Glucose, Bld: 117 mg/dL — ABNORMAL HIGH (ref 70–99)
POTASSIUM: 3.3 mmol/L — AB (ref 3.5–5.1)
Sodium: 131 mmol/L — ABNORMAL LOW (ref 135–145)

## 2018-08-24 LAB — URINALYSIS, COMPLETE (UACMP) WITH MICROSCOPIC
BILIRUBIN URINE: NEGATIVE
Bacteria, UA: NONE SEEN
Glucose, UA: NEGATIVE mg/dL
KETONES UR: 5 mg/dL — AB
LEUKOCYTES UA: NEGATIVE
Nitrite: NEGATIVE
PH: 5 (ref 5.0–8.0)
Protein, ur: 100 mg/dL — AB
Specific Gravity, Urine: 1.029 (ref 1.005–1.030)

## 2018-08-24 LAB — LACTIC ACID, PLASMA: LACTIC ACID, VENOUS: 0.8 mmol/L (ref 0.5–1.9)

## 2018-08-24 LAB — TROPONIN I: Troponin I: 0.03 ng/mL (ref <0.03)

## 2018-08-24 MED ORDER — SODIUM CHLORIDE 0.9 % IV SOLN
500.0000 mg | Freq: Once | INTRAVENOUS | Status: AC
  Administered 2018-08-24: 500 mg via INTRAVENOUS
  Filled 2018-08-24: qty 500

## 2018-08-24 MED ORDER — SODIUM CHLORIDE 0.9 % IV SOLN
1.0000 g | Freq: Once | INTRAVENOUS | Status: AC
  Administered 2018-08-24: 1 g via INTRAVENOUS
  Filled 2018-08-24: qty 10

## 2018-08-24 MED ORDER — SODIUM CHLORIDE 0.9 % IV BOLUS
1000.0000 mL | Freq: Once | INTRAVENOUS | Status: AC
  Administered 2018-08-24: 1000 mL via INTRAVENOUS

## 2018-08-24 NOTE — ED Provider Notes (Signed)
[facility] Emergency Department Provider Note  ____________________________________________  Time seen: Approximately 11:17 PM  I have reviewed the triage vital signs and the nursing notes.   HISTORY  Chief Complaint Weakness    HPI Johnny Barnett is a 77 y.o. male with a history of hyperlipidemia and hypertension and Parkinson's disease who complains of increasing generalized weakness.  This is been present for the past few months, gradually worsening, but over the past week has become much more pronounced.  No falls or trauma.  He reports fever today that he measured to at 102 at home.  He also has nausea, shortness of breath and productive cough.  Denies hemoptysis, hematemesis, or black or bloody stool.  Denies dysuria frequency urgency.  He does have decreased urine output over the past few days and poor oral intake.  He reports a 40 pound weight loss over the last 6 months, unintentional.      Past Medical History:  Diagnosis Date  . Degenerative joint disease   . ED (erectile dysfunction)   . Hyperlipidemia   . Parkinson disease [facility])      Patient Active Problem List   Diagnosis Date Noted  . Orthostasis 07/14/2018  . Vision abnormalities 01/21/2018  . Malnutrition of moderate degree (HCC) 08/24/2017  . Advance directive discussed with patient 08/15/2015  . Actinic keratosis 12/02/2012  . Routine general medical examination at a health care facility 07/23/2012  . Other constipation 09/09/2011  . Parkinson disease (HCC) 07/07/2011  . HTN (hypertension) 05/30/2011  . Memory loss 10/16/2010  . HYPERLIPIDEMIA 05/17/2007  . ERECTILE DYSFUNCTION 05/17/2007  . DEGENERATIVE JOINT DISEASE 05/17/2007     Past Surgical History:  Procedure Laterality Date  . CATARACT EXTRACTION W/ INTRAOCULAR LENS IMPLANT Bilateral   . CYST REMOVAL TRUNK  1/16   Dr Egbert Garibaldi  . FLEXIBLE SIGMOIDOSCOPY  08/05  . Sigmoid volvulus resection  10/13   Dr Michela Pitcher      Prior to Admission medications   Medication Sig Start Date End Date Taking? Authorizing Provider  acetaminophen (TYLENOL) 500 MG tablet Take 500 mg by mouth every 6 (six) hours as needed.    [provider]  aspirin 81 MG EC tablet Take 81 mg by mouth daily.      [provider]  carbidopa-levodopa (SINEMET IR) 25-100 MG per tablet Take 1.5 tabs at 730, 1130 and 330. Take one tab at 630. 03/29/15   [provider]  Carbidopa-Levodopa ER (SINEMET CR) 25-100 MG tablet controlled release Take 2 tablets by mouth at bedtime.    [provider]  fluticasone (FLONASE) 50 MCG/ACT nasal spray PLACE 2 SPRAYS INTO EACH NOSTRIL DAILY. 08/24/17   Karie Schwalbe, MD  Melatonin 1 MG TABS Take 1 tablet by mouth at bedtime as needed.    [provider]  Multiple Vitamin (MULTIVITAMIN) tablet Take 1 tablet by mouth daily.      [provider]  polyethylene glycol powder (GLYCOLAX/MIRALAX) powder Take 17 g by mouth daily.    [provider]  rOPINIRole (REQUIP) 1 MG tablet Take 1 mg by mouth 3 (three) times daily.  12/28/14   [provider]  senna-docusate (SENOKOT-S) 8.6-50 MG tablet Take by mouth.    [provider]  triamcinolone cream (KENALOG) 0.1 % APPLY TWICE A DAY AS NEEDED FOR ITCHY AREAS 08/24/17   Karie Schwalbe, MD     Allergies Patient has no known allergies.   Family History  Problem Relation Age of  Onset  . Diabetes Paternal Uncle   . Coronary artery disease Neg Hx   . Hypertension Neg Hx     Social History Social History   Tobacco Use  . Smoking status: Never Smoker  . Smokeless tobacco: Never Used  Substance Use Topics  . Alcohol use: Yes    Comment: wine  . Drug use: No    Review of Systems  Constitutional: Positive fever without chills .  ENT:   No sore throat. No rhinorrhea. Cardiovascular:   No chest pain or syncope. Respiratory: Positive shortness of breath and productive  cough. Gastrointestinal:   Negative for abdominal pain, vomiting and diarrhea.  Musculoskeletal:   Negative for focal pain or swelling All other systems reviewed and are negative except as documented above in ROS and HPI.  ____________________________________________   PHYSICAL EXAM:  VITAL SIGNS: ED Triage Vitals  Enc Vitals Group     BP 08/24/18 1854 (!) 174/85     Pulse Rate 08/24/18 1854 89     Resp 08/24/18 1854 (!) 21     Temp 08/24/18 1854 99.8 F (37.7 C)     Temp Source 08/24/18 1854 Oral     SpO2 08/24/18 1854 95 %     Weight 08/24/18 1855 140 lb (63.5 kg)     Height 08/24/18 1855 5\' 9"  (1.753 m)     Head Circumference --      Peak Flow --      Pain Score 08/24/18 1846 0     Pain Loc --      Pain Edu? --      Excl. in GC? --     Vital signs reviewed, nursing assessments reviewed.   Constitutional:   Alert and oriented.  Ill-appearing . Eyes:   Conjunctivae are normal. EOMI. PERRL. ENT      Head:   Normocephalic and atraumatic.      Nose:   No congestion/rhinnorhea.       Mouth/Throat:   Dry mucous membranes, no pharyngeal erythema. No peritonsillar mass.       Neck:   No meningismus. Full ROM. Hematological/Lymphatic/Immunilogical:   No cervical lymphadenopathy. Cardiovascular:   RRR. Symmetric bilateral radial and DP pulses.  No murmurs. Cap refill less than 2 seconds. Respiratory:   Mild tachypnea. Breath sounds are clear and equal bilaterally. No wheezes/rales/rhonchi. Gastrointestinal:   Soft and nontender. Non distended. There is no CVA tenderness.  No rebound, rigidity, or guarding. Musculoskeletal:   Normal range of motion in all extremities. No joint effusions.  No lower extremity tenderness.  No edema. Neurologic:   Normal speech and language.  Motor grossly intact. No acute focal neurologic deficits are appreciated.  Skin:    Skin is warm, dry and intact. No rash noted.  No petechiae, purpura, or  bullae.  ____________________________________________    LABS (pertinent positives/negatives) (all labs ordered are listed, but only abnormal results are displayed) Labs Reviewed  BASIC METABOLIC PANEL - Abnormal; Notable for the following components:      Result Value   Sodium 131 (*)    Potassium 3.3 (*)    Chloride 96 (*)    Glucose, Bld 117 (*)    BUN 35 (*)    Calcium 8.1 (*)    All other components within normal limits  CBC - Abnormal; Notable for the following components:   WBC 11.2 (*)    RBC 3.26 (*)    Hemoglobin 10.6 (*)    HCT 31.7 (*)    All  other components within normal limits  URINALYSIS, COMPLETE (UACMP) WITH MICROSCOPIC - Abnormal; Notable for the following components:   Color, Urine YELLOW (*)    APPearance CLEAR (*)    Hgb urine dipstick SMALL (*)    Ketones, ur 5 (*)    Protein, ur 100 (*)    All other components within normal limits  TROPONIN I - Abnormal; Notable for the following components:   Troponin I 0.03 (*)    All other components within normal limits  CULTURE, BLOOD (ROUTINE X 2)  CULTURE, BLOOD (ROUTINE X 2)  LACTIC ACID, PLASMA  LACTIC ACID, PLASMA  CBG MONITORING, ED   ____________________________________________   EKG  Interpreted by me Sinus rhythm rate of 89, normal axis intervals QRS ST segments and T waves.  ____________________________________________    RADIOLOGY  Dg Chest Portable 1 View  Result Date: 08/24/2018 CLINICAL DATA:  Increased weakness for last few months significantly worsened since lunch today, history Parkinson's EXAM: PORTABLE CHEST 1 VIEW COMPARISON:  Portable exam 1935 hours compared to 08/02/2012 FINDINGS: Enlargement of cardiac silhouette. Atherosclerotic calcification aorta. Mediastinal contours and pulmonary vascularity normal. Moderate to large RIGHT pleural effusion and atelectasis of the lower RIGHT lung. Underlying infiltrate and/or tumor not excluded in this setting. LEFT lung clear. Bones  demineralized. No pneumothorax. IMPRESSION: New moderate to large RIGHT pleural effusion and significant lower RIGHT lung atelectasis, cannot exclude underlying abnormalities. Electronically Signed   By: Ulyses Southward M.D.   On: 08/24/2018 20:29    ____________________________________________   PROCEDURES Procedures  ____________________________________________  DIFFERENTIAL DIAGNOSIS   UTI, pneumonia, dehydration, likely disturbance, severe malnutrition  CLINICAL IMPRESSION / ASSESSMENT AND PLAN / ED COURSE  Pertinent labs & imaging results that were available during my care of the patient were reviewed by me and considered in my medical decision making (see chart for details).    Patient presents with progressively worsening generalized weakness, now unable to get out of bed or take care of himself.  40 pound weight loss over the past several months.  No evidence of acute stroke.  Vital signs are unremarkable, not septic.  Check labs, chest x-ray urinalysis.  ----------------------------------------- 11:19 PM on 08/24/2018 -----------------------------------------  Labs overall unremarkable except for prerenal azotemia consistent with dehydration and mild hypokalemia.  No evidence of GI bleed.  Chest x-ray shows a large right pleural effusion, possibly due to underlying pneumonia.  With his reported home fevers and productive cough, I will start on antibiotics and plan to admit for further management.  Oxygenation is borderline, may need nasal cannula oxygen support.      ____________________________________________   FINAL CLINICAL IMPRESSION(S) / ED DIAGNOSES    Final diagnoses:  Generalized weakness  Pleural effusion  Community acquired pneumonia of right lung, unspecified part of lung     ED Discharge Orders    None      Portions of this note were generated with dragon dictation software. Dictation errors may occur despite best attempts at proofreading.     Sharman Cheek, MD 08/24/18 2322

## 2018-08-24 NOTE — ED Triage Notes (Signed)
Pt arrives via ems from home. EMS called out for increased weakness. Pt states he has had increases weakness for the last few months but significantly worse since lunch today. Pt has hx of parkinson but denies any pain. Pt also reports dizziness today.

## 2018-08-25 ENCOUNTER — Telehealth: Payer: Self-pay | Admitting: Internal Medicine

## 2018-08-25 ENCOUNTER — Other Ambulatory Visit: Payer: Self-pay

## 2018-08-25 ENCOUNTER — Inpatient Hospital Stay: Payer: Medicare Other

## 2018-08-25 DIAGNOSIS — Z7189 Other specified counseling: Secondary | ICD-10-CM | POA: Diagnosis not present

## 2018-08-25 DIAGNOSIS — E876 Hypokalemia: Secondary | ICD-10-CM | POA: Diagnosis present

## 2018-08-25 DIAGNOSIS — Z66 Do not resuscitate: Secondary | ICD-10-CM | POA: Diagnosis present

## 2018-08-25 DIAGNOSIS — Z7982 Long term (current) use of aspirin: Secondary | ICD-10-CM | POA: Diagnosis not present

## 2018-08-25 DIAGNOSIS — Z7401 Bed confinement status: Secondary | ICD-10-CM | POA: Diagnosis not present

## 2018-08-25 DIAGNOSIS — R443 Hallucinations, unspecified: Secondary | ICD-10-CM | POA: Diagnosis present

## 2018-08-25 DIAGNOSIS — R1310 Dysphagia, unspecified: Secondary | ICD-10-CM | POA: Diagnosis present

## 2018-08-25 DIAGNOSIS — I10 Essential (primary) hypertension: Secondary | ICD-10-CM | POA: Diagnosis present

## 2018-08-25 DIAGNOSIS — J189 Pneumonia, unspecified organism: Secondary | ICD-10-CM | POA: Diagnosis present

## 2018-08-25 DIAGNOSIS — R410 Disorientation, unspecified: Secondary | ICD-10-CM | POA: Diagnosis present

## 2018-08-25 DIAGNOSIS — R634 Abnormal weight loss: Secondary | ICD-10-CM | POA: Diagnosis present

## 2018-08-25 DIAGNOSIS — M1990 Unspecified osteoarthritis, unspecified site: Secondary | ICD-10-CM | POA: Diagnosis present

## 2018-08-25 DIAGNOSIS — Z7951 Long term (current) use of inhaled steroids: Secondary | ICD-10-CM | POA: Diagnosis not present

## 2018-08-25 DIAGNOSIS — Z515 Encounter for palliative care: Secondary | ICD-10-CM | POA: Diagnosis not present

## 2018-08-25 DIAGNOSIS — J869 Pyothorax without fistula: Secondary | ICD-10-CM | POA: Diagnosis not present

## 2018-08-25 DIAGNOSIS — E860 Dehydration: Secondary | ICD-10-CM | POA: Diagnosis present

## 2018-08-25 DIAGNOSIS — G20 Parkinson's disease: Secondary | ICD-10-CM | POA: Diagnosis present

## 2018-08-25 DIAGNOSIS — I2510 Atherosclerotic heart disease of native coronary artery without angina pectoris: Secondary | ICD-10-CM | POA: Diagnosis present

## 2018-08-25 DIAGNOSIS — J9601 Acute respiratory failure with hypoxia: Secondary | ICD-10-CM | POA: Diagnosis present

## 2018-08-25 DIAGNOSIS — J851 Abscess of lung with pneumonia: Secondary | ICD-10-CM | POA: Diagnosis present

## 2018-08-25 DIAGNOSIS — E785 Hyperlipidemia, unspecified: Secondary | ICD-10-CM | POA: Diagnosis present

## 2018-08-25 DIAGNOSIS — G9349 Other encephalopathy: Secondary | ICD-10-CM | POA: Diagnosis not present

## 2018-08-25 DIAGNOSIS — J918 Pleural effusion in other conditions classified elsewhere: Secondary | ICD-10-CM | POA: Diagnosis present

## 2018-08-25 LAB — CBC
HEMATOCRIT: 29.5 % — AB (ref 39.0–52.0)
HEMOGLOBIN: 9.7 g/dL — AB (ref 13.0–17.0)
MCH: 32.3 pg (ref 26.0–34.0)
MCHC: 32.9 g/dL (ref 30.0–36.0)
MCV: 98.3 fL (ref 80.0–100.0)
NRBC: 0 % (ref 0.0–0.2)
Platelets: 289 10*3/uL (ref 150–400)
RBC: 3 MIL/uL — ABNORMAL LOW (ref 4.22–5.81)
RDW: 12.7 % (ref 11.5–15.5)
WBC: 10.3 10*3/uL (ref 4.0–10.5)

## 2018-08-25 LAB — BODY FLUID CELL COUNT WITH DIFFERENTIAL
Eos, Fluid: 1 %
LYMPHS FL: 3 %
Monocyte-Macrophage-Serous Fluid: 11 %
NEUTROPHIL FLUID: 86 %
WBC FLUID: 5645 uL

## 2018-08-25 LAB — BASIC METABOLIC PANEL
ANION GAP: 9 (ref 5–15)
BUN: 30 mg/dL — AB (ref 8–23)
CO2: 28 mmol/L (ref 22–32)
Calcium: 7.7 mg/dL — ABNORMAL LOW (ref 8.9–10.3)
Chloride: 96 mmol/L — ABNORMAL LOW (ref 98–111)
Creatinine, Ser: 0.76 mg/dL (ref 0.61–1.24)
GFR calc Af Amer: >60 mL/min (ref >60)
Glucose, Bld: 99 mg/dL (ref 70–99)
Potassium: 2.9 mmol/L — ABNORMAL LOW (ref 3.5–5.1)
Sodium: 133 mmol/L — ABNORMAL LOW (ref 135–145)

## 2018-08-25 LAB — GLUCOSE, PLEURAL OR PERITONEAL FLUID: Glucose, Fluid: 101 mg/dL

## 2018-08-25 LAB — PROTEIN, PLEURAL OR PERITONEAL FLUID: Total protein, fluid: 4.3 g/dL

## 2018-08-25 LAB — EXPECTORATED SPUTUM ASSESSMENT W REFEX TO RESP CULTURE: SPECIAL REQUESTS: NORMAL

## 2018-08-25 LAB — LACTATE DEHYDROGENASE, PLEURAL OR PERITONEAL FLUID: LD, Fluid: 227 U/L — ABNORMAL HIGH (ref 3–23)

## 2018-08-25 LAB — PSA: PROSTATIC SPECIFIC ANTIGEN: 0.33 ng/mL (ref 0.00–4.00)

## 2018-08-25 LAB — EXPECTORATED SPUTUM ASSESSMENT W GRAM STAIN, RFLX TO RESP C

## 2018-08-25 LAB — MAGNESIUM: Magnesium: 2 mg/dL (ref 1.7–2.4)

## 2018-08-25 MED ORDER — IOPAMIDOL (ISOVUE-300) INJECTION 61%: 75.0000 mL | Freq: Once | INTRAVENOUS | Status: DC | PRN

## 2018-08-25 MED ORDER — CARBIDOPA-LEVODOPA 25-100 MG PO TABS
2.0000 | ORAL_TABLET | Freq: Four times a day (QID) | ORAL | Status: DC
  Administered 2018-08-25 – 2018-08-28 (×6): 2 via ORAL
  Filled 2018-08-25 (×14): qty 2

## 2018-08-25 MED ORDER — SODIUM CHLORIDE 0.9 % IV SOLN
INTRAVENOUS | Status: DC
  Administered 2018-08-25 (×2): via INTRAVENOUS

## 2018-08-25 MED ORDER — ASPIRIN EC 81 MG PO TBEC
81.0000 mg | DELAYED_RELEASE_TABLET | Freq: Every day | ORAL | Status: DC
  Administered 2018-08-25: 81 mg via ORAL
  Filled 2018-08-25: qty 1

## 2018-08-25 MED ORDER — FLUTICASONE PROPIONATE 50 MCG/ACT NA SUSP
1.0000 | Freq: Every day | NASAL | Status: DC
  Administered 2018-08-25 – 2018-08-28 (×3): 1 via NASAL
  Filled 2018-08-25: qty 16

## 2018-08-25 MED ORDER — ONDANSETRON HCL 4 MG/2ML IJ SOLN: 4.0000 mg | Freq: Four times a day (QID) | INTRAMUSCULAR | Status: DC | PRN

## 2018-08-25 MED ORDER — POTASSIUM CHLORIDE 10 MEQ/100ML IV SOLN
10.0000 meq | INTRAVENOUS | Status: AC
  Administered 2018-08-25 (×4): 10 meq via INTRAVENOUS
  Filled 2018-08-25 (×4): qty 100

## 2018-08-25 MED ORDER — ACETAMINOPHEN 325 MG PO TABS: 650.0000 mg | ORAL_TABLET | Freq: Four times a day (QID) | ORAL | Status: DC | PRN

## 2018-08-25 MED ORDER — POLYETHYLENE GLYCOL 3350 17 G PO PACK
17.0000 g | PACK | Freq: Every day | ORAL | Status: DC
  Administered 2018-08-28: 17 g via ORAL
  Filled 2018-08-25 (×2): qty 1

## 2018-08-25 MED ORDER — ROPINIROLE HCL 1 MG PO TABS
1.0000 mg | ORAL_TABLET | Freq: Three times a day (TID) | ORAL | Status: DC
  Administered 2018-08-25 – 2018-08-28 (×8): 1 mg via ORAL
  Filled 2018-08-25 (×8): qty 1

## 2018-08-25 MED ORDER — MELATONIN 1 MG PO TABS: 1.0000 | ORAL_TABLET | Freq: Every evening | ORAL | Status: DC | PRN

## 2018-08-25 MED ORDER — MELATONIN 5 MG PO TABS
2.5000 mg | ORAL_TABLET | Freq: Every evening | ORAL | Status: DC | PRN
  Administered 2018-08-28: 2.5 mg via ORAL
  Filled 2018-08-25 (×3): qty 0.5

## 2018-08-25 MED ORDER — SODIUM CHLORIDE 0.9 % IV SOLN
1.0000 g | INTRAVENOUS | Status: DC
  Administered 2018-08-25: 1 g via INTRAVENOUS
  Filled 2018-08-25: qty 1
  Filled 2018-08-25: qty 10

## 2018-08-25 MED ORDER — MIRTAZAPINE 15 MG PO TABS
7.5000 mg | ORAL_TABLET | Freq: Every day | ORAL | Status: DC
  Administered 2018-08-25 – 2018-08-27 (×4): 7.5 mg via ORAL
  Filled 2018-08-25 (×4): qty 1

## 2018-08-25 MED ORDER — HYDROCOD POLST-CPM POLST ER 10-8 MG/5ML PO SUER: 5.0000 mL | Freq: Two times a day (BID) | ORAL | Status: DC | PRN

## 2018-08-25 MED ORDER — SENNOSIDES-DOCUSATE SODIUM 8.6-50 MG PO TABS: 1.0000 | ORAL_TABLET | Freq: Every day | ORAL | Status: DC | PRN

## 2018-08-25 MED ORDER — DOCUSATE SODIUM 100 MG PO CAPS
100.0000 mg | ORAL_CAPSULE | Freq: Two times a day (BID) | ORAL | Status: DC
  Administered 2018-08-25 – 2018-08-28 (×5): 100 mg via ORAL
  Filled 2018-08-25 (×7): qty 1

## 2018-08-25 MED ORDER — ONDANSETRON HCL 4 MG PO TABS: 4.0000 mg | ORAL_TABLET | Freq: Four times a day (QID) | ORAL | Status: DC | PRN

## 2018-08-25 MED ORDER — SODIUM CHLORIDE 0.9 % IV SOLN
500.0000 mg | INTRAVENOUS | Status: DC
  Administered 2018-08-25 – 2018-08-27 (×3): 500 mg via INTRAVENOUS
  Filled 2018-08-25 (×4): qty 500

## 2018-08-25 MED ORDER — POTASSIUM CHLORIDE CRYS ER 20 MEQ PO TBCR
40.0000 meq | EXTENDED_RELEASE_TABLET | Freq: Two times a day (BID) | ORAL | Status: AC
  Administered 2018-08-25 (×2): 40 meq via ORAL
  Filled 2018-08-25 (×2): qty 2

## 2018-08-25 MED ORDER — ADULT MULTIVITAMIN W/MINERALS CH
1.0000 | ORAL_TABLET | Freq: Every day | ORAL | Status: DC
  Administered 2018-08-25 – 2018-08-28 (×3): 1 via ORAL
  Filled 2018-08-25 (×3): qty 1

## 2018-08-25 MED ORDER — CARBIDOPA-LEVODOPA ER 25-100 MG PO TBCR
2.0000 | EXTENDED_RELEASE_TABLET | Freq: Every day | ORAL | Status: DC
  Administered 2018-08-25 – 2018-08-27 (×3): 2 via ORAL
  Filled 2018-08-25 (×4): qty 2

## 2018-08-25 MED ORDER — ACETAMINOPHEN 650 MG RE SUPP: 650.0000 mg | Freq: Four times a day (QID) | RECTAL | Status: DC | PRN

## 2018-08-25 MED ORDER — HEPARIN SODIUM (PORCINE) 5000 UNIT/ML IJ SOLN
5000.0000 [IU] | Freq: Three times a day (TID) | INTRAMUSCULAR | Status: DC
  Administered 2018-08-25: 5000 [IU] via SUBCUTANEOUS
  Filled 2018-08-25: qty 1

## 2018-08-25 MED ORDER — HYDROCODONE-ACETAMINOPHEN 5-325 MG PO TABS
1.0000 | ORAL_TABLET | ORAL | Status: DC | PRN
  Administered 2018-08-25: 1 via ORAL
  Filled 2018-08-25: qty 1

## 2018-08-25 MED ORDER — BISACODYL 5 MG PO TBEC: 5.0000 mg | DELAYED_RELEASE_TABLET | Freq: Every day | ORAL | Status: DC | PRN

## 2018-08-25 MED ORDER — TRAZODONE HCL 50 MG PO TABS: 25.0000 mg | ORAL_TABLET | Freq: Every evening | ORAL | Status: DC | PRN

## 2018-08-25 MED ORDER — IOHEXOL 300 MG/ML  SOLN
75.0000 mL | Freq: Once | INTRAMUSCULAR | Status: AC | PRN
  Administered 2018-08-25: 75 mL via INTRAVENOUS

## 2018-08-25 NOTE — H&P (Signed)
[facility] - [place] at [facility]   PATIENT NAME: Johnny Barnett    MR#:  [id number]  DATE OF BIRTH:  [birth year]  DATE OF ADMISSION:  08/24/2018  PRIMARY CARE PHYSICIAN: Karie Schwalbe, MD   REQUESTING/REFERRING PHYSICIAN:   CHIEF COMPLAINT:   Chief Complaint  Patient presents with  . Weakness    HISTORY OF PRESENT ILLNESS: Johnny Barnett  is a 77 y.o. male with a known history of osteoarthritis, Parkinson's disease, hyperlipidemia. Patient presented to emergency room for productive cough associated with shortness of breath and severe generalized weakness and fatigue, going on for the past week, gradually getting worse.  His symptoms are worse with exertion.  Per EMS records, patient was febrile at home, with temperature at 102. Patient reports unintentional 40 pound weight loss over the past 6 months. Blood test done emergency room are notable for hemoglobin of 10.6, potassium 3.3, WBC 11.2, troponin 0 0.03. Chest x-ray shows right moderate pleural effusion and possible infiltrate. She is admitted for further evaluation and treatment.  PAST MEDICAL HISTORY:   Past Medical History:  Diagnosis Date  . Degenerative joint disease   . ED (erectile dysfunction)   . Hyperlipidemia   . Parkinson disease (HCC)     PAST SURGICAL HISTORY:  Past Surgical History:  Procedure Laterality Date  . CATARACT EXTRACTION W/ INTRAOCULAR LENS IMPLANT Bilateral   . CYST REMOVAL TRUNK  1/16   Dr Egbert Garibaldi  . FLEXIBLE SIGMOIDOSCOPY  08/05  . Sigmoid volvulus resection  10/13   Dr Michela Pitcher    SOCIAL HISTORY:  Social History   Tobacco Use  . Smoking status: Never Smoker  . Smokeless tobacco: Never Used  Substance Use Topics  . Alcohol use: Yes    Comment: wine    FAMILY HISTORY:  Family History  Problem Relation Age of Onset  . Diabetes Paternal Uncle   . Coronary artery disease Neg Hx   . Hypertension Neg Hx     DRUG ALLERGIES: No Known Allergies  REVIEW  OF SYSTEMS:   CONSTITUTIONAL: Positive for fever, fatigue and generalized weakness.  EYES: No changes in vision.  EARS, NOSE, AND THROAT: No tinnitus or ear pain.  RESPIRATORY: Positive for productive cough, shortness of breath, no wheezing or hemoptysis.  CARDIOVASCULAR: No chest pain, orthopnea, edema.  GASTROINTESTINAL: No nausea, vomiting, diarrhea or abdominal pain.  GENITOURINARY: No dysuria, hematuria.  ENDOCRINE: No polyuria, nocturia. HEMATOLOGY: No bleeding. SKIN: No rash or lesion. MUSCULOSKELETAL: [place] for severe generalized weakness.   NEUROLOGIC: No focal weakness.  PSYCHIATRY: No anxiety or depression.   MEDICATIONS AT HOME:  Prior to Admission medications   Medication Sig Start Date End Date Taking? Authorizing Provider  acetaminophen (TYLENOL) 500 MG tablet Take 500 mg by mouth every 6 (six) hours as needed.   Yes [provider]  aspirin 81 MG EC tablet Take 81 mg by mouth daily.     Yes [provider]  carbidopa-levodopa (SINEMET IR) 25-100 MG per tablet Take 2 tablets by mouth 4 (four) times daily. Every 0700, 1130, 1530, and 1830 03/29/15  Yes [provider]  Carbidopa-Levodopa ER (SINEMET CR) 25-100 MG tablet controlled release Take 2 tablets by mouth at bedtime.   Yes [provider]  fluticasone (FLONASE) 50 MCG/ACT nasal spray PLACE 2 SPRAYS INTO EACH NOSTRIL DAILY. 08/24/17  Yes Karie Schwalbe, MD  Melatonin 1 MG TABS Take 1 tablet by mouth at bedtime as needed.   Yes [provider]  mirtazapine (REMERON) 15 MG tablet Take 7.5 mg by mouth at bedtime.   Yes [provider]  Multiple Vitamin (MULTIVITAMIN) tablet Take 1 tablet by mouth daily.     Yes [provider]  polyethylene glycol powder (GLYCOLAX/MIRALAX) powder Take 17 g by mouth daily.   Yes [provider]  rOPINIRole (REQUIP) 1 MG tablet Take 1 mg by mouth 3 (three) times daily.  12/28/14  Yes [provider]   senna-docusate (SENOKOT-S) 8.6-50 MG tablet Take 1 tablet by mouth daily as needed.    Yes [provider]  triamcinolone cream (KENALOG) 0.1 % APPLY TWICE A DAY AS NEEDED FOR ITCHY AREAS 08/24/17  Yes Karie Schwalbe, MD      PHYSICAL EXAMINATION:   VITAL SIGNS: Blood pressure 114/63, pulse 63, temperature 99.8 F (37.7 C), temperature source Oral, resp. rate 16, height 5\' 9"  (1.753 m), weight 63.5 kg, SpO2 96 %.  GENERAL:  76 y.o.-year-old patient lying in the bed with no acute distress, at rest.  She looks frail, ill-appearing. EYES: Pupils equal, round, reactive to light and accommodation. No scleral icterus. Extraocular muscles intact.  HEENT: Head atraumatic, normocephalic. Oropharynx and nasopharynx clear.  NECK:  Supple, no jugular venous distention. No thyroid enlargement, no tenderness.  LUNGS: Reduced breath sounds bilaterally. No use of accessory muscles of respiration.  He is on oxygen 3 L per nasal cannula. CARDIOVASCULAR: S1, S2 normal. No S3/S4.  ABDOMEN: Soft, nontender, nondistended. Bowel sounds present. No organomegaly or mass.  EXTREMITIES: No pedal edema, cyanosis, or clubbing.  NEUROLOGIC: No focal weakness. PSYCHIATRIC: The patient is alert and oriented x 3.  SKIN: No obvious rash, lesion, or ulcer.   LABORATORY PANEL:   CBC Recent Labs  Lab 08/24/18 1911  WBC 11.2*  HGB 10.6*  HCT 31.7*  PLT 326  MCV 97.2  MCH 32.5  MCHC 33.4  RDW 12.6   ------------------------------------------------------------------------------------------------------------------  Chemistries  Recent Labs  Lab 08/24/18 1911  NA 131*  K 3.3*  CL 96*  CO2 27  GLUCOSE 117*  BUN 35*  CREATININE 0.80  CALCIUM 8.1*   ------------------------------------------------------------------------------------------------------------------ estimated creatinine clearance is 69.5 mL/min (by C-G formula based on SCr of 0.8  mg/dL). ------------------------------------------------------------------------------------------------------------------ No results for input(s): TSH, T4TOTAL, T3FREE, THYROIDAB in the last 72 hours.  Invalid input(s): FREET3   Coagulation profile No results for input(s): INR, PROTIME in the last 168 hours. ------------------------------------------------------------------------------------------------------------------- No results for input(s): DDIMER in the last 72 hours. -------------------------------------------------------------------------------------------------------------------  Cardiac Enzymes Recent Labs  Lab 08/24/18 1911  TROPONINI 0.03*   ------------------------------------------------------------------------------------------------------------------ Invalid input(s): POCBNP  ---------------------------------------------------------------------------------------------------------------  Urinalysis    Component Value Date/Time   COLORURINE YELLOW (A) 08/24/2018 1859   APPEARANCEUR CLEAR (A) 08/24/2018 1859   APPEARANCEUR Hazy 08/05/2012 2239   LABSPEC 1.029 08/24/2018 1859   LABSPEC 1.017 08/05/2012 2239   PHURINE 5.0 08/24/2018 1859   GLUCOSEU NEGATIVE 08/24/2018 1859   GLUCOSEU Negative 08/05/2012 2239   HGBUR SMALL (A) 08/24/2018 1859   BILIRUBINUR NEGATIVE 08/24/2018 1859   BILIRUBINUR Negative 08/05/2012 2239   KETONESUR 5 (A) 08/24/2018 1859   PROTEINUR 100 (A) 08/24/2018 1859   NITRITE NEGATIVE 08/24/2018 1859   LEUKOCYTESUR NEGATIVE 08/24/2018 1859   LEUKOCYTESUR Negative 08/05/2012 2239     RADIOLOGY: Dg Chest Portable 1 View  Result Date: 08/24/2018 CLINICAL DATA:  Increased weakness for last few months significantly worsened since lunch today, history Parkinson's EXAM: PORTABLE CHEST 1 VIEW COMPARISON:  Portable exam 1935 hours compared to 08/02/2012 FINDINGS: Enlargement  of cardiac silhouette. Atherosclerotic calcification aorta.  Mediastinal contours and pulmonary vascularity normal. Moderate to large RIGHT pleural effusion and atelectasis of the lower RIGHT lung. Underlying infiltrate and/or tumor not excluded in this setting. LEFT lung clear. Bones demineralized. No pneumothorax. IMPRESSION: New moderate to large RIGHT pleural effusion and significant lower RIGHT lung atelectasis, cannot exclude underlying abnormalities. Electronically Signed   By: Ulyses Southward M.D.   On: 08/24/2018 20:29    EKG: Orders placed or performed during the hospital encounter of 08/24/18  . EKG 12-Lead  . EKG 12-Lead  . ED EKG  . ED EKG    IMPRESSION AND PLAN:  1.  Acute respiratory failure with hypoxia, likely secondary to pneumonia and pleural effusion.  We will start treatment with IV antibiotics, nebulizer and oxygen.  Will check CT scan of the chest for further described possible infiltrate/mass.  Pulmonary service is consulted for further evaluation and treatment. 2. CAP, see treatment as above, under #1. 3.  Moderate right-sided pleural effusion, likely related to pneumonia.  We will further evaluate with CT scan of the chest.  Pulmonary service is consulted for further evaluation and treatment. 4.  Parkinson's, continue maintenance therapy. 5.  Hypertension, stable, resume home medications. 6.  We will kalemia, replace potassium per protocol.  All the records are reviewed and case discussed with ED provider. Management plans discussed with the patient, family and they are in agreement.  CODE STATUS: Advance Directive Documentation     Most Recent Value  Type of Advance Directive  Healthcare Power of Attorney  Pre-existing out of facility DNR order (yellow form or pink MOST form)  -  "MOST" Form in Place?  -       TOTAL TIME TAKING CARE OF THIS PATIENT: 55 minutes.    Cammy Copa M.D on 08/25/2018 at 1:24 AM  Between 7am to 6pm - Pager - [phone]  After 6pm go to [email or web address] - password [email or web address] [facility] [place] at [facility]  [phone]  CC: Primary care physician; Karie Schwalbe, MD

## 2018-08-25 NOTE — Clinical Social Work Note (Signed)
Clinical Social Work Assessment  Patient Details  Name: Johnny Barnett MRN: [id number] Date of Birth: June 07, 1941  Date of referral:  08/25/18               Reason for consult:  Facility Placement                Permission sought to share information with:  Chartered certified accountant granted to share information::  Yes, Verbal Permission Granted  Name::      [place]::   [place]   Relationship::     Contact Information:     Housing/Transportation Living arrangements for the past 2 months:  [place] of Information:  Adult Children Patient Interpreter Needed:  None Criminal Activity/Legal Involvement Pertinent to Current Situation/Hospitalization:  No - Comment as needed Significant Relationships:  Adult Children, Spouse Lives with:  Spouse Do you feel safe going back to the place where you live?    Need for family participation in patient care:  Yes (Comment)  Care giving concerns:  Patient lives in [place] with his wife Archie Patten.    Social Worker assessment / plan:  Holiday representative ([place]) received verbal consult from RN that patient's PCP is recommending SNF. CSW met with patient's 2 adult daughters Ishmael Holter and Alma Friendly at bedside. Patient was off the floor during assessment. Per daughters patient lives in [place] with his wife who is his HPOA. Per daughters patient walks with a walker at baseline but has declined over the last couple of days. Per daughter they believe patient will benefit from short term rehab at a SNF. CSW explained that PT will evaluate patient and make a recommendation of home health or SNF. CSW explained that patient's insurance Blue Medicare will have to approve SNF. Daughters verbalized their understanding and requested [facility]. CSW explained that [facility] is not in network with [facility] and patient would have to pay out of pocket if he went there. Daughters are agreeable to SNF search in [place]. FL2 complete and faxed out. CSW also explained long term care options such has Medicaid and private pay. CSW also discussed palliative and hospice care criteria. Daughters thanked CSW for information and requested CSW to give patient's wife bed offers tomorrow. CSW will continue to follow and assist as needed.   Employment status:  Retired Nurse, adult PT Recommendations:  Not assessed at this time Information / Referral to community resources:  [place]  Patient/Family's Response to care:  Patient's daughters requested SNF placement.   Patient/Family's Understanding of and Emotional Response to Diagnosis, Current Treatment, and Prognosis:  Patient's daughters were very pleasant and thanked CSW for assistance.   Emotional Assessment Appearance:  Appears stated age Attitude/Demeanor/Rapport:  Unable to Assess Affect (typically observed):  Unable to Assess Orientation:  Oriented to Self, Oriented to Place, Oriented to  Time, Oriented to Situation Alcohol / Substance use:  Not Applicable Psych involvement (Current and /or in the community):  No (Comment)  Discharge Needs  Concerns to be addressed:  Discharge Planning Concerns Readmission within the last 30 days:  No Current discharge risk:  Dependent with Mobility Barriers to Discharge:  Continued Medical Work up   [employer], Veronia Beets, LCSW 08/25/2018, 3:37 PM

## 2018-08-25 NOTE — Procedures (Signed)
  Procedure: US R thoracentesis   EBL:   minimal Complications:  none immediate  See full dictation in [facility] PACS.  D. Nilson Tabora MD Main # [phone] Pager  [phone]      

## 2018-08-25 NOTE — Progress Notes (Signed)
Potassium 2.9 this morning. MD made aware. New order received.

## 2018-08-25 NOTE — Progress Notes (Signed)
Spoke with Misty Stanley in pharmacy to ask if patient's Sinemet due times could be changed to his times of 730, 11, 1430 and 1600. Per Misty Stanley this can be done and she will change the times.

## 2018-08-25 NOTE — [place] FL2 (Signed)
[place] MEDICAID FL2 LEVEL OF CARE SCREENING TOOL     IDENTIFICATION  Patient Name: Johnny Barnett Birthdate: [birth year] Sex: male Admission Date (Current Location): 08/24/2018  [place] and [place] Number:  Chiropodist and Address:  [facility], [address] [place], [place] [address]      Provider Number: [id number]  Attending Physician Name and Address:  Alford Highland, MD  Relative Name and Phone Number:       Current Level of Care: Hospital Recommended Level of Care: Skilled Nursing Facility Prior Approval Number:    Date Approved/Denied:   PASRR Number: [id number])  Discharge Plan: SNF    Current Diagnoses: Patient Active Problem List   Diagnosis Date Noted  . CAP (community acquired pneumonia) 08/25/2018  . Orthostasis 07/14/2018  . Vision abnormalities 01/21/2018  . Malnutrition of moderate degree (HCC) 08/24/2017  . Advance directive discussed with patient 08/15/2015  . Actinic keratosis 12/02/2012  . Routine general medical examination at a health care facility 07/23/2012  . Other constipation 09/09/2011  . Parkinson disease (HCC) 07/07/2011  . HTN (hypertension) 05/30/2011  . Memory loss 10/16/2010  . HYPERLIPIDEMIA 05/17/2007  . ERECTILE DYSFUNCTION 05/17/2007  . DEGENERATIVE JOINT DISEASE 05/17/2007    Orientation RESPIRATION BLADDER Height & Weight     Self, Time, Situation, Place  O2(2 Liters Oxygen. ) Continent Weight: 143 lb 11.2 oz (65.2 kg) Height:  5\' 7"  (170.2 cm)  BEHAVIORAL SYMPTOMS/MOOD NEUROLOGICAL BOWEL NUTRITION STATUS      Incontinent Diet(Diet: Heart Healthy )  AMBULATORY STATUS COMMUNICATION OF NEEDS Skin   Extensive Assist Verbally Normal                       Personal Care Assistance Level of Assistance  Bathing, Feeding, Dressing Bathing Assistance: Limited assistance Feeding assistance: Independent Dressing Assistance: Limited assistance     Functional Limitations Info   Sight, Hearing, Speech Sight Info: Adequate Hearing Info: Adequate Speech Info: Adequate    SPECIAL CARE FACTORS FREQUENCY  PT (By licensed PT), OT (By licensed OT)     PT Frequency: (5) OT Frequency: (5)            Contractures      Additional Factors Info  Code Status, Allergies Code Status Info: (DNR ) Allergies Info: (No Known Allergies. )           Current Medications (08/25/2018):  This is the current hospital active medication list Current Facility-Administered Medications  Medication Dose Route Frequency Provider Last Rate Last Dose  . 0.9 %  sodium chloride infusion   Intravenous Continuous Cammy Copa, MD   Stopped at 08/25/18 1259  . acetaminophen (TYLENOL) tablet 650 mg  650 mg Oral Q6H PRN Cammy Copa, MD       Or  . acetaminophen (TYLENOL) suppository 650 mg  650 mg Rectal Q6H PRN Cammy Copa, MD      . aspirin EC tablet 81 mg  81 mg Oral Daily Cammy Copa, MD   81 mg at 08/25/18 0835  . azithromycin (ZITHROMAX) 500 mg in sodium chloride 0.9 % 250 mL IVPB  500 mg Intravenous Q24H Cammy Copa, MD      . bisacodyl (DULCOLAX) EC tablet 5 mg  5 mg Oral Daily PRN Cammy Copa, MD      . carbidopa-levodopa (SINEMET IR) 25-100 MG per tablet immediate release 2 tablet  2 tablet Oral QID Cammy Copa, MD   2 tablet at 08/25/18 [phone]  .  Carbidopa-Levodopa ER (SINEMET CR) 25-100 MG tablet controlled release 2 tablet  2 tablet Oral QHS Cammy Copa, MD      . cefTRIAXone (ROCEPHIN) 1 g in sodium chloride 0.9 % 100 mL IVPB  1 g Intravenous Q24H Cammy Copa, MD      . docusate sodium (COLACE) capsule 100 mg  100 mg Oral BID Cammy Copa, MD      . fluticasone Aleda Grana) 50 MCG/ACT nasal spray 1 spray  1 spray Each Nare Daily Cammy Copa, MD   1 spray at 08/25/18 [phone]  . HYDROcodone-acetaminophen (NORCO/VICODIN) 5-325 MG per tablet 1-2 tablet  1-2 tablet Oral Q4H PRN Cammy Copa, MD      . Melatonin TABS 2.5 mg  2.5 mg Oral QHS PRN Cammy Copa, MD       . mirtazapine (REMERON) tablet 7.5 mg  7.5 mg Oral QHS Cammy Copa, MD   7.5 mg at 08/25/18 0255  . multivitamin with minerals tablet 1 tablet  1 tablet Oral Daily Cammy Copa, MD   1 tablet at 08/25/18 [phone]  . ondansetron (ZOFRAN) tablet 4 mg  4 mg Oral Q6H PRN Cammy Copa, MD       Or  . ondansetron [facility]) injection 4 mg  4 mg Intravenous Q6H PRN Cammy Copa, MD      . polyethylene glycol (MIRALAX / GLYCOLAX) packet 17 g  17 g Oral Daily Wieting, Richard, MD      . potassium chloride SA (K-DUR,KLOR-CON) CR tablet 40 mEq  40 mEq Oral BID Barbaraann Rondo, MD   40 mEq at 08/25/18 0634  . rOPINIRole (REQUIP) tablet 1 mg  1 mg Oral TID Cammy Copa, MD   1 mg at 08/25/18 0835  . senna-docusate (Senokot-S) tablet 1 tablet  1 tablet Oral Daily PRN Cammy Copa, MD      . traZODone (DESYREL) tablet 25 mg  25 mg Oral QHS PRN Cammy Copa, MD         Discharge Medications: Please see discharge summary for a list of discharge medications.  Relevant Imaging Results:  Relevant Lab Results:   Additional Information (SSN: [id number])  Careli Luzader, Darleen Crocker, LCSW

## 2018-08-25 NOTE — Progress Notes (Signed)
Patient ID: Johnny Barnett, male   DOB: 31-Oct-1941, 77 y.o.   MRN: [id number]  ACP note  Patient, daughters at the bedside  Diagnosis: Acute respiratory failure with hypoxia, bilateral pneumonia, large right pleural effusion, Parkinson's disease, hypertension, hypokalemia.  CODE STATUS discussed.  Patient changed from a full code to DO NOT RESUSCITATE.  Plan.  Order ultrasound-guided thoracentesis to rule out empyema versus other ominous etiology.  Benefits and risks of thoracentesis explained to the patient.  Continue IV antibiotics.  Replace potassium.  Physical therapy evaluation.  Add on a PSA since the patient has weight loss.  Time spent on ACP discussion 28 minutes Dr. Alford Highland

## 2018-08-25 NOTE — Progress Notes (Signed)
Electrolyte replacement  10/16 AM K+ 2.9. 40 mEq KCl IV total ordered. Recheck BMP with AM labs.  Fulton Reek, PharmD, BCPS  08/25/18 6:10 AM

## 2018-08-25 NOTE — Clinical Social Work Placement (Signed)
   CLINICAL SOCIAL WORK PLACEMENT  NOTE  Date:  08/25/2018  Patient Details  Name: Johnny Barnett MRN: [id number] Date of Birth: 22-Dec-1940  Clinical Social Work is seeking post-discharge placement for this patient at the Skilled  Nursing Facility level of care (*CSW will initial, date and re-position this form in  chart as items are completed):  Yes   Patient/family provided with [facility] Clinical Social Work Department's list of facilities offering this level of care within the geographic area requested by the patient (or if unable, by the patient's family).  Yes   Patient/family informed of their freedom to choose among providers that offer the needed level of care, that participate in Medicare, Medicaid or managed care program needed by the patient, have an available bed and are willing to accept the patient.  Yes   Patient/family informed of [facility]'s ownership interest in [facility] and [facility], as well as of the fact that they are under no obligation to receive care at these facilities.  PASRR submitted to EDS on 08/25/18     PASRR number received on 08/25/18     Existing PASRR number confirmed on       FL2 transmitted to all facilities in geographic area requested by pt/family on 08/25/18     FL2 transmitted to all facilities within larger geographic area on       Patient informed that his/her managed care company has contracts with or will negotiate with certain facilities, including the following:            Patient/family informed of bed offers received.  Patient chooses bed at       Physician recommends and patient chooses bed at      Patient to be transferred to   on  .  Patient to be transferred to facility by       Patient family notified on   of transfer.  Name of family member notified:        PHYSICIAN       Additional Comment:    _______________________________________________ Prajna Vanderpool, Darleen Crocker, LCSW 08/25/2018, 3:36 PM

## 2018-08-25 NOTE — Progress Notes (Signed)
PT Cancellation Note  Patient Details Name: Johnny Barnett MRN: [id number] DOB: [birth year]   Cancelled Treatment:     Pt chart reviewed. Pt unavailable at this time, out of room for thoracentesis procedure. Will attempt evaluation at time/date when pt is available and medically stable.    Arvilla Meres, SPT  08/25/2018, 3:22 PM

## 2018-08-25 NOTE — Telephone Encounter (Signed)
Copied from CRM [id number]. Topic: Quick Communication - See Telephone Encounter >> Aug 25, 2018  9:40 AM Lorrine Kin, NT wrote: CRM for notification. See Telephone encounter for: 08/25/18. Patient's wife calling and states that the patient has been admitted to [facility] for pneumonia. Wife is not sure how the process goes, but she states that she will need some help once patient is home. States that she cannot lift him. She is not sure if his progression is from the parkinson's or pneumonia. Would like a call to discuss hospice coming to evaluate him and the pneumonia once he is home. At this time she is unsure when he will be home. CB#: [phone]

## 2018-08-25 NOTE — Telephone Encounter (Signed)
Called and spoke to daughter in room. Suggested they speak to the social worker there---I think he will likely need inpatient rehab He was coughing and unable to talk on the phone

## 2018-08-26 ENCOUNTER — Inpatient Hospital Stay: Payer: Medicare Other

## 2018-08-26 DIAGNOSIS — G20 Parkinson's disease: Secondary | ICD-10-CM

## 2018-08-26 DIAGNOSIS — Z515 Encounter for palliative care: Secondary | ICD-10-CM

## 2018-08-26 DIAGNOSIS — J869 Pyothorax without fistula: Secondary | ICD-10-CM

## 2018-08-26 DIAGNOSIS — Z7189 Other specified counseling: Secondary | ICD-10-CM

## 2018-08-26 LAB — CBC
HEMATOCRIT: 31.8 % — AB (ref 39.0–52.0)
HEMOGLOBIN: 10.4 g/dL — AB (ref 13.0–17.0)
MCH: 32.5 pg (ref 26.0–34.0)
MCHC: 32.7 g/dL (ref 30.0–36.0)
MCV: 99.4 fL (ref 80.0–100.0)
Platelets: 309 10*3/uL (ref 150–400)
RBC: 3.2 MIL/uL — AB (ref 4.22–5.81)
RDW: 12.7 % (ref 11.5–15.5)
WBC: 15 10*3/uL — AB (ref 4.0–10.5)
nRBC: 0 % (ref 0.0–0.2)

## 2018-08-26 LAB — BLOOD GAS, ARTERIAL
Acid-Base Excess: 2.8 mmol/L — ABNORMAL HIGH (ref 0.0–2.0)
Bicarbonate: 25.5 mmol/L (ref 20.0–28.0)
FIO2: 1
O2 SAT: 93.4 %
PATIENT TEMPERATURE: 37
PCO2 ART: 32 mmHg (ref 32.0–48.0)
pH, Arterial: 7.51 — ABNORMAL HIGH (ref 7.350–7.450)
pO2, Arterial: 61 mmHg — ABNORMAL LOW (ref 83.0–108.0)

## 2018-08-26 LAB — PH, BODY FLUID: pH, Body Fluid: 7.3

## 2018-08-26 LAB — BASIC METABOLIC PANEL
Anion gap: 8 (ref 5–15)
BUN: 33 mg/dL — AB (ref 8–23)
CHLORIDE: 97 mmol/L — AB (ref 98–111)
CO2: 27 mmol/L (ref 22–32)
Calcium: 7.9 mg/dL — ABNORMAL LOW (ref 8.9–10.3)
Creatinine, Ser: 0.7 mg/dL (ref 0.61–1.24)
GFR calc Af Amer: >60 mL/min (ref >60)
GFR calc non Af Amer: >60 mL/min (ref >60)
GLUCOSE: 103 mg/dL — AB (ref 70–99)
POTASSIUM: 4.2 mmol/L (ref 3.5–5.1)
SODIUM: 132 mmol/L — AB (ref 135–145)

## 2018-08-26 LAB — PHOSPHORUS: PHOSPHORUS: 2.7 mg/dL (ref 2.5–4.6)

## 2018-08-26 LAB — MAGNESIUM: Magnesium: 2.1 mg/dL (ref 1.7–2.4)

## 2018-08-26 MED ORDER — SENNOSIDES-DOCUSATE SODIUM 8.6-50 MG PO TABS
1.0000 | ORAL_TABLET | Freq: Every day | ORAL | Status: DC
  Administered 2018-08-27 – 2018-08-28 (×2): 1 via ORAL
  Filled 2018-08-26 (×2): qty 1

## 2018-08-26 MED ORDER — MIDAZOLAM HCL 5 MG/5ML IJ SOLN
INTRAMUSCULAR | Status: AC
  Filled 2018-08-26: qty 5

## 2018-08-26 MED ORDER — FUROSEMIDE 10 MG/ML IJ SOLN
40.0000 mg | Freq: Once | INTRAMUSCULAR | Status: AC
  Administered 2018-08-26: 40 mg via INTRAVENOUS
  Filled 2018-08-26: qty 4

## 2018-08-26 MED ORDER — METRONIDAZOLE IN NACL 5-0.79 MG/ML-% IV SOLN
500.0000 mg | Freq: Three times a day (TID) | INTRAVENOUS | Status: DC
  Administered 2018-08-26 – 2018-08-28 (×6): 500 mg via INTRAVENOUS
  Filled 2018-08-26 (×8): qty 100

## 2018-08-26 MED ORDER — FENTANYL CITRATE (PF) 100 MCG/2ML IJ SOLN
INTRAMUSCULAR | Status: AC
  Filled 2018-08-26: qty 4

## 2018-08-26 MED ORDER — METHYLPREDNISOLONE SODIUM SUCC 40 MG IJ SOLR
40.0000 mg | Freq: Every day | INTRAMUSCULAR | Status: DC
  Administered 2018-08-26: 40 mg via INTRAVENOUS
  Filled 2018-08-26: qty 1

## 2018-08-26 MED ORDER — IPRATROPIUM-ALBUTEROL 0.5-2.5 (3) MG/3ML IN SOLN
3.0000 mL | Freq: Four times a day (QID) | RESPIRATORY_TRACT | Status: DC
  Administered 2018-08-26 – 2018-08-28 (×7): 3 mL via RESPIRATORY_TRACT
  Filled 2018-08-26 (×8): qty 3

## 2018-08-26 MED ORDER — SODIUM CHLORIDE 0.9 % IV SOLN
1.0000 g | Freq: Two times a day (BID) | INTRAVENOUS | Status: DC
  Administered 2018-08-27 (×3): 1 g via INTRAVENOUS
  Filled 2018-08-26 (×3): qty 1
  Filled 2018-08-26: qty 10

## 2018-08-26 MED ORDER — SODIUM CHLORIDE 0.9 % IV SOLN
INTRAVENOUS | Status: DC
  Administered 2018-08-26 (×2): via INTRAVENOUS

## 2018-08-26 NOTE — Progress Notes (Signed)
Patient received from 153 via bed on O2 @ 6 LPM [place], required high-flow O2 on 1A; Respiratory notified of need for high-flow meter; received high-flow meter and patient now on 10 LPM; no acute respiratory distress noted at this time; wife at bedside; call bell, urinal within reach and bed alarm activated; mouth moisturizer applied to lips/mouth, swabs given to patient and wife; Windy Carina, RN 9:33 PM 08/26/2018

## 2018-08-26 NOTE — Progress Notes (Signed)
Patient ID: Johnny Barnett, male   DOB: 02-Mar-1941, 77 y.o.   MRN: [id number]  [employer] PROGRESS NOTE  Johnny Barnett:[id number] DOB: 1941-11-09 DOA: 08/24/2018 PCP: Karie Schwalbe, MD  HPI/Subjective: Called back that they placed him on a nonrebreather for drop in pulse oximetry.  Objective: Vitals:   08/26/18 1300 08/26/18 1303  BP:    Pulse:    Resp:    Temp:    SpO2: (!) 88% 95%    Filed Weights   08/24/18 1855 08/25/18 0157 08/26/18 0336  Weight: 63.5 kg 65.2 kg 66.8 kg    ROS: Review of Systems  Unable to perform ROS: Acuity of condition  Respiratory: Positive for cough and shortness of breath.   Cardiovascular: Negative for chest pain.  Gastrointestinal: Negative for abdominal pain.   Exam: Physical Exam  HENT:  Nose: No mucosal edema.  Mouth/Throat: No oropharyngeal exudate or posterior oropharyngeal edema.  Eyes: Pupils are equal, round, and reactive to light. Conjunctivae, EOM and lids are normal.  Neck: No JVD present. Carotid bruit is not present. No edema present. No thyroid mass and no thyromegaly present.  Cardiovascular: S1 normal and S2 normal. Exam reveals no gallop.  No murmur heard. Pulses:      Dorsalis pedis pulses are 2+ on the right side, and 2+ on the left side.  Respiratory: No respiratory distress. He has decreased breath sounds in the right lower field and the left lower field. He has wheezes in the right upper field, the right middle field, the left middle field and the left lower field. He has rhonchi in the right lower field and the left lower field. He has no rales.  GI: Soft. Bowel sounds are normal. There is no tenderness.  Musculoskeletal:       Right ankle: He exhibits no swelling.       Left ankle: He exhibits no swelling.  Lymphadenopathy:    He has no cervical adenopathy.  Neurological: He is alert. No cranial nerve deficit.  Able to straight leg raise.  Skin: Skin is warm. No rash noted. Nails show no clubbing.   Psychiatric: He has a normal mood and affect.      Data Reviewed: Basic Metabolic Panel: Recent Labs  Lab 08/24/18 1911 08/25/18 0438 08/26/18 0641  NA 131* 133* 132*  K 3.3* 2.9* 4.2  CL 96* 96* 97*  CO2 27 28 27   GLUCOSE 117* 99 103*  BUN 35* 30* 33*  CREATININE 0.80 0.76 0.70  CALCIUM 8.1* 7.7* 7.9*  MG  --  2.0 2.1  PHOS  --   --  2.7   CBC: Recent Labs  Lab 08/24/18 1911 08/25/18 0438 08/26/18 0641  WBC 11.2* 10.3 15.0*  HGB 10.6* 9.7* 10.4*  HCT 31.7* 29.5* 31.8*  MCV 97.2 98.3 99.4  PLT 326 289 309   Cardiac Enzymes: Recent Labs  Lab 08/24/18 1911  TROPONINI 0.03*    Recent Results (from the past 240 hour(s))  Blood culture (routine x 2)     Status: None (Preliminary result)   Collection Time: 08/24/18 11:19 PM  Result Value Ref Range Status   Specimen Description BLOOD LEFT FATTY CASTS  Final   Special Requests   Final    BOTTLES DRAWN AEROBIC AND ANAEROBIC Blood Culture results may not be optimal due to an excessive volume of blood received in culture bottles   Culture   Final    NO GROWTH 2 DAYS Performed at [facility], [address] [place], [place] [address]    Report Status PENDING  Incomplete  Blood culture (routine x 2)     Status: None (Preliminary result)   Collection Time: 08/24/18 11:19 PM  Result Value Ref Range Status   Specimen Description BLOOD RIGHT FATTY CASTS  Final   Special Requests   Final    Blood Culture results may not be optimal due to an excessive volume of blood received in culture bottles   Culture   Final    NO GROWTH 2 DAYS Performed at [facility], [address] [place], [place] [address]    Report Status PENDING  Incomplete  Expectorated sputum assessment w rflx to resp cult     Status: None   Collection Time: 08/25/18  1:39 PM  Result Value Ref Range Status   Specimen Description SPUTUM  Final   Special Requests Normal  Final   Sputum evaluation   Final    THIS SPECIMEN IS  ACCEPTABLE FOR SPUTUM CULTURE Performed at [facility], [address], [place], [place] [address]    Report Status 08/25/2018 FINAL  Final  Culture, respiratory     Status: None (Preliminary result)   Collection Time: 08/25/18  1:39 PM  Result Value Ref Range Status   Specimen Description   Final    SPUTUM Performed at [facility], [address], [place], [place] [address]    Special Requests   Final    Normal Reflexed from [id number] Performed at [facility], [address] [place], [place] [address]    Gram Stain   Final    FEW WBC PRESENT, PREDOMINANTLY PMN ABUNDANT GRAM POSITIVE COCCI MODERATE GRAM NEGATIVE RODS MODERATE GRAM POSITIVE RODS    Culture   Final    CULTURE REINCUBATED FOR BETTER GROWTH Performed at [facility], [address], [place], [place] [address]    Report Status PENDING  Incomplete  Body fluid culture     Status: None (Preliminary result)   Collection Time: 08/25/18  3:27 PM  Result Value Ref Range Status   Specimen Description   Final    PLEURAL Performed at [facility], [address], [place], [place] [address]    Special Requests   Final    Normal Performed at [facility], [address] [place], [place] [address]    Gram Stain   Final    RARE WBC PRESENT,BOTH PMN AND MONONUCLEAR NO ORGANISMS SEEN Performed at [facility], [address], [place], [place] [address]    Culture PENDING  Incomplete   Report Status PENDING  Incomplete     Studies: Ct Chest W Contrast  Result Date: 08/25/2018 CLINICAL DATA:  Inpatient.  Pleural effusion.  Dyspnea.  Cough. EXAM: CT CHEST WITH CONTRAST TECHNIQUE: Multidetector CT imaging of the chest was performed during intravenous contrast administration. CONTRAST:  75mL OMNIPAQUE IOHEXOL 300 MG/ML  SOLN COMPARISON:  Chest radiograph from one day prior. FINDINGS: Cardiovascular: Normal heart size. No significant pericardial  effusion/thickening. Left anterior descending, left circumflex and right coronary atherosclerosis. Atherosclerotic nonaneurysmal thoracic aorta. Normal caliber pulmonary arteries. No central pulmonary emboli. Mediastinum/Nodes: No discrete thyroid nodules. Unremarkable esophagus. No axillary adenopathy. Mildly enlarged 1.2 cm right hilar node (series 2/image 63). No pathologically enlarged mediastinal or left hilar nodes. Lungs/Pleura: No pneumothorax. Moderate to large dependent and subpulmonic right pleural effusion. Trace dependent left pleural effusion. Complete consolidation with significant volume loss in the right lower lobe. Near  complete consolidation with significant volume loss in the right middle lobe. Patchy consolidation and volume loss throughout the dependent right upper lobe. Heterogeneous attenuation throughout the consolidated regions of the right upper and right middle lobes, without measurable/drainable pulmonary abscess or cavitary change. Mild dependent atelectasis in the left lower lobe. No discrete lung masses or significant pulmonary nodules. Upper abdomen: Granulomatous calcification at the liver dome. Musculoskeletal: No aggressive appearing focal osseous lesions. Moderate thoracic spondylosis. Healing lateral left ninth rib fracture. IMPRESSION: 1. Moderate to large dependent and subpulmonic right pleural effusion, which may be loculated. 2. Complete consolidation with significant volume loss in the right lower lobe. Near complete consolidation with significant volume loss in the right middle lobe. Patchy consolidation and volume loss in the dependent right upper lobe with heterogeneous attenuation. Favor a combination of multilobar pneumonia and atelectasis in the right lung. No cavitary change or measurable/drainable pulmonary abscess. No discrete lung mass. 3. Recommend post treatment follow-up chest CT with IV contrast to document resolution of these findings and to exclude  underlying lung mass. 4. Nonspecific mild right hilar adenopathy, probably reactive. 5. Three-vessel coronary atherosclerosis. 6. Healing lateral left ninth rib fracture. Aortic Atherosclerosis (ICD10-I70.0). Electronically Signed   By: Delbert Phenix M.D.   On: 08/25/2018 12:24   Dg Chest Port 1 View  Result Date: 08/25/2018 CLINICAL DATA:  S/p thora; EXAM: PORTABLE CHEST - 1 VIEW COMPARISON:  the previous day's study FINDINGS: No definite pneumothorax. Possible skin fold projecting over the right upper lung; pulmonary markings are seen peripherally. Interval decrease in right pleural effusion with moderate residual. Improved lung aeration overall. Coarse linear opacities at the left lung base as before, without effusion. Heart size normal for technique. Aortic Atherosclerosis (ICD10-170.0). Vertebral endplate spurring at multiple levels in the mid and lower thoracic spine. IMPRESSION: Decrease in right pleural effusion post thoracentesis. Electronically Signed   By: Corlis Leak M.D.   On: 08/25/2018 16:04   Dg Chest Portable 1 View  Result Date: 08/24/2018 CLINICAL DATA:  Increased weakness for last few months significantly worsened since lunch today, history Parkinson's EXAM: PORTABLE CHEST 1 VIEW COMPARISON:  Portable exam 1935 hours compared to 08/02/2012 FINDINGS: Enlargement of cardiac silhouette. Atherosclerotic calcification aorta. Mediastinal contours and pulmonary vascularity normal. Moderate to large RIGHT pleural effusion and atelectasis of the lower RIGHT lung. Underlying infiltrate and/or tumor not excluded in this setting. LEFT lung clear. Bones demineralized. No pneumothorax. IMPRESSION: New moderate to large RIGHT pleural effusion and significant lower RIGHT lung atelectasis, cannot exclude underlying abnormalities. Electronically Signed   By: Ulyses Southward M.D.   On: 08/24/2018 20:29   US Thoracentesis Asp Pleural Space W/img Guide  Result Date: 08/25/2018 INDICATION: Pneumonia,  shortness of breath, pleural effusion EXAM: ULTRASOUND GUIDED RIGHT THORACENTESIS MEDICATIONS: None indicated COMPLICATIONS: None immediate. PROCEDURE: An ultrasound guided thoracentesis was thoroughly discussed with the patient and questions answered. The benefits, risks, alternatives and complications were also discussed. The patient understands and wishes to proceed with the procedure. Written consent was obtained. Ultrasound was performed to localize and mark an adequate pocket of fluid in the right chest. The area was then prepped and draped in the normal sterile fashion. 1% Lidocaine was used for local anesthesia. Under ultrasound guidance a Safe-T-Centesis catheter was introduced. Thoracentesis was performed. The catheter was removed and a dressing applied. FINDINGS: A total of approximately 1 L of clear yellow fluid was removed. Samples were sent to the laboratory as requested by the clinical team. IMPRESSION: Successful ultrasound guided  right thoracentesis yielding 1 L of pleural fluid. Electronically Signed   By: Corlis Leak M.D.   On: 08/25/2018 16:05    Scheduled Meds: . carbidopa-levodopa  2 tablet Oral QID  . Carbidopa-Levodopa ER  2 tablet Oral QHS  . docusate sodium  100 mg Oral BID  . fluticasone  1 spray Each Nare Daily  . furosemide  40 mg Intravenous Once  . ipratropium-albuterol  3 mL Nebulization Q6H  . methylPREDNISolone (SOLU-MEDROL) injection  40 mg Intravenous Daily  . mirtazapine  7.5 mg Oral QHS  . multivitamin with minerals  1 tablet Oral Daily  . polyethylene glycol  17 g Oral Daily  . rOPINIRole  1 mg Oral TID   Continuous Infusions: . sodium chloride    . azithromycin 500 mg (08/25/18 1816)  . cefTRIAXone (ROCEPHIN)  IV    . metronidazole      Assessment/Plan:  1. Acute hypoxic respiratory failure, placed on nonrebreather.  Blood gas shows a pH of 7.5 PCO2 of 32 and PO2 of 61.  Patient pulse ox 96% on 100% nonrebreather hopefully can change him over to high  flow nasal cannula at lower oxygen percentage.   2. Empyema, pneumonia with large pleural effusion with high white blood cell count.  Add Flagyl since the patient has swallowing issues and continue Rocephin and Zithromax.  Appreciate Dr. Thelma Barge input and he ordered a CT-guided catheter placement into the lung for further drainage.  Patient not a candidate for thoracotomy. 3. Wheeze.  Give a dose of Lasix and dose of Solu-Medrol and nebulizer treatments. 4. Hypokalemia this was replaced 5. Weakness.   6. Parkinson's on Sinemet 7. Confusion while in the hospital likely secondary to pneumonia and pleural effusion  Code Status:     Code Status Orders  (From admission, onward)         Start     Ordered   08/25/18 1335  Do not attempt resuscitation (DNR)  Continuous    Question Answer Comment  In the event of cardiac or respiratory ARREST Do not call a "code blue"   In the event of cardiac or respiratory ARREST Do not perform Intubation, CPR, defibrillation or ACLS   In the event of cardiac or respiratory ARREST Use medication by any route, position, wound care, and other measures to relive pain and suffering. May use oxygen, suction and manual treatment of airway obstruction as needed for comfort.   Comments nurse may pronounce      08/25/18 1334        Code Status History    Date Active Date Inactive Code Status Order ID Comments User Context   08/25/2018 0200 08/25/2018 1334 Full Code [id number]  Cammy Copa, MD Inpatient    Advance Directive Documentation     Most Recent Value  Type of Advance Directive  Healthcare Power of Attorney  Pre-existing out of facility DNR order (yellow form or pink MOST form)  -  "MOST" Form in Place?  -     Family Communication: Spoke with wife at the bedside Disposition Plan: To be determined  Consultants:  CTS  Antibiotics:  Rocephin  Zithromax  Flagyl  Time spent: 32 minutes  Berl Bonfanti [employer]

## 2018-08-26 NOTE — Progress Notes (Signed)
Patient ID: Johnny Barnett, male   DOB: 07-20-1941, 77 y.o.   MRN: [id number]  Chief Complaint  Patient presents with  . Weakness    Referred By Dr. Carola Frost Reason for Referral right lung abscess with empyema  HPI Location, Quality, Duration, Severity, Timing, Context, Modifying Factors, Associated Signs and Symptoms.  Johnny Barnett is a 77 y.o. male.  He has a history of Parkinson's disease diagnosed about 7 or 8 years ago.  He has been experiencing progressive functional decline over the years which has rapidly increased over the last several months.  About 2 to 3 weeks ago he was in his usual state of health but over the last several days experienced increasing shortness of breath with cough as well as weakness.  He was able to walk with a walker a few steps but now cannot do so.  He was brought to the emergency department with those complaints.  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  He also has a history of intestinal pseudoobstruction or a volvulus.  This was treated with colonoscopy and perhaps surgery as well.  His chest x-ray in the emergency department revealed a large right-sided pleural effusion.  He did undergo a thoracentesis which reveals a fluid analysis consistent with an empyema.  The Gram stain and cultures are still pending.  He does state that he feels a little better since they drained some of the fluid off yesterday.  His shortness of breath has improved some.  He does have a cough which occasionally is productive.  He denied any fevers.   Past Medical History:  Diagnosis Date  . Degenerative joint disease   . ED (erectile dysfunction)   . Hyperlipidemia   . Hypertension   . Parkinson disease [facility])     Past Surgical History:  Procedure Laterality Date  . CATARACT EXTRACTION W/ INTRAOCULAR LENS IMPLANT Bilateral   . CYST REMOVAL TRUNK  1/16   Dr Egbert Garibaldi  . FLEXIBLE SIGMOIDOSCOPY  08/05  . Sigmoid volvulus resection  10/13   Dr Michela Pitcher    Family History  Problem Relation Age of Onset  . Diabetes Paternal Uncle   . Coronary artery disease Neg Hx   . Hypertension Neg Hx     Social History Social History   Tobacco Use  . Smoking status: Never Smoker  . Smokeless tobacco: Never Used  Substance Use Topics  . Alcohol use: Yes    Comment: wine  . Drug use: No    No Known Allergies  Current Facility-Administered Medications  Medication Dose Route Frequency Provider Last Rate Last Dose  . acetaminophen (TYLENOL) tablet 650 mg  650 mg Oral Q6H PRN Cammy Copa, MD       Or  . acetaminophen (TYLENOL) suppository 650 mg  650 mg Rectal Q6H PRN Cammy Copa, MD      . azithromycin (ZITHROMAX) 500 mg in sodium chloride 0.9 % 250 mL IVPB  500 mg Intravenous Q24H Cammy Copa, MD 250 mL/hr at 08/25/18 1816 500 mg at 08/25/18 1816  . bisacodyl (DULCOLAX) EC tablet 5 mg  5 mg Oral Daily PRN Cammy Copa, MD      . carbidopa-levodopa (SINEMET IR) 25-100 MG per tablet immediate release 2 tablet  2 tablet Oral QID Cammy Copa, MD   2 tablet at 08/25/18 1705  . Carbidopa-Levodopa ER (SINEMET CR) 25-100 MG tablet controlled release 2 tablet  2 tablet Oral QHS Cammy Copa, MD   2 tablet at  08/25/18 2133  . cefTRIAXone (ROCEPHIN) 1 g in sodium chloride 0.9 % 100 mL IVPB  1 g Intravenous Q24H Cammy Copa, MD 200 mL/hr at 08/25/18 1722 1 g at 08/25/18 1722  . chlorpheniramine-HYDROcodone (TUSSIONEX) 10-8 MG/5ML suspension 5 mL  5 mL Oral Q12H PRN Wieting, Richard, MD      . docusate sodium (COLACE) capsule 100 mg  100 mg Oral BID Cammy Copa, MD   100 mg at 08/25/18 2132  . fluticasone (FLONASE) 50 MCG/ACT nasal spray 1 spray  1 spray Each Nare Daily Cammy Copa, MD   1 spray at 08/25/18 0836  . HYDROcodone-acetaminophen (NORCO/VICODIN) 5-325 MG per tablet 1-2 tablet  1-2 tablet Oral Q4H PRN Cammy Copa, MD   1 tablet at 08/25/18 1705  . Melatonin TABS 2.5 mg  2.5 mg Oral QHS PRN Cammy Copa, MD      .  mirtazapine (REMERON) tablet 7.5 mg  7.5 mg Oral QHS Cammy Copa, MD   7.5 mg at 08/25/18 2132  . multivitamin with minerals tablet 1 tablet  1 tablet Oral Daily Cammy Copa, MD   1 tablet at 08/25/18 [phone]  . ondansetron (ZOFRAN) tablet 4 mg  4 mg Oral Q6H PRN Cammy Copa, MD       Or  . ondansetron [facility]) injection 4 mg  4 mg Intravenous Q6H PRN Cammy Copa, MD      . polyethylene glycol (MIRALAX / GLYCOLAX) packet 17 g  17 g Oral Daily Wieting, Richard, MD      . rOPINIRole (REQUIP) tablet 1 mg  1 mg Oral TID Cammy Copa, MD   1 mg at 08/25/18 2132  . senna-docusate (Senokot-S) tablet 1 tablet  1 tablet Oral Daily PRN Cammy Copa, MD      . traZODone (DESYREL) tablet 25 mg  25 mg Oral QHS PRN Cammy Copa, MD          Review of Systems A complete review of systems was asked and was negative except for the following positive findings cough, difficulty swallowing, chronic constipation, right-sided chest pain, shortness of breath.  Blood pressure (!) 160/78, pulse 83, temperature 98.5 F (36.9 C), temperature source Oral, resp. rate 18, height 5\' 7"  (1.702 m), weight 66.8 kg, SpO2 90 %.  Physical Exam CONSTITUTIONAL:  Pleasant, well-developed, well-nourished, and in no acute distress.  He was able to speak but had a stuttering speech pattern with some dyspnea. EYES: Pupils equal and reactive to light, Sclera non-icteric EARS, NOSE, MOUTH AND THROAT:  The oropharynx was clear.  Dentition is good repair.  Oral mucosa pink and moist. LYMPH NODES:  Lymph nodes in the neck and axillae were normal RESPIRATORY:  Lungs were clear.  Normal respiratory effort without pathologic use of accessory muscles of respiration CARDIOVASCULAR: Heart was regular without murmurs.  There were no carotid bruits. GI: The abdomen was soft, nontender, and markedly distended. There were no palpable masses. There was no hepatosplenomegaly. There were high-pitched bowel sounds in all quadrants. GU:  Rectal  deferred.   MUSCULOSKELETAL: Weak muscle strength and tone.  No clubbing or cyanosis.   SKIN:  There were no pathologic skin lesions.  There were no nodules on palpation. PSYCH:  Oriented to person, place and time.  Mood and affect are normal.  Data Reviewed Chest x-ray and CT scan  I have personally reviewed the patient's imaging, laboratory findings and medical records.    Assessment    I have independently reviewed the patient's CT scan and chest x-ray.  There are several lucent areas in the right midlung zone consistent with abscess.  There is a large pleural effusion which would be consistent with an empyema.  I had a long discussion today with the family about his candidacy for surgery.    Plan    The patient has significant functional deficits and we all agree that he is a very poor surgical candidate.  He is essentially bedbound now with the advanced Parkinson's disease.  If there is any way to treat his empyema short of surgery that would be ideal.  I therefore recommended that we attempt a percutaneous image guided drainage catheter placement followed by intrapleural thrombolytics if needed.  They understand that the lung abscess also may need additional drainage procedures.  I discussed his care with Dr. Carola Frost and we are in agreement with the above planned procedure.      Hulda Marin, MD 08/26/2018, 10:11 AM

## 2018-08-26 NOTE — Progress Notes (Signed)
SLP Cancellation Note  Patient Details Name: Johnny Barnett MRN: [id number] DOB: 1941-05-30   Cancelled treatment:       Reason Eval/Treat Not Completed: Patient not medically ready;Fatigue/lethargy limiting ability to participate(chart reviewed; consulted NSG then met w/ pt and Wife). Pt is a 77 y.o. male w/ h/o Parkinson's disease diagnosed about 7 or 8 years ago; HTN, DJD.  He has been experiencing progressive functional decline over the years which has rapidly increased over the last several months.  About 2 to 3 weeks ago, he experienced increasing shortness of breath with cough as well as weakness.  He was able to walk with a walker a few steps but now cannot do so.  He was brought to the emergency department with those complaints.  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  He has received multiple swallowing assessments and therpay w/ discussion re: his dysphagia and the recommendation to be on Thickened liquids in his diet - pt has declined Thickened liquids in his diet.  He also has a history of intestinal pseudoobstruction or a volvulus.  His current chest x-ray in the emergency department revealed a large right-sided pleural effusion.  He did undergo a thoracentesis which reveals a fluid analysis consistent with an empyema.  Pt was placed on a nonrebreather for drop in pulse oximetry earlier this morning.  He poorly alerts at this time calling out for his Dtr.  SLP contacted MD for consult for Palliative Care services to aid in establishing [place] as pt has baseline Dysphagia w/ Aspiration and declines Thickened liquids; his current Pulmonary status may be a result of Aspiration.  Upon speaking w/ Wife during the Palliative Care consult, Wife indicated understanding of pt's Dysphagia status, Aspiration risk, then declined Thickened liquids for him(as have been his wishes).  Recommended general aspiration precautions including use of Cup drinking; offered  education/information handouts including one on the Rije Dysphagia drink Cup as a suggested option; and offered my/SLP services if needed while pt is admitted.  Wife expressed appreciation for the information and support.  ST services will await any further request for assessment but hold on BSE at this time.  Handouts given.  NSG updated.     Orinda Kenner, MS, CCC-SLP [place] Mones 08/26/2018, 2:33 PM

## 2018-08-26 NOTE — Progress Notes (Signed)
MEDICATION RELATED CONSULT NOTE - INITIAL   Pharmacy Consult for Electrolyte Replacement Indication: Hypokalemia  No Known Allergies  Patient Measurements: Height: 5\' 7"  (170.2 cm) Weight: 147 lb 4.3 oz (66.8 kg) IBW/kg (Calculated) : 66.1 Adjusted Body Weight:    Vital Signs: Temp: 98.5 F (36.9 C) (10/17 0828) Temp Source: Oral (10/17 0828) BP: 160/78 (10/17 0828) Pulse Rate: 83 (10/17 0828) Intake/Output from previous day: 10/16 0701 - 10/17 0700 In: 1069.9 [P.O.:360; I.V.:165.8; IV Piggyback:544.1] Out: 650 [Urine:650] Intake/Output from this shift: No intake/output data recorded.  Labs: Recent Labs    08/24/18 1911 08/25/18 0438 08/26/18 0641  WBC 11.2* 10.3 15.0*  HGB 10.6* 9.7* 10.4*  HCT 31.7* 29.5* 31.8*  PLT 326 289 309  CREATININE 0.80 0.76 0.70  MG  --  2.0 2.1  PHOS  --   --  2.7   Estimated Creatinine Clearance: 72.3 mL/min (by C-G formula based on SCr of 0.7 mg/dL).   Microbiology: Recent Results (from the past 720 hour(s))  Blood culture (routine x 2)     Status: None (Preliminary result)   Collection Time: 08/24/18 11:19 PM  Result Value Ref Range Status   Specimen Description BLOOD LEFT FATTY CASTS  Final   Special Requests   Final    BOTTLES DRAWN AEROBIC AND ANAEROBIC Blood Culture results may not be optimal due to an excessive volume of blood received in culture bottles   Culture   Final    NO GROWTH 2 DAYS Performed at [facility], [address] [place], [place] [address]    Report Status PENDING  Incomplete  Blood culture (routine x 2)     Status: None (Preliminary result)   Collection Time: 08/24/18 11:19 PM  Result Value Ref Range Status   Specimen Description BLOOD RIGHT FATTY CASTS  Final   Special Requests   Final    Blood Culture results may not be optimal due to an excessive volume of blood received in culture bottles   Culture   Final    NO GROWTH 2 DAYS Performed at [facility], [address], [place], [place] [address]    Report Status PENDING  Incomplete  Expectorated sputum assessment w rflx to resp cult     Status: None   Collection Time: 08/25/18  1:39 PM  Result Value Ref Range Status   Specimen Description SPUTUM  Final   Special Requests Normal  Final   Sputum evaluation   Final    THIS SPECIMEN IS ACCEPTABLE FOR SPUTUM CULTURE Performed at [facility], [address] [place], [place] [address]    Report Status 08/25/2018 FINAL  Final  Culture, respiratory     Status: None (Preliminary result)   Collection Time: 08/25/18  1:39 PM  Result Value Ref Range Status   Specimen Description   Final    SPUTUM Performed at [facility], [address] [place], [place] [address]    Special Requests   Final    Normal Reflexed from [phone] Performed at [facility], [address] [place], [place] [address]    Gram Stain   Final    FEW WBC PRESENT, PREDOMINANTLY PMN ABUNDANT GRAM POSITIVE COCCI MODERATE GRAM NEGATIVE RODS MODERATE GRAM POSITIVE RODS    Culture   Final    CULTURE REINCUBATED FOR BETTER GROWTH Performed at [facility], [address] [place], [place] [address]    Report Status PENDING  Incomplete  Body fluid culture  Status: None (Preliminary result)   Collection Time: 08/25/18  3:27 PM  Result Value Ref Range Status   Specimen Description   Final    PLEURAL Performed at [facility], [address], [place], [place] [address]    Special Requests   Final    Normal Performed at [facility], [address] [place], [place] [address]    Gram Stain   Final    RARE WBC PRESENT,BOTH PMN AND MONONUCLEAR NO ORGANISMS SEEN Performed at [facility], [address] [place], [place] [address]    Culture PENDING  Incomplete   Report Status PENDING  Incomplete    Medical History: Past Medical History:  Diagnosis Date  . Degenerative joint disease   . ED (erectile dysfunction)    . Hyperlipidemia   . Hypertension   . Parkinson disease [facility])     Assessment: Patient is a 77yo male admitted for pneumonia. Patient was hypokalemic on admission and pharmacy was consulted for replacement.  Plan:  Electrolytes are within range today, no additional supplementation is needed at this time. Patient did get a dose of IV furosemide this afternoon so will recheck BMET with AM labs.  Clovia Cuff, PharmD, BCPS 08/26/2018 1:55 PM

## 2018-08-26 NOTE — Progress Notes (Signed)
PT Cancellation Note  Patient Details Name: DELNO BLAISDELL MRN: [id number] DOB: 05-Jan-1941   Cancelled Treatment:     Pt order received, chart reviewed. Per RN pt is fatigued and unable to mobilize in bed at this time. Pt has percutaneous image guided drainage catheter placement scheduled for this afternoon. Pt is not appropriate for evaluation this date, will attempt tomorrow.  Arvilla Meres, SPT  08/26/2018, 11:17 AM

## 2018-08-26 NOTE — Progress Notes (Signed)
Called by NT to bedside and patient's O2 sats were not maintaining. Patient was sating 84% on 4l [place]. Attempted to titrate patient up slowly, but patient's sats remained in the high 80's- placed on NRB. Resp Therapy and MD, Wieting to the patient's beside. ABG performed- Per MD, patient ok to stay on floor at this time.  Patient placed on 10L [place] nasal cannula and orders received for IV medications.

## 2018-08-26 NOTE — Procedures (Signed)
Interventional Radiology Procedure Note  Procedure: Right 54F chest tube placed in CT.   Complications: None  Estimated Blood Loss: None  Recommendations: - Wall suction overnight, transition to water seal  Signed,  Sterling Big, MD

## 2018-08-26 NOTE — Progress Notes (Signed)
Patient off the unit for procedure. Wife went down with patient.

## 2018-08-26 NOTE — Consult Note (Signed)
Consultation Note Date: 08/26/2018   Patient Name: Johnny Barnett  DOB: 09/25/1941  MRN: [id number]  Age / Sex: 77 y.o., male  PCP: Venia Carbon, MD Referring Physician: Loletha Grayer, MD  Reason for Consultation: Establishing goals of care  HPI/Patient Profile: 77 y.o. male  with past medical history of Parkinsons (diagnosed 7-8 years ago), OA, DJD, HLD, and HTN admitted on 08/24/2018 with shortness of breath and severe generalized weakness. He has been experiencing progressive weakness over the past week. He has had an unintentional weight loss of 40 pounds over the past 6 months. He was diagnosed with acute respiratory failure with hypoxia secondary to pneumonia and pleural effusion. He had 1 L removed by thoracentesis 10/16.Patient found to have empyema and planned to have CT-guided catheter placement for drainage. He is not a candidate for a thoracotomy. He has become confused during hospitalization. SLP suggested palliative care consult as patient is an aspiration risk but refuses recommended diet - thickened liquids.   Clinical Assessment and Goals of Care: I have reviewed medical records including EPIC notes, labs and imaging, received report from [place], [place], assessed the patient and then met with patient's wife, Archie Patten, to discuss diagnosis prognosis, [place], EOL wishes, disposition and options.  I introduced Palliative Medicine as specialized medical care for people living with serious illness. It focuses on providing relief from the symptoms and stress of a serious illness. The goal is to improve quality of life for both the patient and the family.  We discussed a brief life review of the patient. 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 They have 2 daughters - Ishmael Holter and [place].   As far as functional and nutritional status, wife tells me of a drastic functional decline. He has been essentially bedbound for the past week. Prior to  that, he required a walker and significant assistance for mobilization. She tells me he has not eaten well over the past week and does have problems swallowing.    We discussed his current illness and what it means in the larger context of his on-going co-morbidities.  Natural disease trajectory and expectations at EOL were discussed. Patient's wife has good understanding of Parkinson's disease process. Discusses her concerns about falls and recurrent pneumonia.  She is concerned patient will not survive hospitalization.   I attempted to elicit values and goals of care important to the patient.  She tells me how she believes the patient is at peace with dying; in fact, she feels he is ready to die. She talks of patient's lack of what he would consider good quality of life. She tells me "he has lived a good life" and speaks about his career, family, and faith.   Wife tells me how she does not want the patient to suffer any longer. She is torn between wanting him to live but also not wanting him to so that he will not suffer.   The difference between aggressive medical intervention and comfort care was considered in light of the patient's goals of care. We discussed options moving forward including rehab w/palliative, home with hospice, and comfort care in the hospital with a transition to hospice. She tells me many times how the patient wants to be at home and she is considering home with hospice but needs time to think about this. She is concerned if she will be able to provide the care he needs. She does have hired caregivers to assist her during the night and some mornings.  Advance directives, concepts specific to code status, artifical feeding and hydration, and rehospitalization were considered and discussed. Patient is DNR. He would not want life artificially prolonged, would not want feeding tube.   Hospice and Palliative Care services outpatient were explained and offered. Wife is considering  her options - interested in hospice care at home.   Patient's wife was tearful during our conversation but expressed gratitude for time to discuss patient's illness, her concerns, and plan moving forward. Emotional support provided.   Questions and concerns were addressed. The family was encouraged to call with questions or concerns.   Primary Decision Maker HCPOA/next of kin - wife, Amare Bail as patient is currently confused    SUMMARY OF RECOMMENDATIONS    - initial conversation with wife - long discussion about disease and expectations of disease process - discussed options for care moving forward  - wife is considering options but is interested in home with hospice care vs rehab with palliative - PMT will visit with wife again tomorrow to continue [place] discussion - family understands risks of aspiration with thin liquids; however, they would like to honor patient's wishes and continue with regular diet/thin liquids once patient is cleared to eat after procedure  Code Status/Advance Care Planning:  DNR   Symptom Management:   Patient denies pain and shortness of breath although his breathing does look slightly labored - would recommend morphine prn for dyspnea if needed  Wife concerned about constipation - patient has prn senna that has not been administered, will schedule  Palliative Prophylaxis:   Aspiration, Bowel Regimen, Delirium Protocol, Frequent Pain Assessment, Oral Care and Turn Reposition  Additional Recommendations (Limitations, Scope, Preferences):  Initiate Comfort Feeding and No Artificial Feeding  Psycho-social/Spiritual:   Desire for further Chaplaincy support:no  Additional Recommendations: Education on Hospice  Prognosis:   < 6 months, likely less  Discharge Planning: To Be Determined      Primary Diagnoses: Present on Admission: . CAP (community acquired pneumonia)   I have reviewed the medical record, interviewed the patient and  family, and examined the patient. The following aspects are pertinent.  Past Medical History:  Diagnosis Date  . Degenerative joint disease   . ED (erectile dysfunction)   . Hyperlipidemia   . Hypertension   . Parkinson disease [facility])    Social History   Socioeconomic History  . Marital status: Married    Spouse name: Not on file  . Number of children: 2  . Years of education: Not on file  . Highest education level: Not on file  Occupational History  . Occupation: retired- professor of history at [employer]  . Financial resource strain: Not on file  . Food insecurity:    Worry: Not on file    Inability: Not on file  . Transportation needs:    Medical: Not on file    Non-medical: Not on file  Tobacco Use  . Smoking status: Never Smoker  . Smokeless tobacco: Never Used  Substance and Sexual Activity  . Alcohol use: Yes    Comment: wine  . Drug use: No  . Sexual activity: Not on file  Lifestyle  . Physical activity:    Days per week: Not on file    Minutes per session: Not on file  . Stress: Not on file  Relationships  . Social connections:    Talks on phone: Not on file    Gets together: Not on file  Attends religious service: Not on file    Active member of club or organization: Not on file    Attends meetings of clubs or organizations: Not on file    Relationship status: Not on file  Other Topics Concern  . 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 Diabetes Paternal Uncle   . Coronary artery disease Neg Hx   . Hypertension Neg Hx    Scheduled Meds: . carbidopa-levodopa  2 tablet  Oral QID  . Carbidopa-Levodopa ER  2 tablet Oral QHS  . docusate sodium  100 mg Oral BID  . fentaNYL      . fluticasone  1 spray Each Nare Daily  . ipratropium-albuterol  3 mL Nebulization Q6H  . methylPREDNISolone (SOLU-MEDROL) injection  40 mg Intravenous Daily  . midazolam      . mirtazapine  7.5 mg Oral QHS  . multivitamin with minerals  1 tablet Oral Daily  . polyethylene glycol  17 g Oral Daily  . rOPINIRole  1 mg Oral TID   Continuous Infusions: . sodium chloride 10 mL/hr at 08/26/18 1419  . azithromycin 500 mg (08/25/18 1816)  . cefTRIAXone (ROCEPHIN)  IV    . metronidazole 500 mg (08/26/18 1422)   PRN Meds:.acetaminophen **OR** acetaminophen, bisacodyl, chlorpheniramine-HYDROcodone, HYDROcodone-acetaminophen, Melatonin, ondansetron **OR** ondansetron (ZOFRAN) IV, senna-docusate, traZODone No Known Allergies Review of Systems  Unable to perform ROS: Mental status change    Physical Exam  Constitutional: He appears lethargic. No distress.  HENT:  Head: Normocephalic and atraumatic.  Mask-like expression  Cardiovascular: Normal rate and regular rhythm.  Pulmonary/Chest: Tachypnea noted.  Mild acc muscle use  Abdominal: Soft. He exhibits distension.  Musculoskeletal: He exhibits no edema.  Neurological: He appears lethargic. He is disoriented.  Skin: Skin is warm and dry. He is not diaphoretic.  Psychiatric: Cognition and memory are impaired.    Vital Signs: BP (!) 160/78 (BP Location: Left Arm)   Pulse 83   Temp 98.5 F (36.9 C) (Oral)   Resp 18   Ht _0  (1.702 m)   Wt 66.8 kg   SpO2 95%   BMI 23.07 kg/m  Pain Scale: 0-10 POSS *See Group Information*: 1-Acceptable,Awake and alert Pain Score: 0-No pain   SpO2: SpO2: 95 % O2 Device:SpO2: 95 % O2 Flow Rate: .O2 Flow Rate (L/min): 15 L/min  IO: Intake/output summary:   Intake/Output Summary (Last 24 hours) at 08/26/2018 1518 Last data filed at 08/26/2018 0500 Gross per 24 hour  Intake -  Output 300  ml  Net -300 ml    LBM: Last BM Date: 08/26/18 Baseline Weight: Weight: 63.5 kg Most recent weight: Weight: 66.8 kg     Palliative Assessment/Data: PPS 20%     Time Total: 85 minutes Greater than 50%  of this time was spent counseling and coordinating care related to the above assessment and plan.  Juel Burrow, DNP, AGNP-C Palliative Medicine Team [phone] Pager: [phone]

## 2018-08-26 NOTE — Consult Note (Signed)
Pulmonary Medicine Consultation           Date: 08/26/2018,   MRN# [id number] Johnny Barnett 15-May-1941  Code Status:   DRN / DNI    Code Status Orders  (From admission, onward)         Start     Ordered   08/25/18 1335  Do not attempt resuscitation (DNR)  Continuous    Question Answer Comment  In the event of cardiac or respiratory ARREST Do not call a "code blue"   In the event of cardiac or respiratory ARREST Do not perform Intubation, CPR, defibrillation or ACLS   In the event of cardiac or respiratory ARREST Use medication by any route, position, wound care, and other measures to relive pain and suffering. May use oxygen, suction and manual treatment of airway obstruction as needed for comfort.   Comments nurse may pronounce      08/25/18 1334        Code Status History    Date Active Date Inactive Code Status Order ID Comments User Context   08/25/2018 0200 08/25/2018 1334 Full Code [id number]  Cammy Copa, MD Inpatient    Advance Directive Documentation     Most Recent Value  Type of Advance Directive  Healthcare Power of Attorney  Pre-existing out of facility DNR order (yellow form or pink MOST form)  -  "MOST" Form in Place?  -          AdmissionWeight: 63.5 kg                 CurrentWeight: 66.8 kg [name] is a 77 y.o. old male seen in consultation for Pleural effusion at the request of Dr.Wieting.     CHIEF COMPLAINT:   Dyspnea due to CAP and large right pleural effusion    HISTORY OF PRESENT ILLNESS   This is a pleasant 77 yo male with history of parkinsons disease, OA, HTN who was brought in from home due to cough and dyspnea for appx 1 week.  He is partially independent currently able to feed himself and walk with walker at baseline and remainder of ADL's receives assistance from wife.  Patient is moderately confused during interview and several details were obtained from daughter [name] at bedside.  She reports that her father  has progressively become more weak and more confused over past 48 hours which is a significant decrement from his usual state of health.  Family has discussed his overall chronic comorbid state and have made changed his code status to DNR/DNI but do wish to have upgraded care to MICU if necessary for close observation and aggressive medical management.    PAST MEDICAL HISTORY   Past Medical History:  Diagnosis Date  . Degenerative joint disease   . ED (erectile dysfunction)   . Hyperlipidemia   . Hypertension   . Parkinson disease [facility])      SURGICAL HISTORY   Past Surgical History:  Procedure Laterality Date  . CATARACT EXTRACTION W/ INTRAOCULAR LENS IMPLANT Bilateral   . CYST REMOVAL TRUNK  1/16   Dr Egbert Garibaldi  . FLEXIBLE SIGMOIDOSCOPY  08/05  . Sigmoid volvulus resection  10/13   Dr Michela Pitcher     FAMILY HISTORY   Family History  Problem Relation Age of Onset  . Diabetes Paternal Uncle   . Coronary artery disease Neg Hx   . Hypertension Neg Hx      SOCIAL HISTORY   Social History   Tobacco  Use  . Smoking status: Never Smoker  . Smokeless tobacco: Never Used  Substance Use Topics  . Alcohol use: Yes    Comment: wine  . Drug use: No     MEDICATIONS    Home Medication:  Reviewed today by me   Current Medication:  Current Facility-Administered Medications:  .  0.9 %  sodium chloride infusion, , Intravenous, Continuous, [name], MD, Last Rate: 10 mL/hr at 08/26/18 1419 .  acetaminophen (TYLENOL) tablet 650 mg, 650 mg, Oral, Q6H PRN **OR** acetaminophen (TYLENOL) suppository 650 mg, 650 mg, Rectal, Q6H PRN, Cammy Copa, MD .  azithromycin (ZITHROMAX) 500 mg in sodium chloride 0.9 % 250 mL IVPB, 500 mg, Intravenous, Q24H, Cammy Copa, MD, Last Rate: 250 mL/hr at 08/25/18 1816, 500 mg at 08/25/18 1816 .  bisacodyl (DULCOLAX) EC tablet 5 mg, 5 mg, Oral, Daily PRN, Cammy Copa, MD .  carbidopa-levodopa (SINEMET IR) 25-100 MG per tablet immediate release 2  tablet, 2 tablet, Oral, QID, Cammy Copa, MD, 2 tablet at 08/25/18 1705 .  Carbidopa-Levodopa ER (SINEMET CR) 25-100 MG tablet controlled release 2 tablet, 2 tablet, Oral, QHS, Cammy Copa, MD, 2 tablet at 08/25/18 2133 .  cefTRIAXone (ROCEPHIN) 1 g in sodium chloride 0.9 % 100 mL IVPB, 1 g, Intravenous, Q12H, Wieting, Richard, MD .  chlorpheniramine-HYDROcodone (TUSSIONEX) 10-8 MG/5ML suspension 5 mL, 5 mL, Oral, Q12H PRN, Wieting, Richard, MD .  docusate sodium (COLACE) capsule 100 mg, 100 mg, Oral, BID, Cammy Copa, MD, 100 mg at 08/25/18 2132 .  fluticasone (FLONASE) 50 MCG/ACT nasal spray 1 spray, 1 spray, Each Nare, Daily, Cammy Copa, MD, 1 spray at 08/25/18 [phone] .  HYDROcodone-acetaminophen (NORCO/VICODIN) 5-325 MG per tablet 1-2 tablet, 1-2 tablet, Oral, Q4H PRN, Cammy Copa, MD, 1 tablet at 08/25/18 1705 .  ipratropium-albuterol (DUONEB) 0.5-2.5 (3) MG/3ML nebulizer solution 3 mL, 3 mL, Nebulization, Q6H, Wieting, Richard, MD, 3 mL at 08/26/18 1337 .  Melatonin TABS 2.5 mg, 2.5 mg, Oral, QHS PRN, Cammy Copa, MD .  methylPREDNISolone sodium succinate (SOLU-MEDROL) 40 mg/mL injection 40 mg, 40 mg, Intravenous, Daily, Renae Gloss, Richard, MD, 40 mg at 08/26/18 1338 .  metroNIDAZOLE (FLAGYL) IVPB 500 mg, 500 mg, Intravenous, Q8H, Wieting, Richard, MD, Last Rate: 100 mL/hr at 08/26/18 1422, 500 mg at 08/26/18 1422 .  mirtazapine (REMERON) tablet 7.5 mg, 7.5 mg, Oral, QHS, Cammy Copa, MD, 7.5 mg at 08/25/18 2132 .  multivitamin with minerals tablet 1 tablet, 1 tablet, Oral, Daily, Cammy Copa, MD, 1 tablet at 08/25/18 [phone] .  ondansetron (ZOFRAN) tablet 4 mg, 4 mg, Oral, Q6H PRN **OR** ondansetron (ZOFRAN) injection 4 mg, 4 mg, Intravenous, Q6H PRN, Cammy Copa, MD .  polyethylene glycol (MIRALAX / GLYCOLAX) packet 17 g, 17 g, Oral, Daily, Wieting, Richard, MD .  rOPINIRole (REQUIP) tablet 1 mg, 1 mg, Oral, TID, Cammy Copa, MD, 1 mg at 08/25/18 2132 .  senna-docusate  (Senokot-S) tablet 1 tablet, 1 tablet, Oral, Daily, Shaffer, Luvenia Redden, NP .  traZODone (DESYREL) tablet 25 mg, 25 mg, Oral, QHS PRN, Cammy Copa, MD    ALLERGIES   Patient has no known allergies.     REVIEW OF SYSTEMS    Review of Systems:  Pt denies chest pain, denies SOB, denies leg pain, remainder of ROS unable to obtain due to patient confusion and frail state      VS: BP (!) 160/81 (BP Location: Right Arm)   Pulse 84   Temp 98 F (36.7 C) (Oral)  Resp 20   Ht 5\' 7"  (1.702 m)   Wt 66.8 kg   SpO2 99%   BMI 23.07 kg/m      PHYSICAL EXAM  Physical Examination:   GENERAL:NAD, no fevers, chills, no weakness no fatigue HEAD: Normocephalic, atraumatic.  EYES: Pupils equal, round, reactive to light. Extraocular muscles intact. No scleral icterus.  MOUTH: Moist mucosal membrane. Dentition intact. No abscess noted.  EAR, NOSE, THROAT: Clear without exudates. No external lesions.  NECK: Supple. No thyromegaly. No nodules. No JVD.  PULMONARY:  No crackles b/l , decreased air entry bilaterally worse on right, right 48F CT conducting serosang fluid actively CARDIOVASCULAR: S1 and S2. Regular rate and rhythm. No murmurs, rubs, or gallops. No edema. Pedal pulses 2+ bilaterally.  GASTROINTESTINAL: Soft, nontender, nondistended. No masses. Positive bowel sounds. No hepatosplenomegaly.  MUSCULOSKELETAL: No swelling, clubbing, or edema. Range of motion full in all extremities.  NEUROLOGIC: Cranial nerves II through XII are intact. No gross focal neurological deficits. Sensation intact. Reflexes intact.  SKIN: No ulceration, lesions, rashes, or cyanosis. Skin warm and dry. Turgor intact.  PSYCHIATRIC: Mood, affect within normal limits. The patient is awake, alert and oriented x 3. Insight, judgment intact.       IMAGING    CT chest reviewed by me today  Ct Chest W Contrast  Result Date: 08/25/2018 CLINICAL DATA:  Inpatient.  Pleural effusion.  Dyspnea.  Cough. EXAM:  CT CHEST WITH CONTRAST TECHNIQUE: Multidetector CT imaging of the chest was performed during intravenous contrast administration. CONTRAST:  75mL OMNIPAQUE IOHEXOL 300 MG/ML  SOLN COMPARISON:  Chest radiograph from one day prior. FINDINGS: Cardiovascular: Normal heart size. No significant pericardial effusion/thickening. Left anterior descending, left circumflex and right coronary atherosclerosis. Atherosclerotic nonaneurysmal thoracic aorta. Normal caliber pulmonary arteries. No central pulmonary emboli. Mediastinum/Nodes: No discrete thyroid nodules. Unremarkable esophagus. No axillary adenopathy. Mildly enlarged 1.2 cm right hilar node (series 2/image 63). No pathologically enlarged mediastinal or left hilar nodes. Lungs/Pleura: No pneumothorax. Moderate to large dependent and subpulmonic right pleural effusion. Trace dependent left pleural effusion. Complete consolidation with significant volume loss in the right lower lobe. Near complete consolidation with significant volume loss in the right middle lobe. Patchy consolidation and volume loss throughout the dependent right upper lobe. Heterogeneous attenuation throughout the consolidated regions of the right upper and right middle lobes, without measurable/drainable pulmonary abscess or cavitary change. Mild dependent atelectasis in the left lower lobe. No discrete lung masses or significant pulmonary nodules. Upper abdomen: Granulomatous calcification at the liver dome. Musculoskeletal: No aggressive appearing focal osseous lesions. Moderate thoracic spondylosis. Healing lateral left ninth rib fracture. IMPRESSION: 1. Moderate to large dependent and subpulmonic right pleural effusion, which may be loculated. 2. Complete consolidation with significant volume loss in the right lower lobe. Near complete consolidation with significant volume loss in the right middle lobe. Patchy consolidation and volume loss in the dependent right upper lobe with heterogeneous  attenuation. Favor a combination of multilobar pneumonia and atelectasis in the right lung. No cavitary change or measurable/drainable pulmonary abscess. No discrete lung mass. 3. Recommend post treatment follow-up chest CT with IV contrast to document resolution of these findings and to exclude underlying lung mass. 4. Nonspecific mild right hilar adenopathy, probably reactive. 5. Three-vessel coronary atherosclerosis. 6. Healing lateral left ninth rib fracture. Aortic Atherosclerosis (ICD10-I70.0). Electronically Signed   By: Delbert Phenix M.D.   On: 08/25/2018 12:24   Dg Chest Port 1 View  Result Date: 08/25/2018 CLINICAL DATA:  S/p thora; EXAM:  PORTABLE CHEST - 1 VIEW COMPARISON:  the previous day's study FINDINGS: No definite pneumothorax. Possible skin fold projecting over the right upper lung; pulmonary markings are seen peripherally. Interval decrease in right pleural effusion with moderate residual. Improved lung aeration overall. Coarse linear opacities at the left lung base as before, without effusion. Heart size normal for technique. Aortic Atherosclerosis (ICD10-170.0). Vertebral endplate spurring at multiple levels in the mid and lower thoracic spine. IMPRESSION: Decrease in right pleural effusion post thoracentesis. Electronically Signed   By: Corlis Leak M.D.   On: 08/25/2018 16:04   Dg Chest Portable 1 View  Result Date: 08/24/2018 CLINICAL DATA:  Increased weakness for last few months significantly worsened since lunch today, history Parkinson's EXAM: PORTABLE CHEST 1 VIEW COMPARISON:  Portable exam 1935 hours compared to 08/02/2012 FINDINGS: Enlargement of cardiac silhouette. Atherosclerotic calcification aorta. Mediastinal contours and pulmonary vascularity normal. Moderate to large RIGHT pleural effusion and atelectasis of the lower RIGHT lung. Underlying infiltrate and/or tumor not excluded in this setting. LEFT lung clear. Bones demineralized. No pneumothorax. IMPRESSION: New moderate  to large RIGHT pleural effusion and significant lower RIGHT lung atelectasis, cannot exclude underlying abnormalities. Electronically Signed   By: Ulyses Southward M.D.   On: 08/24/2018 20:29   Ct Image Guided Drainage By Percutaneous Catheter  Result Date: 08/26/2018 INDICATION: 77 year old male with right-sided empyema. EXAM: CT-guided chest tube placement MEDICATIONS: The patient is currently admitted to the hospital and receiving intravenous antibiotics. The antibiotics were administered within an appropriate time frame prior to the initiation of the procedure. ANESTHESIA/SEDATION: None COMPLICATIONS: None immediate. PROCEDURE: Informed written consent was obtained from the patient after a thorough discussion of the procedural risks, benefits and alternatives. All questions were addressed. A timeout was performed prior to the initiation of the procedure. A planning axial CT scan was performed. There is a large posterior fluid collection despite thoracentesis yesterday. A suitable skin entry site was selected and marked. The overlying skin was sterilely prepped and draped in the standard fashion using chlorhexidine skin prep. Local anesthesia was attained by infiltration with 1% lidocaine. A small dermatotomy was made. An 18 gauge trocar needle was carefully advanced into the pleural space and directed superiorly. An Amplatz wire was then advanced to the lung apex. The skin tract was dilated to 32 [place] and a Cook 12 [place] all-purpose drainage catheter modified with additional sideholes was advanced over the wire and formed. The catheter was connected to wall suction via an atrium yielding approximately 700 mL of straw-colored pleural fluid. Follow-up CT imaging demonstrates significant improvement in the volume of the pleural effusion and a well-positioned drainage catheter. The catheter was secured to the skin with 0 Prolene suture and a sterile bandage was placed. IMPRESSION: Successful placement of a 22  French right-sided pigtail thoracostomy tube modified with additional sideholes. Electronically Signed   By: [name] M.D.   On: 08/26/2018 16:59   US Thoracentesis Asp Pleural Space W/img Guide  Result Date: 08/25/2018 INDICATION: Pneumonia, shortness of breath, pleural effusion EXAM: ULTRASOUND GUIDED RIGHT THORACENTESIS MEDICATIONS: None indicated COMPLICATIONS: None immediate. PROCEDURE: An ultrasound guided thoracentesis was thoroughly discussed with the patient and questions answered. The benefits, risks, alternatives and complications were also discussed. The patient understands and wishes to proceed with the procedure. Written consent was obtained. Ultrasound was performed to localize and mark an adequate pocket of fluid in the right chest. The area was then prepped and draped in the normal sterile fashion. 1% Lidocaine was used for local  anesthesia. Under ultrasound guidance a Safe-T-Centesis catheter was introduced. Thoracentesis was performed. The catheter was removed and a dressing applied. FINDINGS: A total of approximately 1 L of clear yellow fluid was removed. Samples were sent to the laboratory as requested by the clinical team. IMPRESSION: Successful ultrasound guided right thoracentesis yielding 1 L of pleural fluid. Electronically Signed   By: Corlis Leak M.D.   On: 08/25/2018 16:05      ASSESSMENT/PLAN     ACUTE HYPOXIC RESPIRATORY FAILURE         -Likely due to compressive atelectasis of right lung secondary to large parapneumonic effusion         - recommend aggressive bronchopulmonary hygiene with Metaneb Q4h, nebulized hypesal bid, and incentive spirometer if patient able to participate.  Unlikely candidate for VEST therapy due to chest tube.          -Wean O2 as possible to keep SpO2 90-94%            RIGHT SIDED LARGE PARAPNEUMONIC EFFUSION    -Exudative neutrophil predominant suggestive of infectious process/empyema however normal pH and glucose points towards  non-infectious etiology as well   - CT chest with atelectasis and complex appearing fluid with few septated areas also suggestive of infectious process  - pleural fluid gram stain with no organisms but culture pending  -discussed tpa/dornase intrapleural infusion with family at bedside they are agreeable to this -would recommend tPA5mg /Dornase 5mg  bid X3 days, with low threshold to transfer to MICU if hemothorax/hypotension develops            Note please contact me directly if intrapleural tPA/Dornase protocol is unfamiliar  -possibly viral etiology may do nasal swab viral panel incase isolation is necessary -Chest tube evaluation -  total in atrium currently with active drainage, clamp tube if over 2L per 24 hours, no air leak noted on forced expiration     CAP    - possibly due to strep. Pneumo, vs h. Influenzae vs viral etiology such as RSV, influenza    - on empiric rocephin /zithromax , plus flagyl due to aspiration concern and soumedrol  as per primary team         Patient/Family are satisfied with Plan of action and management. All questions answered      Vida Rigger, MD  Division of Pulmonary and Critical Care Medicine 6:06 PM 08/26/2018 [phone]

## 2018-08-26 NOTE — Progress Notes (Signed)
Patient ID: Johnny Barnett, male   DOB: 1941/09/25, 77 y.o.   MRN: [id number]  [facility] PROGRESS NOTE  IZIK BINGMAN WUJ:[id number] DOB: [birth year] DOA: 08/24/2018 PCP: Karie Schwalbe, MD  HPI/Subjective: Patient feeling a little bit better.  Had 1 L drawn off from thoracentesis yesterday.  Breathing a little bit better cough a little bit less.  Was confused last night.  Had a large bowel movement last night.  Feeling very weak.  Objective: Vitals:   08/26/18 0002 08/26/18 0336  BP: (!) 154/79 (!) 155/80  Pulse: 80 83  Resp: 20 18  Temp: 97.9 F (36.6 C) 97.8 F (36.6 C)  SpO2: 94% 92%    Filed Weights   08/24/18 1855 08/25/18 0157 08/26/18 0336  Weight: 63.5 kg 65.2 kg 66.8 kg    ROS: Review of Systems  Constitutional: Negative for chills and fever.  Eyes: Negative for blurred vision.  Respiratory: Positive for cough and shortness of breath.   Cardiovascular: Negative for chest pain.  Gastrointestinal: Negative for abdominal pain, constipation, diarrhea, nausea and vomiting.  Genitourinary: Negative for dysuria.  Musculoskeletal: Negative for joint pain.  Neurological: Positive for weakness. Negative for dizziness and headaches.   Exam: Physical Exam  HENT:  Nose: No mucosal edema.  Mouth/Throat: No oropharyngeal exudate or posterior oropharyngeal edema.  Eyes: Pupils are equal, round, and reactive to light. Conjunctivae, EOM and lids are normal.  Neck: No JVD present. Carotid bruit is not present. No edema present. No thyroid mass and no thyromegaly present.  Cardiovascular: S1 normal and S2 normal. Exam reveals no gallop.  No murmur heard. Pulses:      Dorsalis pedis pulses are 2+ on the right side, and 2+ on the left side.  Respiratory: No respiratory distress. He has decreased breath sounds in the right lower field. He has no wheezes. He has rhonchi in the right lower field. He has no rales.  GI: Soft. Bowel sounds are normal. There is no tenderness.   Musculoskeletal:       Right ankle: He exhibits no swelling.       Left ankle: He exhibits no swelling.  Lymphadenopathy:    He has no cervical adenopathy.  Neurological: He is alert. No cranial nerve deficit.  Able to straight leg raise.  Skin: Skin is warm. No rash noted. Nails show no clubbing.  Psychiatric: He has a normal mood and affect.      Data Reviewed: Basic Metabolic Panel: Recent Labs  Lab 08/24/18 1911 08/25/18 0438 08/26/18 0641  NA 131* 133* 132*  K 3.3* 2.9* 4.2  CL 96* 96* 97*  CO2 27 28 27   GLUCOSE 117* 99 103*  BUN 35* 30* 33*  CREATININE 0.80 0.76 0.70  CALCIUM 8.1* 7.7* 7.9*  MG  --  2.0 2.1   CBC: Recent Labs  Lab 08/24/18 1911 08/25/18 0438  WBC 11.2* 10.3  HGB 10.6* 9.7*  HCT 31.7* 29.5*  MCV 97.2 98.3  PLT 326 289   Cardiac Enzymes: Recent Labs  Lab 08/24/18 1911  TROPONINI 0.03*    Recent Results (from the past 240 hour(s))  Blood culture (routine x 2)     Status: None (Preliminary result)   Collection Time: 08/24/18 11:19 PM  Result Value Ref Range Status   Specimen Description BLOOD LEFT FATTY CASTS  Final   Special Requests   Final    BOTTLES DRAWN AEROBIC AND ANAEROBIC Blood Culture results may not be optimal due to an excessive volume of  blood received in culture bottles   Culture   Final    NO GROWTH < 12 HOURS Performed at [facility], [address] [place], [place] [address]    Report Status PENDING  Incomplete  Blood culture (routine x 2)     Status: None (Preliminary result)   Collection Time: 08/24/18 11:19 PM  Result Value Ref Range Status   Specimen Description BLOOD RIGHT FATTY CASTS  Final   Special Requests   Final    Blood Culture results may not be optimal due to an excessive volume of blood received in culture bottles   Culture   Final    NO GROWTH < 12 HOURS Performed at [facility], [address], [place], [place] [address]    Report Status PENDING  Incomplete   Expectorated sputum assessment w rflx to resp cult     Status: None   Collection Time: 08/25/18  1:39 PM  Result Value Ref Range Status   Specimen Description SPUTUM  Final   Special Requests Normal  Final   Sputum evaluation   Final    THIS SPECIMEN IS ACCEPTABLE FOR SPUTUM CULTURE Performed at [facility], [address], [place], [place] [address]    Report Status 08/25/2018 FINAL  Final  Culture, respiratory     Status: None (Preliminary result)   Collection Time: 08/25/18  1:39 PM  Result Value Ref Range Status   Specimen Description   Final    SPUTUM Performed at [facility], [address] [place], [place] [address]    Special Requests   Final    Normal Reflexed from [id number] Performed at [facility], [address] [place], [place] [address]    Gram Stain   Final    FEW WBC PRESENT, PREDOMINANTLY PMN ABUNDANT GRAM POSITIVE COCCI MODERATE GRAM NEGATIVE RODS MODERATE GRAM POSITIVE RODS Performed at [facility], [address] [place], [place] [address]    Culture PENDING  Incomplete   Report Status PENDING  Incomplete     Studies: Ct Chest W Contrast  Result Date: 08/25/2018 CLINICAL DATA:  Inpatient.  Pleural effusion.  Dyspnea.  Cough. EXAM: CT CHEST WITH CONTRAST TECHNIQUE: Multidetector CT imaging of the chest was performed during intravenous contrast administration. CONTRAST:  75mL OMNIPAQUE IOHEXOL 300 MG/ML  SOLN COMPARISON:  Chest radiograph from one day prior. FINDINGS: Cardiovascular: Normal heart size. No significant pericardial effusion/thickening. Left anterior descending, left circumflex and right coronary atherosclerosis. Atherosclerotic nonaneurysmal thoracic aorta. Normal caliber pulmonary arteries. No central pulmonary emboli. Mediastinum/Nodes: No discrete thyroid nodules. Unremarkable esophagus. No axillary adenopathy. Mildly enlarged 1.2 cm right hilar node (series 2/image 63). No pathologically enlarged  mediastinal or left hilar nodes. Lungs/Pleura: No pneumothorax. Moderate to large dependent and subpulmonic right pleural effusion. Trace dependent left pleural effusion. Complete consolidation with significant volume loss in the right lower lobe. Near complete consolidation with significant volume loss in the right middle lobe. Patchy consolidation and volume loss throughout the dependent right upper lobe. Heterogeneous attenuation throughout the consolidated regions of the right upper and right middle lobes, without measurable/drainable pulmonary abscess or cavitary change. Mild dependent atelectasis in the left lower lobe. No discrete lung masses or significant pulmonary nodules. Upper abdomen: Granulomatous calcification at the liver dome. Musculoskeletal: No aggressive appearing focal osseous lesions. Moderate thoracic spondylosis. Healing lateral left ninth rib fracture. IMPRESSION: 1. Moderate to large dependent and subpulmonic right pleural effusion, which may be loculated. 2. Complete consolidation with significant  volume loss in the right lower lobe. Near complete consolidation with significant volume loss in the right middle lobe. Patchy consolidation and volume loss in the dependent right upper lobe with heterogeneous attenuation. Favor a combination of multilobar pneumonia and atelectasis in the right lung. No cavitary change or measurable/drainable pulmonary abscess. No discrete lung mass. 3. Recommend post treatment follow-up chest CT with IV contrast to document resolution of these findings and to exclude underlying lung mass. 4. Nonspecific mild right hilar adenopathy, probably reactive. 5. Three-vessel coronary atherosclerosis. 6. Healing lateral left ninth rib fracture. Aortic Atherosclerosis (ICD10-I70.0). Electronically Signed   By: Delbert Phenix M.D.   On: 08/25/2018 12:24   Dg Chest Port 1 View  Result Date: 08/25/2018 CLINICAL DATA:  S/p thora; EXAM: PORTABLE CHEST - 1 VIEW COMPARISON:   the previous day's study FINDINGS: No definite pneumothorax. Possible skin fold projecting over the right upper lung; pulmonary markings are seen peripherally. Interval decrease in right pleural effusion with moderate residual. Improved lung aeration overall. Coarse linear opacities at the left lung base as before, without effusion. Heart size normal for technique. Aortic Atherosclerosis (ICD10-170.0). Vertebral endplate spurring at multiple levels in the mid and lower thoracic spine. IMPRESSION: Decrease in right pleural effusion post thoracentesis. Electronically Signed   By: Corlis Leak M.D.   On: 08/25/2018 16:04   Dg Chest Portable 1 View  Result Date: 08/24/2018 CLINICAL DATA:  Increased weakness for last few months significantly worsened since lunch today, history Parkinson's EXAM: PORTABLE CHEST 1 VIEW COMPARISON:  Portable exam 1935 hours compared to 08/02/2012 FINDINGS: Enlargement of cardiac silhouette. Atherosclerotic calcification aorta. Mediastinal contours and pulmonary vascularity normal. Moderate to large RIGHT pleural effusion and atelectasis of the lower RIGHT lung. Underlying infiltrate and/or tumor not excluded in this setting. LEFT lung clear. Bones demineralized. No pneumothorax. IMPRESSION: New moderate to large RIGHT pleural effusion and significant lower RIGHT lung atelectasis, cannot exclude underlying abnormalities. Electronically Signed   By: Ulyses Southward M.D.   On: 08/24/2018 20:29   US Thoracentesis Asp Pleural Space W/img Guide  Result Date: 08/25/2018 INDICATION: Pneumonia, shortness of breath, pleural effusion EXAM: ULTRASOUND GUIDED RIGHT THORACENTESIS MEDICATIONS: None indicated COMPLICATIONS: None immediate. PROCEDURE: An ultrasound guided thoracentesis was thoroughly discussed with the patient and questions answered. The benefits, risks, alternatives and complications were also discussed. The patient understands and wishes to proceed with the procedure. Written consent  was obtained. Ultrasound was performed to localize and mark an adequate pocket of fluid in the right chest. The area was then prepped and draped in the normal sterile fashion. 1% Lidocaine was used for local anesthesia. Under ultrasound guidance a Safe-T-Centesis catheter was introduced. Thoracentesis was performed. The catheter was removed and a dressing applied. FINDINGS: A total of approximately 1 L of clear yellow fluid was removed. Samples were sent to the laboratory as requested by the clinical team. IMPRESSION: Successful ultrasound guided right thoracentesis yielding 1 L of pleural fluid. Electronically Signed   By: Corlis Leak M.D.   On: 08/25/2018 16:05    Scheduled Meds: . carbidopa-levodopa  2 tablet Oral QID  . Carbidopa-Levodopa ER  2 tablet Oral QHS  . docusate sodium  100 mg Oral BID  . fluticasone  1 spray Each Nare Daily  . mirtazapine  7.5 mg Oral QHS  . multivitamin with minerals  1 tablet Oral Daily  . polyethylene glycol  17 g Oral Daily  . rOPINIRole  1 mg Oral TID   Continuous Infusions: . azithromycin 500  mg (08/25/18 1816)  . cefTRIAXone (ROCEPHIN)  IV 1 g (08/25/18 1722)    Assessment/Plan:  1. Pneumonia with large pleural effusion with high white blood cell count.  Still awaiting pH and Gram stain.  Concern for empyema.  Called over the micro lab and they will start working on the Gram stain and culture.  Call the cytology lab and they will work on cytology.  Currently on Rocephin and Zithromax.  Case discussed with Dr. Thelma Barge cardiothoracic surgery to review his case. 2. Hypokalemia this was replaced 3. Weakness.  Physical therapy evaluation 4. Parkinson's on Sinemet 5. Confusion while in the hospital likely secondary to pneumonia and pleural effusion  Code Status:     Code Status Orders  (From admission, onward)         Start     Ordered   08/25/18 1335  Do not attempt resuscitation (DNR)  Continuous    Question Answer Comment  In the event of cardiac  or respiratory ARREST Do not call a "code blue"   In the event of cardiac or respiratory ARREST Do not perform Intubation, CPR, defibrillation or ACLS   In the event of cardiac or respiratory ARREST Use medication by any route, position, wound care, and other measures to relive pain and suffering. May use oxygen, suction and manual treatment of airway obstruction as needed for comfort.   Comments nurse may pronounce      08/25/18 1334        Code Status History    Date Active Date Inactive Code Status Order ID Comments User Context   08/25/2018 0200 08/25/2018 1334 Full Code [id number]  Cammy Copa, MD Inpatient    Advance Directive Documentation     Most Recent Value  Type of Advance Directive  Healthcare Power of Attorney  Pre-existing out of facility DNR order (yellow form or pink MOST form)  -  "MOST" Form in Place?  -     Family Communication: Spoke with wife on the phone Disposition Plan: To be determined  Consultants:  CTS  Antibiotics:  Rocephin  Zithromax  Time spent: 28 minutes  Byron Tipping [employer]

## 2018-08-26 NOTE — Progress Notes (Addendum)
Initial Nutrition Assessment  DOCUMENTATION CODES:   Non-severe (moderate) malnutrition in context of chronic illness  INTERVENTION:   RD will order supplements when diet advanced  NUTRITION DIAGNOSIS:   Moderate Malnutrition related to chronic illness(parkinsons ) as evidenced by moderate fat depletion, moderate to severe muscle depletions.  GOAL:   Patient will meet greater than or equal to 90% of their needs  MONITOR:   Labs, Diet advancement, Weight trends, Skin, I & O's  REASON FOR ASSESSMENT:   Malnutrition Screening Tool    ASSESSMENT:   77 y.o. male with h/o Parkinson's disease diagnosed about 7 or 8 years ago, intestinal pseudo obstruction or a volvulus admitted with large right-sided pleural effusion. Pt s/p thoracentesis with 1L removal 10/16 which revealed empyema.      Met with pt and pt's wife in room today. Pt very SOB today so history obtained from pt's wife at bedside. Per wife, pt with good appetite and oral intake up until the past month pta. Wife reports pt with very poor appetite and oral intake over the past month. Pt has been drinking chocolate Ensure at home and has also been eating frequent snacks (peanut butter crackers), milkshakes and ice cream to try and maintain his weight. Wife reports pt with a 20lb weight loss over the past year; wife reports pt's UBW ~165lbs. Per chart, pt has been weight stable for over a year; pt weighed 148lbs in September of last year. Pt has not weighed 165lbs in several years. Wife also reports pt with chronic aspiration, has been seen by multiple SLPs and recommended for thickened liquids. Pt does not wish to follow SLP recommendations despite knowing the risks. SLP consult pending. Pt would like to have Ensure, Magic Cups and peanut butter cracker snacks once diet advanced. Recommend daily MVI as pt takes this at home also. Pt seen by cardiothoracic surgeon today; pt is not a surgical candidate but plan for percutaneous image  guided drainage catheter today.   Medications reviewed and include: colace, remeron, MVI, miralax, azithromycin, ceftriaxone   Labs reviewed: Na 132(L), Cl 97(L), BUN 33(H), P 2.7 wnl, Mg 2.1 wnl Wbc- 15.0(H), Hct 10.4(L), Hct 31.8(L)  NUTRITION - FOCUSED PHYSICAL EXAM:    Most Recent Value  Orbital Region  Moderate depletion  Upper Arm Region  Severe depletion  Thoracic and Lumbar Region  Moderate depletion  Buccal Region  Moderate depletion  Temple Region  Moderate depletion  Clavicle Bone Region  Severe depletion  Clavicle and Acromion Bone Region  Severe depletion  Scapular Bone Region  Moderate depletion  Dorsal Hand  Moderate depletion  Patellar Region  Moderate depletion  Anterior Thigh Region  Moderate depletion  Posterior Calf Region  Moderate depletion  Edema (RD Assessment)  None  Hair  Reviewed  Eyes  Reviewed  Mouth  Reviewed  Skin  Reviewed  Nails  Reviewed     Diet Order:   Diet Order            Diet NPO time specified Except for: Ice Chips  Diet effective now             EDUCATION NEEDS:   Education needs have been addressed  Skin:  Skin Assessment: Reviewed RN Assessment(incision R chest )  Last BM:  10/16- type 7  Height:   Ht Readings from Last 1 Encounters:  08/25/18 _0  (1.702 m)    Weight:   Wt Readings from Last 1 Encounters:  08/26/18 66.8 kg    Ideal  Body Weight:  67 kg  BMI:  Body mass index is 23.07 kg/m.  Estimated Nutritional Needs:   Kcal:  1900-2200kcal/day   Protein:  100-114g/day   Fluid:  >1.7L/day   Koleen Distance MS, RD, LDN Pager #- [phone] Office#- [phone] After Hours Pager: [phone]

## 2018-08-26 NOTE — Progress Notes (Signed)
Clinical Social Worker (CSW) met with patient and his wife Archie Patten was at bedside. Per wife she would like to pursue short term rehab at a SNF for patient. CSW explained that once patient works with PT then [facility] SNF authorization can be started. CSW explained that [facility] will have to approve SNF. CSW presented bed offers to wife and [facility]. [facility] liaison is aware of accepted bed offer and that patient may have a drain at D/C. CSW will continue to follow and assist as needed.   [employer], LCSW [phone]

## 2018-08-27 ENCOUNTER — Encounter: Payer: Medicare Other | Admitting: Internal Medicine

## 2018-08-27 ENCOUNTER — Inpatient Hospital Stay: Payer: Medicare Other

## 2018-08-27 DIAGNOSIS — L8990 Pressure ulcer of unspecified site, unspecified stage: Secondary | ICD-10-CM

## 2018-08-27 LAB — CYTOLOGY - NON PAP

## 2018-08-27 LAB — BASIC METABOLIC PANEL
Anion gap: 9 (ref 5–15)
BUN: 37 mg/dL — ABNORMAL HIGH (ref 8–23)
CALCIUM: 8.1 mg/dL — AB (ref 8.9–10.3)
CO2: 28 mmol/L (ref 22–32)
Chloride: 97 mmol/L — ABNORMAL LOW (ref 98–111)
Creatinine, Ser: 0.87 mg/dL (ref 0.61–1.24)
Glucose, Bld: 146 mg/dL — ABNORMAL HIGH (ref 70–99)
POTASSIUM: 3.2 mmol/L — AB (ref 3.5–5.1)
Sodium: 134 mmol/L — ABNORMAL LOW (ref 135–145)

## 2018-08-27 LAB — BLOOD GAS, ARTERIAL
ACID-BASE EXCESS: 5.7 mmol/L — AB (ref 0.0–2.0)
Bicarbonate: 28.6 mmol/L — ABNORMAL HIGH (ref 20.0–28.0)
O2 SAT: 96.9 %
PATIENT TEMPERATURE: 37
PH ART: 7.52 — AB (ref 7.350–7.450)
pCO2 arterial: 35 mmHg (ref 32.0–48.0)
pO2, Arterial: 80 mmHg — ABNORMAL LOW (ref 83.0–108.0)

## 2018-08-27 LAB — GLUCOSE, CAPILLARY
GLUCOSE-CAPILLARY: 133 mg/dL — AB (ref 70–99)
GLUCOSE-CAPILLARY: 155 mg/dL — AB (ref 70–99)

## 2018-08-27 LAB — LACTIC ACID, PLASMA: LACTIC ACID, VENOUS: 1.3 mmol/L (ref 0.5–1.9)

## 2018-08-27 MED ORDER — MORPHINE SULFATE (PF) 2 MG/ML IV SOLN: 2.0000 mg | INTRAVENOUS | Status: DC | PRN

## 2018-08-27 MED ORDER — VANCOMYCIN HCL IN DEXTROSE 750-5 MG/150ML-% IV SOLN
750.0000 mg | Freq: Two times a day (BID) | INTRAVENOUS | Status: DC
  Administered 2018-08-28: 750 mg via INTRAVENOUS
  Filled 2018-08-27 (×2): qty 150

## 2018-08-27 MED ORDER — SODIUM CHLORIDE 0.9 % IJ SOLN
Freq: Once | INTRAMUSCULAR | Status: AC
  Administered 2018-08-27: 14:00:00 via INTRAPLEURAL
  Filled 2018-08-27: qty 10

## 2018-08-27 MED ORDER — ENSURE ENLIVE PO LIQD
237.0000 mL | Freq: Two times a day (BID) | ORAL | Status: DC
  Administered 2018-08-27: 237 mL via ORAL

## 2018-08-27 MED ORDER — MORPHINE SULFATE (PF) 2 MG/ML IV SOLN: 1.0000 mg | INTRAVENOUS | Status: DC | PRN

## 2018-08-27 MED ORDER — ORAL CARE MOUTH RINSE
15.0000 mL | Freq: Two times a day (BID) | OROMUCOSAL | Status: DC
  Administered 2018-08-27: 15 mL via OROMUCOSAL

## 2018-08-27 MED ORDER — HYDROCODONE-ACETAMINOPHEN 5-325 MG PO TABS
1.0000 | ORAL_TABLET | ORAL | Status: DC | PRN
  Administered 2018-08-27 – 2018-08-28 (×2): 1 via ORAL
  Filled 2018-08-27 (×2): qty 1

## 2018-08-27 MED ORDER — VANCOMYCIN HCL IN DEXTROSE 1-5 GM/200ML-% IV SOLN
1000.0000 mg | Freq: Once | INTRAVENOUS | Status: AC
  Administered 2018-08-27: 1000 mg via INTRAVENOUS
  Filled 2018-08-27: qty 200

## 2018-08-27 MED ORDER — POTASSIUM CHLORIDE CRYS ER 20 MEQ PO TBCR
40.0000 meq | EXTENDED_RELEASE_TABLET | Freq: Once | ORAL | Status: AC
  Administered 2018-08-27: 40 meq via ORAL
  Filled 2018-08-27: qty 2

## 2018-08-27 MED ORDER — SODIUM CHLORIDE 0.9 % IV BOLUS: 1000.0000 mL | Freq: Once | INTRAVENOUS | Status: DC

## 2018-08-27 MED ORDER — SODIUM CHLORIDE 0.9 % IV SOLN
1.0000 g | Freq: Three times a day (TID) | INTRAVENOUS | Status: DC
  Administered 2018-08-27 – 2018-08-28 (×2): 1 g via INTRAVENOUS
  Filled 2018-08-27 (×4): qty 1

## 2018-08-27 NOTE — Progress Notes (Signed)
Patient ID: Johnny Barnett, male   DOB: September 26, 1941, 77 y.o.   MRN: [id number]  [employer] [facility] PROGRESS NOTE  Johnny Barnett:[id number] DOB: Jul 10, 1941 DOA: 08/24/2018 PCP: Karie Schwalbe, MD  HPI/Subjective: Patient feeling a little bit better.  Still with some shortness of breath.  States he is bringing up some phlegm.  States he had bowel movements last night.  Objective: Vitals:   08/27/18 0221 08/27/18 0522  BP:  (!) 157/74  Pulse:  67  Resp:  20  Temp:  (!) 97.5 F (36.4 C)  SpO2: 97% 100%    Filed Weights   08/25/18 0157 08/26/18 0336 08/27/18 0522  Weight: 65.2 kg 66.8 kg 65.3 kg    ROS: Review of Systems  Constitutional: Negative for chills and fever.  Eyes: Negative for blurred vision.  Respiratory: Positive for cough and shortness of breath.   Cardiovascular: Negative for chest pain.  Gastrointestinal: Negative for abdominal pain, constipation, diarrhea, nausea and vomiting.  Genitourinary: Negative for dysuria.  Musculoskeletal: Negative for joint pain.  Neurological: Negative for dizziness and headaches.   Exam: Physical Exam  HENT:  Nose: No mucosal edema.  Mouth/Throat: No oropharyngeal exudate or posterior oropharyngeal edema.  Eyes: Pupils are equal, round, and reactive to light. Conjunctivae, EOM and lids are normal.  Neck: No JVD present. Carotid bruit is not present. No edema present. No thyroid mass and no thyromegaly present.  Cardiovascular: S1 normal and S2 normal. Exam reveals no gallop.  No murmur heard. Pulses:      Dorsalis pedis pulses are 2+ on the right side, and 2+ on the left side.  Respiratory: No respiratory distress. He has decreased breath sounds in the right middle field, the right lower field and the left lower field. He has no wheezes. He has no rhonchi. He has no rales.  GI: Soft. Bowel sounds are normal. There is no tenderness.  Musculoskeletal:       Right ankle: He exhibits no swelling.       Left ankle: He  exhibits no swelling.  Lymphadenopathy:    He has no cervical adenopathy.  Neurological: He is alert. No cranial nerve deficit.  Able to straight leg raise.  Skin: Skin is warm. No rash noted. Nails show no clubbing.  Psychiatric: He has a normal mood and affect.      Data Reviewed: Basic Metabolic Panel: Recent Labs  Lab 08/24/18 1911 08/25/18 0438 08/26/18 0641 08/27/18 0542  NA 131* 133* 132* 134*  K 3.3* 2.9* 4.2 3.2*  CL 96* 96* 97* 97*  CO2 27 28 27 28   GLUCOSE 117* 99 103* 146*  BUN 35* 30* 33* 37*  CREATININE 0.80 0.76 0.70 0.87  CALCIUM 8.1* 7.7* 7.9* 8.1*  MG  --  2.0 2.1  --   PHOS  --   --  2.7  --    CBC: Recent Labs  Lab 08/24/18 1911 08/25/18 0438 08/26/18 0641  WBC 11.2* 10.3 15.0*  HGB 10.6* 9.7* 10.4*  HCT 31.7* 29.5* 31.8*  MCV 97.2 98.3 99.4  PLT 326 289 309   Cardiac Enzymes: Recent Labs  Lab 08/24/18 1911  TROPONINI 0.03*    Recent Results (from the past 240 hour(s))  Blood culture (routine x 2)     Status: None (Preliminary result)   Collection Time: 08/24/18 11:19 PM  Result Value Ref Range Status   Specimen Description BLOOD LEFT FATTY CASTS  Final   Special Requests   Final    BOTTLES  DRAWN AEROBIC AND ANAEROBIC Blood Culture results may not be optimal due to an excessive volume of blood received in culture bottles   Culture   Final    NO GROWTH 2 DAYS Performed at [facility], [address] [place], [place] [address]    Report Status PENDING  Incomplete  Blood culture (routine x 2)     Status: None (Preliminary result)   Collection Time: 08/24/18 11:19 PM  Result Value Ref Range Status   Specimen Description BLOOD RIGHT FATTY CASTS  Final   Special Requests   Final    Blood Culture results may not be optimal due to an excessive volume of blood received in culture bottles   Culture   Final    NO GROWTH 2 DAYS Performed at [facility], [address], [place], [place] [address]    Report Status  PENDING  Incomplete  Expectorated sputum assessment w rflx to resp cult     Status: None   Collection Time: 08/25/18  1:39 PM  Result Value Ref Range Status   Specimen Description SPUTUM  Final   Special Requests Normal  Final   Sputum evaluation   Final    THIS SPECIMEN IS ACCEPTABLE FOR SPUTUM CULTURE Performed at [facility], [address] [place], [place] [address]    Report Status 08/25/2018 FINAL  Final  Culture, respiratory     Status: None (Preliminary result)   Collection Time: 08/25/18  1:39 PM  Result Value Ref Range Status   Specimen Description   Final    SPUTUM Performed at [facility], [address], [place], [place] [address]    Special Requests   Final    Normal Reflexed from [phone] Performed at [facility], [address] [place], [place] [address]    Gram Stain   Final    FEW WBC PRESENT, PREDOMINANTLY PMN ABUNDANT GRAM POSITIVE COCCI MODERATE GRAM NEGATIVE RODS MODERATE GRAM POSITIVE RODS    Culture   Final    CULTURE REINCUBATED FOR BETTER GROWTH Performed at [facility], [address] [place], [place] [address]    Report Status PENDING  Incomplete  Body fluid culture     Status: None (Preliminary result)   Collection Time: 08/25/18  3:27 PM  Result Value Ref Range Status   Specimen Description   Final    PLEURAL Performed at [facility], [address] [place], [place] [address]    Special Requests   Final    Normal Performed at [facility], [address] [place], [place] [address]    Gram Stain   Final    RARE WBC PRESENT,BOTH PMN AND MONONUCLEAR NO ORGANISMS SEEN Performed at [facility], [address], [place], [place] [address]    Culture PENDING  Incomplete   Report Status PENDING  Incomplete     Studies: Ct Chest W Contrast  Result Date: 08/25/2018 CLINICAL DATA:  Inpatient.  Pleural effusion.  Dyspnea.  Cough. EXAM: CT CHEST WITH CONTRAST TECHNIQUE:  Multidetector CT imaging of the chest was performed during intravenous contrast administration. CONTRAST:  75mL OMNIPAQUE IOHEXOL 300 MG/ML  SOLN COMPARISON:  Chest radiograph from one day prior. FINDINGS: Cardiovascular: Normal heart size. No significant pericardial effusion/thickening. Left anterior descending, left circumflex and right coronary atherosclerosis. Atherosclerotic nonaneurysmal thoracic aorta. Normal caliber pulmonary arteries. No central pulmonary emboli. Mediastinum/Nodes: No discrete thyroid nodules. Unremarkable esophagus. No axillary adenopathy. Mildly enlarged 1.2 cm right hilar  node (series 2/image 63). No pathologically enlarged mediastinal or left hilar nodes. Lungs/Pleura: No pneumothorax. Moderate to large dependent and subpulmonic right pleural effusion. Trace dependent left pleural effusion. Complete consolidation with significant volume loss in the right lower lobe. Near complete consolidation with significant volume loss in the right middle lobe. Patchy consolidation and volume loss throughout the dependent right upper lobe. Heterogeneous attenuation throughout the consolidated regions of the right upper and right middle lobes, without measurable/drainable pulmonary abscess or cavitary change. Mild dependent atelectasis in the left lower lobe. No discrete lung masses or significant pulmonary nodules. Upper abdomen: Granulomatous calcification at the liver dome. Musculoskeletal: No aggressive appearing focal osseous lesions. Moderate thoracic spondylosis. Healing lateral left ninth rib fracture. IMPRESSION: 1. Moderate to large dependent and subpulmonic right pleural effusion, which may be loculated. 2. Complete consolidation with significant volume loss in the right lower lobe. Near complete consolidation with significant volume loss in the right middle lobe. Patchy consolidation and volume loss in the dependent right upper lobe with heterogeneous attenuation. Favor a combination of  multilobar pneumonia and atelectasis in the right lung. No cavitary change or measurable/drainable pulmonary abscess. No discrete lung mass. 3. Recommend post treatment follow-up chest CT with IV contrast to document resolution of these findings and to exclude underlying lung mass. 4. Nonspecific mild right hilar adenopathy, probably reactive. 5. Three-vessel coronary atherosclerosis. 6. Healing lateral left ninth rib fracture. Aortic Atherosclerosis (ICD10-I70.0). Electronically Signed   By: Delbert Phenix M.D.   On: 08/25/2018 12:24   Dg Chest Port 1 View  Result Date: 08/25/2018 CLINICAL DATA:  S/p thora; EXAM: PORTABLE CHEST - 1 VIEW COMPARISON:  the previous day's study FINDINGS: No definite pneumothorax. Possible skin fold projecting over the right upper lung; pulmonary markings are seen peripherally. Interval decrease in right pleural effusion with moderate residual. Improved lung aeration overall. Coarse linear opacities at the left lung base as before, without effusion. Heart size normal for technique. Aortic Atherosclerosis (ICD10-170.0). Vertebral endplate spurring at multiple levels in the mid and lower thoracic spine. IMPRESSION: Decrease in right pleural effusion post thoracentesis. Electronically Signed   By: Corlis Leak M.D.   On: 08/25/2018 16:04   Ct Image Guided Drainage By Percutaneous Catheter  Result Date: 08/26/2018 INDICATION: 77 year old male with right-sided empyema. EXAM: CT-guided chest tube placement MEDICATIONS: The patient is currently admitted to the hospital and receiving intravenous antibiotics. The antibiotics were administered within an appropriate time frame prior to the initiation of the procedure. ANESTHESIA/SEDATION: None COMPLICATIONS: None immediate. PROCEDURE: Informed written consent was obtained from the patient after a thorough discussion of the procedural risks, benefits and alternatives. All questions were addressed. A timeout was performed prior to the  initiation of the procedure. A planning axial CT scan was performed. There is a large posterior fluid collection despite thoracentesis yesterday. A suitable skin entry site was selected and marked. The overlying skin was sterilely prepped and draped in the standard fashion using chlorhexidine skin prep. Local anesthesia was attained by infiltration with 1% lidocaine. A small dermatotomy was made. An 18 gauge trocar needle was carefully advanced into the pleural space and directed superiorly. An Amplatz wire was then advanced to the lung apex. The skin tract was dilated to 49 [place] and a Cook 12 [place] all-purpose drainage catheter modified with additional sideholes was advanced over the wire and formed. The catheter was connected to wall suction via an atrium yielding approximately 700 mL of straw-colored pleural fluid. Follow-up CT imaging demonstrates significant improvement in  the volume of the pleural effusion and a well-positioned drainage catheter. The catheter was secured to the skin with 0 Prolene suture and a sterile bandage was placed. IMPRESSION: Successful placement of a 22 French right-sided pigtail thoracostomy tube modified with additional sideholes. Electronically Signed   By: [name] M.D.   On: 08/26/2018 16:59   US Thoracentesis Asp Pleural Space W/img Guide  Result Date: 08/25/2018 INDICATION: Pneumonia, shortness of breath, pleural effusion EXAM: ULTRASOUND GUIDED RIGHT THORACENTESIS MEDICATIONS: None indicated COMPLICATIONS: None immediate. PROCEDURE: An ultrasound guided thoracentesis was thoroughly discussed with the patient and questions answered. The benefits, risks, alternatives and complications were also discussed. The patient understands and wishes to proceed with the procedure. Written consent was obtained. Ultrasound was performed to localize and mark an adequate pocket of fluid in the right chest. The area was then prepped and draped in the normal sterile fashion. 1%  Lidocaine was used for local anesthesia. Under ultrasound guidance a Safe-T-Centesis catheter was introduced. Thoracentesis was performed. The catheter was removed and a dressing applied. FINDINGS: A total of approximately 1 L of clear yellow fluid was removed. Samples were sent to the laboratory as requested by the clinical team. IMPRESSION: Successful ultrasound guided right thoracentesis yielding 1 L of pleural fluid. Electronically Signed   By: Corlis Leak M.D.   On: 08/25/2018 16:05    Scheduled Meds: . alteplase (tPA) 10mg  in NS 40mL for Dr.Oaks (intrapleural administration/[facility])   Intrapleural Once  . carbidopa-levodopa  2 tablet Oral QID  . Carbidopa-Levodopa ER  2 tablet Oral QHS  . docusate sodium  100 mg Oral BID  . fluticasone  1 spray Each Nare Daily  . ipratropium-albuterol  3 mL Nebulization Q6H  . mouth rinse  15 mL Mouth Rinse BID  . mirtazapine  7.5 mg Oral QHS  . multivitamin with minerals  1 tablet Oral Daily  . polyethylene glycol  17 g Oral Daily  . rOPINIRole  1 mg Oral TID  . senna-docusate  1 tablet Oral Daily   Continuous Infusions: . sodium chloride Stopped (08/27/18 0557)  . azithromycin Stopped (08/26/18 1950)  . cefTRIAXone (ROCEPHIN)  IV Stopped (08/27/18 0126)  . metronidazole 100 mL/hr at 08/27/18 0601    Assessment/Plan:  1. Acute hypoxic respiratory failure.  Breathing a little bit more comfortably today after chest tube placement.  Continue high flow nasal cannula and taper as we can. 2. Empyema, pneumonia with large pleural effusion with high white blood cell count.  Continue Rocephin, Zithromax and Flagyl.  Appreciate Dr. Thelma Barge consult and management of chest tube.  Chest tube placed yesterday afternoon by interventional radiology 3. Hypokalemia replace IV. 4. Weakness.  Physical therapy consultation 5. Parkinson's on Sinemet 6. Confusion while in the hospital likely secondary to pneumonia and pleural effusion.  Patient stated he slept last night and  answering questions appropriately this morning 7. Patient n.p.o. at this time.  Asked the nurse to confirm with speech pathology about the diet.  History of swallowing issues with Parkinson's. 8. Appreciate palliative care consultation.  Code Status:     Code Status Orders  (From admission, onward)         Start     Ordered   08/25/18 1335  Do not attempt resuscitation (DNR)  Continuous    Question Answer Comment  In the event of cardiac or respiratory ARREST Do not call a "code blue"   In the event of cardiac or respiratory ARREST Do not perform Intubation, CPR, defibrillation or  ACLS   In the event of cardiac or respiratory ARREST Use medication by any route, position, wound care, and other measures to relive pain and suffering. May use oxygen, suction and manual treatment of airway obstruction as needed for comfort.   Comments nurse may pronounce      08/25/18 1334        Code Status History    Date Active Date Inactive Code Status Order ID Comments User Context   08/25/2018 0200 08/25/2018 1334 Full Code [id number]  Cammy Copa, MD Inpatient    Advance Directive Documentation     Most Recent Value  Type of Advance Directive  Healthcare Power of Attorney  Pre-existing out of facility DNR order (yellow form or pink MOST form)  -  "MOST" Form in Place?  -     Family Communication: Left message for wife on the phone Disposition Plan: To be determined  Consultants:  CTS  Antibiotics:  Rocephin  Zithromax  Flagyl  Time spent: 25 minutes  Tashawn Laswell [employer]

## 2018-08-27 NOTE — Care Management Important Message (Signed)
Copy of signed IM left with patient in room.  

## 2018-08-27 NOTE — Progress Notes (Signed)
Daily Progress Note   Patient Name: Johnny Barnett       Date: 08/27/2018 DOB: [birth year]  Age: 77 y.o. MRN#: [id number] Attending Physician: Alford Highland, MD Primary Care Physician: Karie Schwalbe, MD Admit Date: 08/24/2018  Reason for Consultation/Follow-up: Establishing goals of care  Subjective: Patient less responsive during my assessment, does not respond to me  Length of Stay: 2  Current Medications: Scheduled Meds:  . alteplase (tPA) 10mg  in NS 40mL for Dr.Oaks (intrapleural administration/[facility])   Intrapleural Once  . carbidopa-levodopa  2 tablet Oral QID  . Carbidopa-Levodopa ER  2 tablet Oral QHS  . docusate sodium  100 mg Oral BID  . feeding supplement (ENSURE ENLIVE)  237 mL Oral BID BM  . fluticasone  1 spray Each Nare Daily  . ipratropium-albuterol  3 mL Nebulization Q6H  . mouth rinse  15 mL Mouth Rinse BID  . mirtazapine  7.5 mg Oral QHS  . multivitamin with minerals  1 tablet Oral Daily  . polyethylene glycol  17 g Oral Daily  . rOPINIRole  1 mg Oral TID  . senna-docusate  1 tablet Oral Daily    Continuous Infusions: . sodium chloride Stopped (08/27/18 0557)  . azithromycin Stopped (08/26/18 1950)  . cefTRIAXone (ROCEPHIN)  IV 1 g (08/27/18 1035)  . metronidazole 100 mL/hr at 08/27/18 0601    PRN Meds: acetaminophen **OR** acetaminophen, bisacodyl, chlorpheniramine-HYDROcodone, HYDROcodone-acetaminophen, Melatonin, morphine injection, ondansetron **OR** ondansetron (ZOFRAN) IV, traZODone  Physical Exam         Constitutional: He appears lethargic. No distress.  HENT:  Head: Normocephalic and atraumatic.  Mask-like expression  Cardiovascular: Normal rate and regular rhythm.  Pulmonary/Chest: Tachypnea noted.  Mild acc muscle use  Abdominal: Soft. He  exhibits distension.  Musculoskeletal: He exhibits no edema.  Neurological: He appears lethargic. He is disoriented.  Skin: Skin is warm and dry. He is not diaphoretic.  Psychiatric: Cognition and memory are impaired.   Vital Signs: BP (!) 144/61 (BP Location: Right Arm)   Pulse 74   Temp 97.6 F (36.4 C) (Oral)   Resp 18   Ht 5\' 7"  (1.702 m)   Wt 65.3 kg   SpO2 99%   BMI 22.54 kg/m  SpO2: SpO2: 99 % O2 Device: O2 Device: High Flow Nasal Cannula O2 Flow Rate: O2 Flow Rate (L/min): 10 L/min  Intake/output summary:   Intake/Output Summary (Last 24 hours) at 08/27/2018 1440 Last data filed at 08/27/2018 4696 Gross per 24 hour  Intake 829.8 ml  Output 10 ml  Net 819.8 ml   LBM: Last BM Date: 08/26/18 Baseline Weight: Weight: 63.5 kg Most recent weight: Weight: 65.3 kg       Palliative Assessment/Data: PPS 20%    Flowsheet Rows     Most Recent Value  Intake Tab  Referral Department  Hospitalist  Unit at Time of Referral  Med/Surg Unit  Palliative Care Primary Diagnosis  Neurology  Date Notified  08/26/18  Palliative Care Type  New Palliative care  Reason for referral  Clarify Goals of Care  Date of Admission  08/24/18  Date first seen by Palliative Care  08/26/18  # of days Palliative referral response time  0  Day(s)  # of days IP prior to Palliative referral  2  Clinical Assessment  Palliative Performance Scale Score  20%  Psychosocial & Spiritual Assessment  Palliative Care Outcomes  Patient/Family meeting held?  Yes  Who was at the meeting?  wife  Palliative Care Outcomes  Clarified goals of care, Provided psychosocial or spiritual support, Counseled regarding hospice, Provided advance care planning      Patient Active Problem List   Diagnosis Date Noted  . Pressure injury of skin 08/27/2018  . Empyema (HCC)   . Palliative care by specialist   . Goals of care, counseling/discussion   . CAP (community acquired pneumonia) 08/25/2018  . Orthostasis  07/14/2018  . Vision abnormalities 01/21/2018  . Malnutrition of moderate degree (HCC) 08/24/2017  . Advance directive discussed with patient 08/15/2015  . Actinic keratosis 12/02/2012  . Routine general medical examination at a health care facility 07/23/2012  . Other constipation 09/09/2011  . Parkinson disease (HCC) 07/07/2011  . HTN (hypertension) 05/30/2011  . Memory loss 10/16/2010  . HYPERLIPIDEMIA 05/17/2007  . ERECTILE DYSFUNCTION 05/17/2007  . DEGENERATIVE JOINT DISEASE 05/17/2007    Palliative Care Assessment & Plan   HPI: 77 y.o. male  with past medical history of Parkinsons (diagnosed 7-8 years ago), OA, DJD, HLD, and HTN admitted on 08/24/2018 with shortness of breath and severe generalized weakness. He has been experiencing progressive weakness over the past week. He has had an unintentional weight loss of 40 pounds over the past 6 months. He was diagnosed with acute respiratory failure with hypoxia secondary to pneumonia and pleural effusion. He had 1 L removed by thoracentesis 10/16.Patient found to have empyema and planned to have CT-guided catheter placement for drainage. He is not a candidate for a thoracotomy. He has become confused during hospitalization. SLP suggested palliative care consult as patient is an aspiration risk but refuses recommended diet - thickened liquids.   Assessment: During my assessment, patient less responsive. Daughter at bedside who reports patient has been hallucinating more and is not eating today. Remains on 10L O2 via [place]. Breathing appears less labored.  Called patient's wife, Johnny Barnett, to provide clinical update and continue GOC discussion. Johnny Barnett immediately tells me she does not want to pursue rehab. She understands the patient is very sick and nearing end of life. We discussed patient's poor functional status and poor nutrition. Patient is unable to get out of bed. He has difficulty swallowing. Johnny Barnett is interested in hospice support - we  discussed hospice home vs hospice at patient's home vs hospice at facility. Johnny Barnett is interested in hospice home or receiving hospice care at home. We discussed that typically patient's go to hospice home when prognosis is <2 weeks. Johnny Barnett would like to continue current care and reevaluate patient over the weekend. If he continues to decline (no improvement in oxygen requirement, not eating, increased lethargy) then she would like to pursue hospice home and I think he would be eligible if he continues to decline. Discussed this with Dr. Renae Gloss.  Johnny Barnett again discusses her peace about patient nearing the end of life because she thinks he is "ready" and feels a peace about dying.   Recommendations/Plan:  Continue current care - antibiotics, chest tube with TPA - reevaluate patient over the weekend -  If he continues to decline (no improvement in oxygen requirement, not eating, increased lethargy) then pursue hospice home placement  Wife no longer interested in rehab - family understands risks of aspiration with thin liquids; however, they would  like to honor patient's wishes and continue with regular diet/thin liquids once patient is cleared to eat after procedure  - PMT to follow up next week if patient is still here  Code Status:  DNR  Prognosis:   Unable to determine - pending clinical course -  Not surprising if prognosis is < 2 weeks  Discharge Planning:  To Be Determined: hospice facility vs home w/hospice  Care plan was discussed with Dr. Renae Gloss, patient's wife  Thank you for allowing the Palliative Medicine Team to assist in the care of this patient.   Total Time 35 minutes Prolonged Time Billed  no       Greater than 50%  of this time was spent counseling and coordinating care related to the above assessment and plan.  Gerlean Ren, DNP, [facility] Palliative Medicine Team Team Phone # [phone]  Pager [phone]

## 2018-08-27 NOTE — Progress Notes (Signed)
  Today at approximately 1:30 PM I instilled 10 mg of TPA into the patient's right-sided chest tube.  He tolerated that well.  We will leave the tube clamped for 4 hours and then place it back to suction.

## 2018-08-27 NOTE — Progress Notes (Addendum)
Nurse notified by charge nurse that patient's wife came out and stated the patient was having chills. Charger nurse checked o2 sats and they were in the 80's, but after having blankets placed on him his sat's went back up in the 90's. Upon nurse entering room patient was having chills, vitals were taken and documented in flowsheets with BP abnormally high with all other vitals being in range, nurse paged prime doc. Patient stated " I just don't feel good, no pain" nurse asked patient if he felt cold and patient stated " yes". Spoke with Dr. Katheren Shams about the above. Per Dr. Katheren Shams continue to monitor and cover with warm blankets if condition worsens call him back.

## 2018-08-27 NOTE — Progress Notes (Signed)
  Patient ID: Johnny Barnett, male   DOB: 1941/08/17, 77 y.o.   MRN: [id number]  HISTORY: This morning he appears somewhat confused.  He does not appear to be in any distress however.   Vitals:   08/27/18 0221 08/27/18 0522  BP:  (!) 157/74  Pulse:  67  Resp:  20  Temp:  (!) 97.5 F (36.4 C)  SpO2: 97% 100%     EXAM:    Resp: Lungs are clear bilaterally.  No respiratory distress, normal effort. Heart:  Regular without murmurs  There is no air leak from the tube.  The drainage has been about 700 cc since it was placed.  Independent review of his chest x-ray shows a consolidated area in the right midlung zone.  ASSESSMENT: Right-sided pneumonia with empyema.   PLAN:   We will try intrapleural thrombolytics today.  He remains a very poor candidate for any surgical intervention.    Hulda Marin, MDPatient ID: Johnny Barnett, male   DOB: 04-16-1941, 77 y.o.   MRN: [id number]

## 2018-08-27 NOTE — Progress Notes (Signed)
Pharmacy Antibiotic Note  Johnny Barnett is a 77 y.o. male admitted on 08/24/2018 with pneumonia.  Pharmacy has been consulted for Vancomycin, Meropenem dosing.  Plan:  CrCl = 65.7 ml/min Ke = 0.06 hr-1 T1/2 = 11.6 hrs Vd = 45.7 L   Meropenem 1 gm IV Q8H ordered to start on 10/18 @ 23:00.  Vancomycin 1 gm IV X 1 ordered to be given on 10/18 @ 23:00. Vancomycin 750 mg IV Q12H ordered to start on 10/19 @ 0500, ~ 6 hrs after 1st dose (stacked dosing). This pt will reach Css by 10/21 @ 11:00 Will draw 1st trough on 10/21 @ 0430, which will be very close to Css.   Height: 5\' 7"  (170.2 cm) Weight: 143 lb 14.4 oz (65.3 kg) IBW/kg (Calculated) : 66.1  Temp (24hrs), Avg:97.9 F (36.6 C), Min:97.5 F (36.4 C), Max:98.4 F (36.9 C)  Recent Labs  Lab 08/24/18 1911 08/25/18 0438 08/26/18 0641 08/27/18 0542  WBC 11.2* 10.3 15.0*  --   CREATININE 0.80 0.76 0.70 0.87  LATICACIDVEN 0.8  --   --   --     Estimated Creatinine Clearance: 65.7 mL/min (by C-G formula based on SCr of 0.87 mg/dL).    No Known Allergies  Antimicrobials this admission:   >>   >>   Dose adjustments this admission:   Microbiology results:  BCx:   UCx:    Sputum:    MRSA PCR:   Thank you for allowing pharmacy to be a part of this patient's care.  Hanako Tipping D 08/27/2018 10:44 PM

## 2018-08-27 NOTE — Evaluation (Signed)
Physical Therapy Evaluation Patient Details Name: Johnny Barnett MRN: [id number] DOB: 17-Dec-1940 Today's Date: 08/27/2018   History of Present Illness  Pt admitted for community acquired pneumonia with complaints of weakness. History includes OA, Parkinson,s and hyperlipidemia. Of note, chest tube placed yesterday for drainage.  Clinical Impression  Pt is a pleasant 77 year old male who was admitted for CAP and is now s/p chest tube placement for drainage. Pt performs bed mobility with max assist for rolling and unable to further perform OOB mobility. Pt demonstrates deficits with cognition/strength/mobility. Pt currently on HFNC with sats at 98%. Pt able to give some history, however not sure of accurate historian due to lethargy. Pt is currently not at baseline level. Would benefit from skilled PT to address above deficits and promote optimal return to PLOF; recommend transition to STR upon discharge from acute hospitalization.       Follow Up Recommendations SNF(per family discussion-may be interested in hospice)    Equipment Recommendations  None recommended by PT    Recommendations for Other Services       Precautions / Restrictions Precautions Precautions: Fall Restrictions Weight Bearing Restrictions: No      Mobility  Bed Mobility Overal bed mobility: Needs Assistance Bed Mobility: Rolling Rolling: Max assist         General bed mobility comments: Able to initiate reaching to B sides, however needs max assist for rolling to sidelying to B sides. Unable to progress to OOB mobility at this time  Transfers                 General transfer comment: unable  Ambulation/Gait                Stairs            Wheelchair Mobility    Modified Rankin (Stroke Patients Only)       Balance                                             Pertinent Vitals/Pain Pain Assessment: Faces Faces Pain Scale: Hurts a little bit Pain  Location: chest from tube Pain Descriptors / Indicators: Operative site guarding;Grimacing Pain Intervention(s): Limited activity within patient's tolerance    Home Living Family/patient expects to be discharged to:: Private residence Living Arrangements: Spouse/significant other Available Help at Discharge: Family;Personal care attendant(has PCA 11hr/day) Type of Home: House Home Access: Stairs to enter(has stair lift)     Home Layout: Able to live on main level with bedroom/bathroom Home Equipment: Walker - 2 wheels;Wheelchair - manual;Cane - single point      Prior Function Level of Independence: Needs assistance   Gait / Transfers Assistance Needed: per patient, he was ambulatory and able to ambulate approx 100' at baseline. Per chart, pt has had severe functional decline over the past few weeks and has been bedbound for over a week due to weakness.  ADL's / Homemaking Assistance Needed: Has wife and PCA for assistance.        Hand Dominance        Extremity/Trunk Assessment   Upper Extremity Assessment Upper Extremity Assessment: Generalized weakness(B UE grossly 3/5)    Lower Extremity Assessment Lower Extremity Assessment: Generalized weakness(B LE grossly 2/5)       Communication   Communication: No difficulties  Cognition Arousal/Alertness: Lethargic Behavior During Therapy: WFL for tasks assessed/performed Overall  Cognitive Status: Impaired/Different from baseline                                 General Comments: oriented to self, and some history, however appears confused to situation      General Comments      Exercises Other Exercises Other Exercises: supine ther-ex performed with mod assist on B LE including ankle pumps, SLRs, and heel slides. All ther-ex performed x 10 reps and pt reports it feels good to move legs   Assessment/Plan    PT Assessment Patient needs continued PT services  PT Problem List Decreased  strength;Decreased activity tolerance;Decreased balance;Decreased mobility;Decreased cognition       PT Treatment Interventions Therapeutic activities;Therapeutic exercise;Balance training;DME instruction;Gait training    PT Goals (Current goals can be found in the Care Plan section)  Acute Rehab PT Goals Patient Stated Goal: to get better PT Goal Formulation: Patient unable to participate in goal setting Time For Goal Achievement: 09/10/18 Potential to Achieve Goals: Fair    Frequency Min 2X/week   Barriers to discharge        Co-evaluation               AM-PAC PT "6 Clicks" Daily Activity  Outcome Measure Difficulty turning over in bed (including adjusting bedclothes, sheets and blankets)?: Unable Difficulty moving from lying on back to sitting on the side of the bed? : Unable Difficulty sitting down on and standing up from a chair with arms (e.g., wheelchair, bedside commode, etc,.)?: Unable Help needed moving to and from a bed to chair (including a wheelchair)?: Total Help needed walking in hospital room?: Total Help needed climbing 3-5 steps with a railing? : Total 6 Click Score: 6    End of Session Equipment Utilized During Treatment: Oxygen Activity Tolerance: Patient tolerated treatment well Patient left: in bed;with bed alarm set Nurse Communication: Mobility status PT Visit Diagnosis: Muscle weakness (generalized) (M62.81);Difficulty in walking, not elsewhere classified (R26.2)    Time: 4098-1191 PT Time Calculation (min) (ACUTE ONLY): 14 min   Charges:   PT Evaluation $PT Eval Low Complexity: 1 Low          Elizabeth Palau, PT, DPT [phone]   Cantrell Larouche 08/27/2018, 11:21 AM

## 2018-08-27 NOTE — Progress Notes (Signed)
MEDICATION RELATED CONSULT NOTE - INITIAL   Pharmacy Consult for Electrolyte Replacement Indication: Hypokalemia  No Known Allergies  Patient Measurements: Height: 5\' 7"  (170.2 cm) Weight: 143 lb 14.4 oz (65.3 kg) IBW/kg (Calculated) : 66.1 Adjusted Body Weight:    Vital Signs: Temp: 97.5 F (36.4 C) (10/18 0522) Temp Source: Oral (10/18 0522) BP: 157/74 (10/18 0522) Pulse Rate: 72 (10/18 0832) Intake/Output from previous day: 10/17 0701 - 10/18 0700 In: 829.8 [I.V.:52.4; IV Piggyback:777.4] Out: 10 [Chest Tube:10] Intake/Output from this shift: No intake/output data recorded.  Labs: Recent Labs    08/24/18 1911 08/25/18 0438 08/26/18 0641 08/27/18 0542  WBC 11.2* 10.3 15.0*  --   HGB 10.6* 9.7* 10.4*  --   HCT 31.7* 29.5* 31.8*  --   PLT 326 289 309  --   CREATININE 0.80 0.76 0.70 0.87  MG  --  2.0 2.1  --   PHOS  --   --  2.7  --    Estimated Creatinine Clearance: 65.7 mL/min (by C-G formula based on SCr of 0.87 mg/dL).   Microbiology: Recent Results (from the past 720 hour(s))  Blood culture (routine x 2)     Status: None (Preliminary result)   Collection Time: 08/24/18 11:19 PM  Result Value Ref Range Status   Specimen Description BLOOD LEFT FATTY CASTS  Final   Special Requests   Final    BOTTLES DRAWN AEROBIC AND ANAEROBIC Blood Culture results may not be optimal due to an excessive volume of blood received in culture bottles   Culture   Final    NO GROWTH 3 DAYS Performed at [facility], [address] [place], [place] [address]    Report Status PENDING  Incomplete  Blood culture (routine x 2)     Status: None (Preliminary result)   Collection Time: 08/24/18 11:19 PM  Result Value Ref Range Status   Specimen Description BLOOD RIGHT FATTY CASTS  Final   Special Requests   Final    Blood Culture results may not be optimal due to an excessive volume of blood received in culture bottles   Culture   Final    NO GROWTH 3 DAYS Performed at  [facility], [address], [place], [place] [address]    Report Status PENDING  Incomplete  Expectorated sputum assessment w rflx to resp cult     Status: None   Collection Time: 08/25/18  1:39 PM  Result Value Ref Range Status   Specimen Description SPUTUM  Final   Special Requests Normal  Final   Sputum evaluation   Final    THIS SPECIMEN IS ACCEPTABLE FOR SPUTUM CULTURE Performed at [facility], [address] [place], [place] [address]    Report Status 08/25/2018 FINAL  Final  Culture, respiratory     Status: None (Preliminary result)   Collection Time: 08/25/18  1:39 PM  Result Value Ref Range Status   Specimen Description   Final    SPUTUM Performed at [facility], [address], [place], [place] [address]    Special Requests   Final    Normal Reflexed from [phone] Performed at [facility], [address] [place], [place] [address]    Gram Stain   Final    FEW WBC PRESENT, PREDOMINANTLY PMN ABUNDANT GRAM POSITIVE COCCI MODERATE GRAM NEGATIVE RODS MODERATE GRAM POSITIVE RODS    Culture   Final    CULTURE REINCUBATED FOR BETTER GROWTH Performed at [facility], [address] [place], [place] [address]    Report Status PENDING  Incomplete  Body fluid culture     Status: None (Preliminary result)   Collection Time: 08/25/18  3:27 PM  Result Value Ref Range Status   Specimen Description   Final    PLEURAL Performed at [facility], [address], [place], [place] [address]    Special Requests   Final    Normal Performed at [facility], [address] [place], [place] [address]    Gram Stain   Final    RARE WBC PRESENT,BOTH PMN AND MONONUCLEAR NO ORGANISMS SEEN Performed at [facility], [address], [place], [place] [address]    Culture PENDING  Incomplete   Report Status PENDING  Incomplete    Medical History: Past Medical History:  Diagnosis Date  . Degenerative joint  disease   . ED (erectile dysfunction)   . Hyperlipidemia   . Hypertension   . Parkinson disease [facility])     Assessment: Patient is a 77yo male admitted for pneumonia. Patient was hypokalemic on admission and pharmacy was consulted for replacement.  Plan:  10/18 K is 3.2 this morning. Patient did get a dose of IV furosemide on 10/17. Will give KCl PO times one. Follow up on AM labs.  Clovia Cuff, PharmD, BCPS 08/27/2018 9:23 AM

## 2018-08-28 LAB — BASIC METABOLIC PANEL
Anion gap: 8 (ref 5–15)
BUN: 52 mg/dL — AB (ref 8–23)
CALCIUM: 8.1 mg/dL — AB (ref 8.9–10.3)
CHLORIDE: 100 mmol/L (ref 98–111)
CO2: 28 mmol/L (ref 22–32)
CREATININE: 0.93 mg/dL (ref 0.61–1.24)
GFR calc Af Amer: >60 mL/min (ref >60)
Glucose, Bld: 137 mg/dL — ABNORMAL HIGH (ref 70–99)
Potassium: 3.6 mmol/L (ref 3.5–5.1)
SODIUM: 136 mmol/L (ref 135–145)

## 2018-08-28 LAB — GLUCOSE, CAPILLARY: GLUCOSE-CAPILLARY: 113 mg/dL — AB (ref 70–99)

## 2018-08-28 LAB — CULTURE, RESPIRATORY W GRAM STAIN: Culture: NORMAL

## 2018-08-28 LAB — CULTURE, RESPIRATORY: SPECIAL REQUESTS: NORMAL

## 2018-08-28 MED ORDER — MORPHINE SULFATE (PF) 2 MG/ML IV SOLN: 2.0000 mg | INTRAVENOUS | Status: DC | PRN

## 2018-08-28 MED ORDER — SCOPOLAMINE 1 MG/3DAYS TD PT72
1.0000 | MEDICATED_PATCH | TRANSDERMAL | Status: DC
  Administered 2018-08-28 – 2018-08-31 (×2): 1.5 mg via TRANSDERMAL
  Filled 2018-08-28 (×2): qty 1

## 2018-08-28 MED ORDER — MORPHINE SULFATE (PF) 4 MG/ML IV SOLN: 4.0000 mg | INTRAVENOUS | Status: DC | PRN

## 2018-08-28 MED ORDER — HALOPERIDOL 0.5 MG PO TABS
0.5000 mg | ORAL_TABLET | ORAL | Status: DC | PRN
  Filled 2018-08-28: qty 1

## 2018-08-28 MED ORDER — HALOPERIDOL LACTATE 2 MG/ML PO CONC
0.5000 mg | ORAL | Status: DC | PRN
  Filled 2018-08-28: qty 0.3

## 2018-08-28 MED ORDER — ACETAMINOPHEN 650 MG RE SUPP: 650.0000 mg | Freq: Four times a day (QID) | RECTAL | Status: DC | PRN

## 2018-08-28 MED ORDER — LORAZEPAM 2 MG/ML IJ SOLN
1.0000 mg | INTRAMUSCULAR | Status: DC | PRN
  Administered 2018-08-28 – 2018-08-31 (×6): 1 mg via INTRAVENOUS
  Filled 2018-08-28 (×6): qty 1

## 2018-08-28 MED ORDER — LORAZEPAM 2 MG/ML PO CONC: 1.0000 mg | ORAL | Status: DC | PRN

## 2018-08-28 MED ORDER — HALOPERIDOL LACTATE 5 MG/ML IJ SOLN
0.5000 mg | INTRAMUSCULAR | Status: DC | PRN
  Filled 2018-08-28: qty 0.1

## 2018-08-28 MED ORDER — LORAZEPAM 1 MG PO TABS: 1.0000 mg | ORAL_TABLET | ORAL | Status: DC | PRN

## 2018-08-28 MED ORDER — MORPHINE SULFATE (CONCENTRATE) 10 MG/0.5ML PO SOLN
5.0000 mg | ORAL | Status: DC | PRN
  Administered 2018-08-30 – 2018-08-31 (×5): 5 mg via SUBLINGUAL
  Filled 2018-08-28 (×3): qty 1

## 2018-08-28 MED ORDER — ACETAMINOPHEN 325 MG PO TABS: 650.0000 mg | ORAL_TABLET | Freq: Four times a day (QID) | ORAL | Status: DC | PRN

## 2018-08-28 MED ORDER — MORPHINE SULFATE (CONCENTRATE) 10 MG/0.5ML PO SOLN
5.0000 mg | ORAL | Status: DC | PRN
  Filled 2018-08-28 (×2): qty 1

## 2018-08-28 NOTE — Progress Notes (Signed)
Advanced care plan.  Purpose of the Encounter: Goals of care   Parties in Attendance: patient and wife (Johnny Barnett)  Patient's Decision Capacity:not intact  Subjective/Patient's story: Patient 77 year old with Parkinson's disease, admitted with empyema and pneumonia.  Discussed with the wife regarding overall progression of his Parkinson's prior to his current illness.   Objective/Medical story I discussed with his wife who states that he has suffered significantly prior to being admitted.  And would not like him to be aggressively treated and would like him to be comfort care.   Goals of care determination: Comfort care    CODE STATUS: Comfort care with DNR   Time spent discussing advanced care planning: 16 minutes

## 2018-08-28 NOTE — Progress Notes (Signed)
Pharmacy Electrolyte Monitoring Consult:  Pharmacy consulted to assist in monitoring and replacing electrolytes in this 77 y.o. male admitted on 08/24/2018 with Weakness   Labs:  Sodium (mmol/L)  Date Value  08/28/2018 136  11/07/2014 135 (L)   Potassium (mmol/L)  Date Value  08/28/2018 3.6  11/07/2014 4.0   Magnesium (mg/dL)  Date Value  16/08/9603 2.1   Phosphorus (mg/dL)  Date Value  54/07/8118 2.7   Calcium (mg/dL)  Date Value  14/78/2956 8.1 (L)   Calcium, Total (mg/dL)  Date Value  21/30/8657 8.5   Albumin (g/dL)  Date Value  84/69/6295 3.9  08/07/2012 3.2 (L)    Assessment/Plan:    Electrolytes WNL. No supplementation is warranted at this time.  Will recheck electrolytes with am labs.   Stormy Card, [facility] 08/28/2018 8:47 AM

## 2018-08-28 NOTE — Progress Notes (Signed)
SURGICAL PROGRESS NOTE   Hospital Day(s): 3.   Post op day(s):  Marland Kitchen   Interval History: Patient seen and examined, no acute events or new complaints overnight. Patient very anxious about Parkinson medications. Family at bedside referring this medication is old.   Vital signs in last 24 hours: [min-max] current  Temp:  [97.9 F (36.6 C)-99.2 F (37.3 C)] 98 F (36.7 C) (10/19 0449) Pulse Rate:  [76-98] 76 (10/19 0449) Resp:  [16-22] 20 (10/19 0449) BP: (86-150)/(65-110) 98/66 (10/19 0449) SpO2:  [91 %-98 %] 97 % (10/19 0449) Weight:  [64.1 kg] 64.1 kg (10/19 0448)     Height: 5\' 7"  (170.2 cm) Weight: 64.1 kg BMI (Calculated): 22.13   Physical Exam:  Constitutional: alert, cooperative and no distress  Respiratory: breathing non-labored at rest  Cardiovascular: regular rate and sinus rhythm  Chest: drain tube in place with fluctuation.   Labs:  CBC Latest Ref Rng & Units 08/26/2018 08/25/2018 08/24/2018  WBC 4.0 - 10.5 K/uL 15.0(H) 10.3 11.2(H)  Hemoglobin 13.0 - 17.0 g/dL 10.4(L) 9.7(L) 10.6(L)  Hematocrit 39.0 - 52.0 % 31.8(L) 29.5(L) 31.7(L)  Platelets 150 - 400 K/uL 309 289 326   CMP Latest Ref Rng & Units 08/28/2018 08/27/2018 08/26/2018  Glucose 70 - 99 mg/dL 960(A) 540(J) 811(B)  BUN 8 - 23 mg/dL 14(N) 82(N) 56(O)  Creatinine 0.61 - 1.24 mg/dL 1.30 8.65 7.84  Sodium 135 - 145 mmol/L 136 134(L) 132(L)  Potassium 3.5 - 5.1 mmol/L 3.6 3.2(L) 4.2  Chloride 98 - 111 mmol/L 100 97(L) 97(L)  CO2 22 - 32 mmol/L 28 28 27   Calcium 8.9 - 10.3 mg/dL 8.1(L) 8.1(L) 7.9(L)  Total Protein 6.0 - 8.3 g/dL - - -  Total Bilirubin 0.2 - 1.2 mg/dL - - -  Alkaline Phos 39 - 117 U/L - - -  AST 0 - 37 U/L - - -  ALT 0 - 53 U/L - - -    Imaging studies: No new pertinent imaging studies   Assessment/Plan:  77 y.o. male with right sided pneumonia with empyema status post chest tube placement and TPA therapy. As assessed by Dr. Parks Ranger, patient poor candidate for surgery. Will continue with  IV abx and medical management. TPA trial given and increase amount of drainage today. Still very delicate clinically.    Gae Gallop, MD

## 2018-08-28 NOTE — Progress Notes (Signed)
Patient ID: Johnny Barnett, male   DOB: 01/10/1941, 77 y.o.   MRN: [id number]  [employer] PROGRESS NOTE  Johnny Barnett WUJ:[id number] DOB: Sep 15, 1941 DOA: 08/24/2018 PCP: Karie Schwalbe, MD  HPI/Subjective:  Patient very restless overnight agitated  Objective: Vitals:   08/28/18 0243 08/28/18 0449  BP:  98/66  Pulse:  76  Resp:  20  Temp:  98 F (36.7 C)  SpO2: 96% 97%    Filed Weights   08/26/18 0336 08/27/18 0522 08/28/18 0448  Weight: 66.8 kg 65.3 kg 64.1 kg    ROS: Review of Systems  Constitutional: Negative for chills and fever.  Eyes: Negative for blurred vision.  Respiratory: Positive for cough and shortness of breath.   Cardiovascular: Negative for chest pain.  Gastrointestinal: Negative for abdominal pain, constipation, diarrhea, nausea and vomiting.  Genitourinary: Negative for dysuria.  Musculoskeletal: Negative for joint pain.  Neurological: Negative for dizziness and headaches.   Exam: Physical Exam  HENT:  Nose: No mucosal edema.  Mouth/Throat: No oropharyngeal exudate or posterior oropharyngeal edema.  Eyes: Pupils are equal, round, and reactive to light. Conjunctivae, EOM and lids are normal.  Neck: No JVD present. Carotid bruit is not present. No edema present. No thyroid mass and no thyromegaly present.  Cardiovascular: S1 normal and S2 normal. Exam reveals no gallop.  No murmur heard. Pulses:      Dorsalis pedis pulses are 2+ on the right side, and 2+ on the left side.  Respiratory: No respiratory distress. He has decreased breath sounds in the right middle field, the right lower field and the left lower field. He has no wheezes. He has no rhonchi. He has no rales.  GI: Soft. Bowel sounds are normal. There is no tenderness.  Musculoskeletal:       Right ankle: He exhibits no swelling.       Left ankle: He exhibits no swelling.  Lymphadenopathy:    He has no cervical adenopathy.  Neurological: No cranial nerve deficit.  Agitated   Skin: Skin is warm. No rash noted. Nails show no clubbing.  Psychiatric: He has a normal mood and affect.      Data Reviewed: Basic Metabolic Panel: Recent Labs  Lab 08/24/18 1911 08/25/18 0438 08/26/18 0641 08/27/18 0542 08/28/18 0609  NA 131* 133* 132* 134* 136  K 3.3* 2.9* 4.2 3.2* 3.6  CL 96* 96* 97* 97* 100  CO2 27 28 27 28 28   GLUCOSE 117* 99 103* 146* 137*  BUN 35* 30* 33* 37* 52*  CREATININE 0.80 0.76 0.70 0.87 0.93  CALCIUM 8.1* 7.7* 7.9* 8.1* 8.1*  MG  --  2.0 2.1  --   --   PHOS  --   --  2.7  --   --    CBC: Recent Labs  Lab 08/24/18 1911 08/25/18 0438 08/26/18 0641  WBC 11.2* 10.3 15.0*  HGB 10.6* 9.7* 10.4*  HCT 31.7* 29.5* 31.8*  MCV 97.2 98.3 99.4  PLT 326 289 309   Cardiac Enzymes: Recent Labs  Lab 08/24/18 1911  TROPONINI 0.03*    Recent Results (from the past 240 hour(s))  Blood culture (routine x 2)     Status: None (Preliminary result)   Collection Time: 08/24/18 11:19 PM  Result Value Ref Range Status   Specimen Description BLOOD LEFT FATTY CASTS  Final   Special Requests   Final    BOTTLES DRAWN AEROBIC AND ANAEROBIC Blood Culture results may not be optimal due to an excessive volume  of blood received in culture bottles   Culture   Final    NO GROWTH 4 DAYS Performed at [facility], [address] [place], [place] [address]    Report Status PENDING  Incomplete  Blood culture (routine x 2)     Status: None (Preliminary result)   Collection Time: 08/24/18 11:19 PM  Result Value Ref Range Status   Specimen Description BLOOD RIGHT FATTY CASTS  Final   Special Requests   Final    Blood Culture results may not be optimal due to an excessive volume of blood received in culture bottles   Culture   Final    NO GROWTH 4 DAYS Performed at [facility], [address] [place], [place] [address]    Report Status PENDING  Incomplete  Expectorated sputum assessment w rflx to resp cult     Status: None   Collection  Time: 08/25/18  1:39 PM  Result Value Ref Range Status   Specimen Description SPUTUM  Final   Special Requests Normal  Final   Sputum evaluation   Final    THIS SPECIMEN IS ACCEPTABLE FOR SPUTUM CULTURE Performed at [facility], [address], [place], [place] [address]    Report Status 08/25/2018 FINAL  Final  Culture, respiratory     Status: None   Collection Time: 08/25/18  1:39 PM  Result Value Ref Range Status   Specimen Description   Final    SPUTUM Performed at [facility], [address] [place], [place] [address]    Special Requests   Final    Normal Reflexed from [id number] Performed at [facility], [address] [place], [place] [address]    Gram Stain   Final    FEW WBC PRESENT, PREDOMINANTLY PMN ABUNDANT GRAM POSITIVE COCCI MODERATE GRAM NEGATIVE RODS MODERATE GRAM POSITIVE RODS    Culture   Final    Consistent with normal respiratory flora. Performed at [facility], [address], [place], [place] [address]    Report Status 08/28/2018 FINAL  Final  Body fluid culture     Status: None (Preliminary result)   Collection Time: 08/25/18  3:27 PM  Result Value Ref Range Status   Specimen Description   Final    PLEURAL Performed at [facility], [address] [place], [place] [address]    Special Requests   Final    Normal Performed at [facility], [address] [place], [place] [address]    Gram Stain   Final    RARE WBC PRESENT,BOTH PMN AND MONONUCLEAR NO ORGANISMS SEEN    Culture   Final    NO GROWTH 2 DAYS Performed at [facility], [address], [place], [place] [address]    Report Status PENDING  Incomplete  Culture, blood (Routine X 2) w Reflex to ID Panel     Status: None (Preliminary result)   Collection Time: 08/27/18 11:15 PM  Result Value Ref Range Status   Specimen Description BLOOD LEFT ANTECUBITAL  Final   Special Requests   Final    BOTTLES DRAWN AEROBIC AND  ANAEROBIC Blood Culture results may not be optimal due to an excessive volume of blood received in culture bottles   Culture   Final    NO GROWTH < 12 HOURS Performed at [facility], [address], [place], [place] [address]    Report Status PENDING  Incomplete  Culture, blood (Routine X 2)  w Reflex to ID Panel     Status: None (Preliminary result)   Collection Time: 08/27/18 11:15 PM  Result Value Ref Range Status   Specimen Description BLOOD LEFT HAND  Final   Special Requests   Final    BOTTLES DRAWN AEROBIC AND ANAEROBIC Blood Culture results may not be optimal due to an excessive volume of blood received in culture bottles   Culture   Final    NO GROWTH < 12 HOURS Performed at [facility], [address], [place], [place] [address]    Report Status PENDING  Incomplete     Studies: Dg Chest Port 1 View  Result Date: 08/27/2018 CLINICAL DATA:  77 y/o M; cough, right-sided chest tube placement. History of hypertension and Parkinson's disease. EXAM: PORTABLE CHEST 1 VIEW COMPARISON:  08/27/2018 chest radiograph FINDINGS: Stable cardiac silhouette within normal limits given projection and technique. Calcific aortic atherosclerosis. Stable moderate right-sided pleural effusion with pleural drain in situ. No pneumothorax. Increased pulmonary markings is expiratory radiograph. Stable ill-defined basilar opacities. Multilevel degenerative changes of the spine. Bones no acute osseous abnormality is evident. IMPRESSION: Stable moderate right-sided pleural effusion with pleural drain in situ. Stable ill-defined basilar opacities which may represent associated atelectasis or pneumonia. Electronically Signed   By: Mitzi Hansen M.D.   On: 08/27/2018 22:44   Dg Chest Port 1 View  Result Date: 08/27/2018 CLINICAL DATA:  Follow-up right chest tube and pleural effusion EXAM: PORTABLE CHEST 1 VIEW COMPARISON:  08/25/2018 FINDINGS: Pigtail chest tube has been placed. No  pneumothorax is identified. Persistent lateral density is noted along the right chest wall which may represent some loculated fluid. Small inferior pleural effusion is noted on the right as well. The right effusion has improved following chest tube placement. The left lung remains clear. IMPRESSION: Chest tube in place without pneumothorax. Stable densities laterally and inferiorly consistent with fluid are seen but improved from the prior exam. Electronically Signed   By: Alcide Clever M.D.   On: 08/27/2018 08:34   Ct Image Guided Drainage By Percutaneous Catheter  Result Date: 08/26/2018 INDICATION: 77 year old male with right-sided empyema. EXAM: CT-guided chest tube placement MEDICATIONS: The patient is currently admitted to the hospital and receiving intravenous antibiotics. The antibiotics were administered within an appropriate time frame prior to the initiation of the procedure. ANESTHESIA/SEDATION: None COMPLICATIONS: None immediate. PROCEDURE: Informed written consent was obtained from the patient after a thorough discussion of the procedural risks, benefits and alternatives. All questions were addressed. A timeout was performed prior to the initiation of the procedure. A planning axial CT scan was performed. There is a large posterior fluid collection despite thoracentesis yesterday. A suitable skin entry site was selected and marked. The overlying skin was sterilely prepped and draped in the standard fashion using chlorhexidine skin prep. Local anesthesia was attained by infiltration with 1% lidocaine. A small dermatotomy was made. An 18 gauge trocar needle was carefully advanced into the pleural space and directed superiorly. An Amplatz wire was then advanced to the lung apex. The skin tract was dilated to 63 [place] and a Cook 12 [place] all-purpose drainage catheter modified with additional sideholes was advanced over the wire and formed. The catheter was connected to wall suction via an atrium  yielding approximately 700 mL of straw-colored pleural fluid. Follow-up CT imaging demonstrates significant improvement in the volume of the pleural effusion and a well-positioned drainage catheter. The catheter was secured to the skin with 0 Prolene suture and a sterile bandage  was placed. IMPRESSION: Successful placement of a 68 French right-sided pigtail thoracostomy tube modified with additional sideholes. Electronically Signed   By: [name] M.D.   On: 08/26/2018 16:59    Scheduled Meds: . scopolamine  1 patch Transdermal Q72H   Continuous Infusions: . sodium chloride 10 mL/hr at 08/28/18 0835    Assessment/Plan:  1. Acute hypoxic respiratory failure.   due to empyema.  Discussed with patient wife she states that she would not want patient to be treated aggressively 2. Empyema, pneumonia with large pleural effusion continue comfort measures 3.  hypokalemia replaced 4. Parkinson's stop all meds 5. Acute encephalopathy due to #1  Gust with wife patient will be comfort care now  Code Status:     Code Status Orders  (From admission, onward)         Start     Ordered   08/25/18 1335  Do not attempt resuscitation (DNR)  Continuous    Question Answer Comment  In the event of cardiac or respiratory ARREST Do not call a "code blue"   In the event of cardiac or respiratory ARREST Do not perform Intubation, CPR, defibrillation or ACLS   In the event of cardiac or respiratory ARREST Use medication by any route, position, wound care, and other measures to relive pain and suffering. May use oxygen, suction and manual treatment of airway obstruction as needed for comfort.   Comments nurse may pronounce      08/25/18 1334        Code Status History    Date Active Date Inactive Code Status Order ID Comments User Context   08/25/2018 0200 08/25/2018 1334 Full Code [id number]  Cammy Copa, MD Inpatient    Advance Directive Documentation     Most Recent Value  Type of Advance  Directive  Healthcare Power of Attorney  Pre-existing out of facility DNR order (yellow form or pink MOST form)  -  "MOST" Form in Place?  -     Family Communication: Discussed with wife Disposition Plan: To be determined  Consultants:  CTS  Antibiotics:  Rocephin  Zithromax  Flagyl  Time spent: 25 minutes  Aariz [employer]

## 2018-08-29 LAB — BODY FLUID CULTURE
Culture: NO GROWTH
Special Requests: NORMAL

## 2018-08-29 LAB — CULTURE, BLOOD (ROUTINE X 2)
CULTURE: NO GROWTH
Culture: NO GROWTH

## 2018-08-29 NOTE — Progress Notes (Signed)
Pt had change in status, chills then sweats, AMS and agitation. Pt family noted that Pt was different from earlier in the day.  MD made aware and orders placed for chest xray, blood tests and cultures. RT assessment and ABG's were requested. ABT were changed and new ABT added along with a NS bolus. Pt had one IV in RFA placed earlier by IV team. Consult for another IV was placed in order that ABT"s and bolus could be infused simultaneously. Nurse received call from IV team nurse Ivette Loyal noting that he did not think that having several ABTs to run warrants a second IV and that the nurse should run them one after the other. IV nurse was updated on Pt's change in status and MD's orders noting that MD was concerned that the Pt may be septic.and that the sepsis protocol calls for two IV's.  IV nurse stated that he was not going to put a second IV just because new orders were placed and that the MD himself would have to place the order for a second IV. MD was notified and order placed for second IV but order was subsequently DC'ed by IV team nurse. IV was then successfully placed be staff nurse.

## 2018-08-29 NOTE — Progress Notes (Signed)
Patient ID: Johnny Barnett, male   DOB: 1941-09-03, 77 y.o.   MRN: [id number]  [employer] PROGRESS NOTE  Johnny Barnett WUJ:[id number] DOB: Jun 15, 1941 DOA: 08/24/2018 PCP: Tillman Abide I, MD  HPI/Subjective:  Patient doing better more comfortable pain under control  Objective: Vitals:   08/28/18 0243 08/28/18 0449  BP:  98/66  Pulse:  76  Resp:  20  Temp:  98 F (36.7 C)  SpO2: 96% 97%    Filed Weights   08/26/18 0336 08/27/18 0522 08/28/18 0448  Weight: 66.8 kg 65.3 kg 64.1 kg    ROS: Review of Systems  Constitutional: Negative for chills and fever.  Eyes: Negative for blurred vision.  Respiratory: Positive for cough and shortness of breath.   Cardiovascular: Negative for chest pain.  Gastrointestinal: Negative for abdominal pain, constipation, diarrhea, nausea and vomiting.  Genitourinary: Negative for dysuria.  Musculoskeletal: Negative for joint pain.  Neurological: Negative for dizziness and headaches.   Exam: Physical Exam  HENT:  Nose: No mucosal edema.  Mouth/Throat: No oropharyngeal exudate or posterior oropharyngeal edema.  Eyes: Pupils are equal, round, and reactive to light. Conjunctivae, EOM and lids are normal.  Neck: No JVD present. Carotid bruit is not present. No edema present. No thyroid mass and no thyromegaly present.  Cardiovascular: S1 normal and S2 normal. Exam reveals no gallop.  No murmur heard. Pulses:      Dorsalis pedis pulses are 2+ on the right side, and 2+ on the left side.  Respiratory: No respiratory distress. He has decreased breath sounds in the right middle field, the right lower field and the left lower field. He has no wheezes. He has no rhonchi. He has no rales.  GI: Soft. Bowel sounds are normal. There is no tenderness.  Musculoskeletal:       Right ankle: He exhibits no swelling.       Left ankle: He exhibits no swelling.  Lymphadenopathy:    He has no cervical adenopathy.  Neurological: No cranial nerve deficit.   Agitated  Skin: Skin is warm. No rash noted. Nails show no clubbing.  Psychiatric: He has a normal mood and affect.      Data Reviewed: Basic Metabolic Panel: Recent Labs  Lab 08/24/18 1911 08/25/18 0438 08/26/18 0641 08/27/18 0542 08/28/18 0609  NA 131* 133* 132* 134* 136  K 3.3* 2.9* 4.2 3.2* 3.6  CL 96* 96* 97* 97* 100  CO2 27 28 27 28 28   GLUCOSE 117* 99 103* 146* 137*  BUN 35* 30* 33* 37* 52*  CREATININE 0.80 0.76 0.70 0.87 0.93  CALCIUM 8.1* 7.7* 7.9* 8.1* 8.1*  MG  --  2.0 2.1  --   --   PHOS  --   --  2.7  --   --    CBC: Recent Labs  Lab 08/24/18 1911 08/25/18 0438 08/26/18 0641  WBC 11.2* 10.3 15.0*  HGB 10.6* 9.7* 10.4*  HCT 31.7* 29.5* 31.8*  MCV 97.2 98.3 99.4  PLT 326 289 309   Cardiac Enzymes: Recent Labs  Lab 08/24/18 1911  TROPONINI 0.03*    Recent Results (from the past 240 hour(s))  Blood culture (routine x 2)     Status: None   Collection Time: 08/24/18 11:19 PM  Result Value Ref Range Status   Specimen Description BLOOD LEFT FATTY CASTS  Final   Special Requests   Final    BOTTLES DRAWN AEROBIC AND ANAEROBIC Blood Culture results may not be optimal due to an excessive  volume of blood received in culture bottles   Culture   Final    NO GROWTH 5 DAYS Performed at [facility], [address] [place], [place] [address]    Report Status 08/29/2018 FINAL  Final  Blood culture (routine x 2)     Status: None   Collection Time: 08/24/18 11:19 PM  Result Value Ref Range Status   Specimen Description BLOOD RIGHT FATTY CASTS  Final   Special Requests   Final    Blood Culture results may not be optimal due to an excessive volume of blood received in culture bottles   Culture   Final    NO GROWTH 5 DAYS Performed at [facility], [address] [place], [place] [address]    Report Status 08/29/2018 FINAL  Final  Expectorated sputum assessment w rflx to resp cult     Status: None   Collection Time: 08/25/18  1:39 PM   Result Value Ref Range Status   Specimen Description SPUTUM  Final   Special Requests Normal  Final   Sputum evaluation   Final    THIS SPECIMEN IS ACCEPTABLE FOR SPUTUM CULTURE Performed at [facility], [address], [place], [place] [address]    Report Status 08/25/2018 FINAL  Final  Culture, respiratory     Status: None   Collection Time: 08/25/18  1:39 PM  Result Value Ref Range Status   Specimen Description   Final    SPUTUM Performed at [facility], [address], [place], [place] [address]    Special Requests   Final    Normal Reflexed from [id number] Performed at [facility], [address] [place], [place] [address]    Gram Stain   Final    FEW WBC PRESENT, PREDOMINANTLY PMN ABUNDANT GRAM POSITIVE COCCI MODERATE GRAM NEGATIVE RODS MODERATE GRAM POSITIVE RODS    Culture   Final    Consistent with normal respiratory flora. Performed at [facility], [address] [place], [place] [address]    Report Status 08/28/2018 FINAL  Final  Body fluid culture     Status: None   Collection Time: 08/25/18  3:27 PM  Result Value Ref Range Status   Specimen Description   Final    PLEURAL Performed at [facility], [address] [place], [place] [address]    Special Requests   Final    Normal Performed at [facility], [address] [place], [place] [address]    Gram Stain   Final    RARE WBC PRESENT,BOTH PMN AND MONONUCLEAR NO ORGANISMS SEEN    Culture   Final    NO GROWTH 3 DAYS Performed at [facility], [address], [place], [place] [address]    Report Status 08/29/2018 FINAL  Final  Culture, blood (Routine X 2) w Reflex to ID Panel     Status: None (Preliminary result)   Collection Time: 08/27/18 11:15 PM  Result Value Ref Range Status   Specimen Description BLOOD LEFT ANTECUBITAL  Final   Special Requests   Final    BOTTLES DRAWN AEROBIC AND ANAEROBIC Blood Culture results may not be  optimal due to an excessive volume of blood received in culture bottles   Culture   Final    NO GROWTH 2 DAYS Performed at [facility], [address] [place], [place] [address]    Report Status PENDING  Incomplete  Culture, blood (Routine X 2) w  Reflex to ID Panel     Status: None (Preliminary result)   Collection Time: 08/27/18 11:15 PM  Result Value Ref Range Status   Specimen Description BLOOD LEFT HAND  Final   Special Requests   Final    BOTTLES DRAWN AEROBIC AND ANAEROBIC Blood Culture results may not be optimal due to an excessive volume of blood received in culture bottles   Culture   Final    NO GROWTH 2 DAYS Performed at [facility], [address], [place], [place] [address]    Report Status PENDING  Incomplete     Studies: Dg Chest Port 1 View  Result Date: 08/27/2018 CLINICAL DATA:  77 y/o M; cough, right-sided chest tube placement. History of hypertension and Parkinson's disease. EXAM: PORTABLE CHEST 1 VIEW COMPARISON:  08/27/2018 chest radiograph FINDINGS: Stable cardiac silhouette within normal limits given projection and technique. Calcific aortic atherosclerosis. Stable moderate right-sided pleural effusion with pleural drain in situ. No pneumothorax. Increased pulmonary markings is expiratory radiograph. Stable ill-defined basilar opacities. Multilevel degenerative changes of the spine. Bones no acute osseous abnormality is evident. IMPRESSION: Stable moderate right-sided pleural effusion with pleural drain in situ. Stable ill-defined basilar opacities which may represent associated atelectasis or pneumonia. Electronically Signed   By: Mitzi Hansen M.D.   On: 08/27/2018 22:44    Scheduled Meds: . scopolamine  1 patch Transdermal Q72H   Continuous Infusions: . sodium chloride 10 mL/hr at 08/29/18 0444    Assessment/Plan:  1. Acute hypoxic respiratory failure.   due to empyema.  Comfort care only 2. Empyema, pneumonia with large  pleural effusion continue comfort measures chest tube removal per surgery 3.  hypokalemia replaced 4. Parkinson's stop all meds 5. Acute encephalopathy due to #1  Gust with wife patient will be comfort care now  Code Status:     Code Status Orders  (From admission, onward)         Start     Ordered   08/25/18 1335  Do not attempt resuscitation (DNR)  Continuous    Question Answer Comment  In the event of cardiac or respiratory ARREST Do not call a "code blue"   In the event of cardiac or respiratory ARREST Do not perform Intubation, CPR, defibrillation or ACLS   In the event of cardiac or respiratory ARREST Use medication by any route, position, wound care, and other measures to relive pain and suffering. May use oxygen, suction and manual treatment of airway obstruction as needed for comfort.   Comments nurse may pronounce      08/25/18 1334        Code Status History    Date Active Date Inactive Code Status Order ID Comments User Context   08/25/2018 0200 08/25/2018 1334 Full Code [id number]  Cammy Copa, MD Inpatient    Advance Directive Documentation     Most Recent Value  Type of Advance Directive  Healthcare Power of Attorney  Pre-existing out of facility DNR order (yellow form or pink MOST form)  -  "MOST" Form in Place?  -     Family Communication: Discussed with wife Disposition Plan: To be determined  Consultants:  CTS  Antibiotics:  Rocephin  Zithromax  Flagyl  Time spent: 25 minutes  Deondra Labrador [employer]

## 2018-08-29 NOTE — Progress Notes (Signed)
SURGICAL PROGRESS NOTE   Hospital Day(s): 4.   Post op day(s):  Marland Kitchen   Interval History: Patient seen and examined, no acute events or new complaints overnight. Patient reports feeling better. Refers was able to rest good at night.  Vital signs in last 24 hours: [min-max] current        Height: 5\' 7"  (170.2 cm) Weight: 64.1 kg BMI (Calculated): 22.13   Chest tube output: 1250 mL  Physical Exam:  Constitutional: alert, cooperative and no distress  Respiratory: breathing non-labored at rest  Cardiovascular: regular rate and sinus rhythm    Labs:  CBC Latest Ref Rng & Units 08/26/2018 08/25/2018 08/24/2018  WBC 4.0 - 10.5 K/uL 15.0(H) 10.3 11.2(H)  Hemoglobin 13.0 - 17.0 g/dL 10.4(L) 9.7(L) 10.6(L)  Hematocrit 39.0 - 52.0 % 31.8(L) 29.5(L) 31.7(L)  Platelets 150 - 400 K/uL 309 289 326   CMP Latest Ref Rng & Units 08/28/2018 08/27/2018 08/26/2018  Glucose 70 - 99 mg/dL 161(W) 960(A) 540(J)  BUN 8 - 23 mg/dL 81(X) 91(Y) 78(G)  Creatinine 0.61 - 1.24 mg/dL 9.56 2.13 0.86  Sodium 135 - 145 mmol/L 136 134(L) 132(L)  Potassium 3.5 - 5.1 mmol/L 3.6 3.2(L) 4.2  Chloride 98 - 111 mmol/L 100 97(L) 97(L)  CO2 22 - 32 mmol/L 28 28 27   Calcium 8.9 - 10.3 mg/dL 8.1(L) 8.1(L) 7.9(L)  Total Protein 6.0 - 8.3 g/dL - - -  Total Bilirubin 0.2 - 1.2 mg/dL - - -  Alkaline Phos 39 - 117 U/L - - -  AST 0 - 37 U/L - - -  ALT 0 - 53 U/L - - -    Imaging studies: No new pertinent imaging studies   Assessment/Plan:  77 y.o. male with right sided pneumonia with empyema status post chest tube placement and TPA therapy. Feeling better today and less anxious because was able to rest better yesterday. High output per chest tube. Will repeat chest xray tomorrow. No leak appreciated. Patient with poor breathing effort.  Will keep chest tube in place at suction.   Gae Gallop, MD

## 2018-08-30 NOTE — Progress Notes (Signed)
Patient ID: Johnny Barnett, male   DOB: 1940-12-22, 77 y.o.   MRN: [id number]  [employer] PROGRESS NOTE  Johnny Barnett XBJ:[id number] DOB: [birth year] DOA: 08/24/2018 PCP: Tillman Abide I, MD  HPI/Subjective:  Patient comfortable chest tube removed earlier  Objective: Vitals:   08/28/18 0243 08/28/18 0449  BP:  98/66  Pulse:  76  Resp:  20  Temp:  98 F (36.7 C)  SpO2: 96% 97%    Filed Weights   08/26/18 0336 08/27/18 0522 08/28/18 0448  Weight: 66.8 kg 65.3 kg 64.1 kg    ROS: ROS Exam: Physical Exam  HENT:  Nose: No mucosal edema.  Mouth/Throat: No oropharyngeal exudate or posterior oropharyngeal edema.  Eyes: Pupils are equal, round, and reactive to light. Conjunctivae, EOM and lids are normal.  Neck: No JVD present. Carotid bruit is not present. No edema present. No thyroid mass and no thyromegaly present.  Cardiovascular: S1 normal and S2 normal. Exam reveals no gallop.  No murmur heard. Pulses:      Dorsalis pedis pulses are 2+ on the right side, and 2+ on the left side.  Respiratory: No respiratory distress. He has decreased breath sounds in the right middle field, the right lower field and the left lower field. He has no wheezes. He has no rhonchi. He has no rales.  GI: Soft. Bowel sounds are normal. There is no tenderness.  Musculoskeletal:       Right ankle: He exhibits no swelling.       Left ankle: He exhibits no swelling.  Lymphadenopathy:    He has no cervical adenopathy.  Neurological: No cranial nerve deficit.  Currently resting  Skin: Skin is warm. No rash noted. Nails show no clubbing.  Psychiatric: He has a normal mood and affect.      Data Reviewed: Basic Metabolic Panel: Recent Labs  Lab 08/24/18 1911 08/25/18 0438 08/26/18 0641 08/27/18 0542 08/28/18 0609  NA 131* 133* 132* 134* 136  K 3.3* 2.9* 4.2 3.2* 3.6  CL 96* 96* 97* 97* 100  CO2 27 28 27 28 28   GLUCOSE 117* 99 103* 146* 137*  BUN 35* 30* 33* 37* 52*  CREATININE  0.80 0.76 0.70 0.87 0.93  CALCIUM 8.1* 7.7* 7.9* 8.1* 8.1*  MG  --  2.0 2.1  --   --   PHOS  --   --  2.7  --   --    CBC: Recent Labs  Lab 08/24/18 1911 08/25/18 0438 08/26/18 0641  WBC 11.2* 10.3 15.0*  HGB 10.6* 9.7* 10.4*  HCT 31.7* 29.5* 31.8*  MCV 97.2 98.3 99.4  PLT 326 289 309   Cardiac Enzymes: Recent Labs  Lab 08/24/18 1911  TROPONINI 0.03*    Recent Results (from the past 240 hour(s))  Blood culture (routine x 2)     Status: None   Collection Time: 08/24/18 11:19 PM  Result Value Ref Range Status   Specimen Description BLOOD LEFT FATTY CASTS  Final   Special Requests   Final    BOTTLES DRAWN AEROBIC AND ANAEROBIC Blood Culture results may not be optimal due to an excessive volume of blood received in culture bottles   Culture   Final    NO GROWTH 5 DAYS Performed at [facility], [address], [place], [place] [address]    Report Status 08/29/2018 FINAL  Final  Blood culture (routine x 2)     Status: None   Collection Time: 08/24/18 11:19 PM  Result Value  Ref Range Status   Specimen Description BLOOD RIGHT FATTY CASTS  Final   Special Requests   Final    Blood Culture results may not be optimal due to an excessive volume of blood received in culture bottles   Culture   Final    NO GROWTH 5 DAYS Performed at [facility], [address] [place], [place] [address]    Report Status 08/29/2018 FINAL  Final  Expectorated sputum assessment w rflx to resp cult     Status: None   Collection Time: 08/25/18  1:39 PM  Result Value Ref Range Status   Specimen Description SPUTUM  Final   Special Requests Normal  Final   Sputum evaluation   Final    THIS SPECIMEN IS ACCEPTABLE FOR SPUTUM CULTURE Performed at [facility], [address], [place], [place] [address]    Report Status 08/25/2018 FINAL  Final  Culture, respiratory     Status: None   Collection Time: 08/25/18  1:39 PM  Result Value Ref Range Status   Specimen  Description   Final    SPUTUM Performed at [facility], [address], [place], [place] [address]    Special Requests   Final    Normal Reflexed from [phone] Performed at [facility], [address] [place], [place] [address]    Gram Stain   Final    FEW WBC PRESENT, PREDOMINANTLY PMN ABUNDANT GRAM POSITIVE COCCI MODERATE GRAM NEGATIVE RODS MODERATE GRAM POSITIVE RODS    Culture   Final    Consistent with normal respiratory flora. Performed at [facility], [address], [place], [place] [address]    Report Status 08/28/2018 FINAL  Final  Body fluid culture     Status: None   Collection Time: 08/25/18  3:27 PM  Result Value Ref Range Status   Specimen Description   Final    PLEURAL Performed at [facility], [address], [place], [place] [address]    Special Requests   Final    Normal Performed at [facility], [address] [place], [place] [address]    Gram Stain   Final    RARE WBC PRESENT,BOTH PMN AND MONONUCLEAR NO ORGANISMS SEEN    Culture   Final    NO GROWTH 3 DAYS Performed at [facility], [address], [place], [place] [address]    Report Status 08/29/2018 FINAL  Final  Culture, blood (Routine X 2) w Reflex to ID Panel     Status: None (Preliminary result)   Collection Time: 08/27/18 11:15 PM  Result Value Ref Range Status   Specimen Description BLOOD LEFT ANTECUBITAL  Final   Special Requests   Final    BOTTLES DRAWN AEROBIC AND ANAEROBIC Blood Culture results may not be optimal due to an excessive volume of blood received in culture bottles   Culture   Final    NO GROWTH 3 DAYS Performed at [facility], [address], [place], [place] [address]    Report Status PENDING  Incomplete  Culture, blood (Routine X 2) w Reflex to ID Panel     Status: None (Preliminary result)   Collection Time: 08/27/18 11:15 PM  Result Value Ref Range Status   Specimen Description BLOOD LEFT HAND   Final   Special Requests   Final    BOTTLES DRAWN AEROBIC AND ANAEROBIC Blood Culture results may not be optimal due to an excessive volume of blood  received in culture bottles   Culture   Final    NO GROWTH 3 DAYS Performed at [facility], [address] [place], [place] [address]    Report Status PENDING  Incomplete     Studies: No results found.  Scheduled Meds: . scopolamine  1 patch Transdermal Q72H   Continuous Infusions: . sodium chloride 10 mL/hr at 08/30/18 0429    Assessment/Plan:  1. Acute hypoxic respiratory failure.   due to empyema.  Comfort care only 2. Empyema, pneumonia with large pleural effusion continue comfort measures chest tube removal per surgery 3.  hypokalemia replaced 4. Parkinson's stop all meds 5. Acute encephalopathy due to #1  Gust with wife patient will be comfort care now  Code Status:     Code Status Orders  (From admission, onward)         Start     Ordered   08/25/18 1335  Do not attempt resuscitation (DNR)  Continuous    Question Answer Comment  In the event of cardiac or respiratory ARREST Do not call a "code blue"   In the event of cardiac or respiratory ARREST Do not perform Intubation, CPR, defibrillation or ACLS   In the event of cardiac or respiratory ARREST Use medication by any route, position, wound care, and other measures to relive pain and suffering. May use oxygen, suction and manual treatment of airway obstruction as needed for comfort.   Comments nurse may pronounce      08/25/18 1334        Code Status History    Date Active Date Inactive Code Status Order ID Comments User Context   08/25/2018 0200 08/25/2018 1334 Full Code [id number]  Cammy Copa, MD Inpatient    Advance Directive Documentation     Most Recent Value  Type of Advance Directive  Healthcare Power of Attorney  Pre-existing out of facility DNR order (yellow form or pink MOST form)  -  "MOST" Form in Place?  -     Family  Communication: Discussed with wife Disposition Plan: To be determined  Consultants:  CTS  Antibiotics:  Rocephin  Zithromax  Flagyl  Time spent: 25 minutes  Emiliya Chretien [employer]

## 2018-08-30 NOTE — Care Management (Signed)
Chest tube was removed today and patient currently on nasal cannula. patient is now comfort care. Has been made comfort care. All of his parkinson meds have been stopped. Spoke with Mrs.Tweten and she wishes for patient to discharge to the hospice home. CM discussed the need for patient to meet hospice home criteria.  Hospice agency preference is [facility].  Spoke with Servando Snare.  She review patient history today and will meet with Mrs Isola in the morning. Updated Mrs Grosvenor and primary nurse.

## 2018-08-30 NOTE — Progress Notes (Signed)
Chest tube remove by PA.

## 2018-08-30 NOTE — Care Management Important Message (Signed)
Copy of signed IM left with patient in room.  

## 2018-08-30 NOTE — Progress Notes (Signed)
Chaplain was rounding and saw comfort cart arriving. Chaplain visited Pt with wife at the bedside. Pt was not alert but wife received chaplain warmly. Chaplain practiced active listening and offered prayer. Prayer ws accepted. Chaplain prayed for peace and comfort and divine presence.    08/30/18 1300  Clinical Encounter Type  Visited With Patient and family together  Visit Type Critical Care  Referral From Chaplain  Spiritual Encounters  Spiritual Needs Prayer;Grief support

## 2018-08-30 NOTE — Progress Notes (Signed)
New hospice home referral received from [facility]. No bed available for transfer today. Patient information faxed to referral. Plan is to follow up with family in the morning with possible transfer to the hospice home on 10/22. Thank you Dayna Barker RN, BSN, [facility] [place], hospital Liaison [phone]

## 2018-08-30 NOTE — Clinical Social Work Note (Signed)
CSW has noted that it has been documented that patient is now comfort measures. York Spaniel MSW,LCSW [phone]

## 2018-08-30 NOTE — Progress Notes (Signed)
Patient ID: Johnny Barnett, male   DOB: October 25, 1941, 77 y.o.   MRN: [id number]  I did see this patient this morning and discussed his care with Dr. Allena Katz.  The patient is now comfort care only.  I had a long discussion this morning with the patient's daughter and there is some question as to whether he will go to hospice or remain in the hospital.  The patient himself is asleep this morning and was not aroused.  There is no air leak from the chest tube.  I have independently reviewed his chest x-ray.  There is an increase in the right-sided pleural effusion.  The tube itself on the drain 20 cc yesterday.  His lungs are quite rhonchorous bilaterally.  His heart is regular.  I did discuss his care today with Dr. Allena Katz.  We will remove his chest tube for comfort measures only.  Family is aware of his current condition.  I reviewed all the x-rays with his daughter who is a Engineer, civil (consulting) at [employer].  She had all of her questions answered.

## 2018-08-31 DIAGNOSIS — Z515 Encounter for palliative care: Secondary | ICD-10-CM

## 2018-08-31 MED ORDER — SCOPOLAMINE 1 MG/3DAYS TD PT72: 1.0000 | MEDICATED_PATCH | TRANSDERMAL | 12 refills | Status: AC

## 2018-08-31 MED ORDER — GLYCOPYRROLATE 0.2 MG/ML IJ SOLN
0.4000 mg | INTRAMUSCULAR | Status: AC
  Administered 2018-08-31: 0.4 mg via INTRAVENOUS
  Filled 2018-08-31: qty 2

## 2018-08-31 MED ORDER — GLYCOPYRROLATE 0.2 MG/ML IJ SOLN: 0.2000 mg | INTRAMUSCULAR | Status: DC | PRN

## 2018-08-31 MED ORDER — MORPHINE SULFATE (CONCENTRATE) 10 MG/0.5ML PO SOLN: 5.0000 mg | ORAL | Status: AC | PRN

## 2018-08-31 MED ORDER — LORAZEPAM 2 MG/ML PO CONC: 1.0000 mg | ORAL | 0 refills | Status: AC | PRN

## 2018-08-31 NOTE — Clinical Social Work Note (Signed)
Patient going to hosp[facility] home [facility] [place] [facility] today. Karen with [facility] has made these arrangements with family. York Spaniel MSW,LCSW [phone]

## 2018-08-31 NOTE — Progress Notes (Signed)
Patient was deep suctioned via the nasal cavity per family request. There was a moderate amount of tan thick mucus. Patients breath sounds are rhonchi and diminished.

## 2018-08-31 NOTE — Progress Notes (Signed)
Visit made to new hospice home referral. Patient seen sitting up in bed, eyes closed, but does engage when spoke to. Remains on 10 liters via nasal cannula. Wife [name] at bedside. Per chart note review patient has continued to require IV lorazepam and liquid morphine for symptom management. Palliative NP Harvest Dark in during visit. Patient continues with increased secretions, scopolamine patient in place, additional dose of robinul ordered by NP. Hospice information/education provided to [facility] and patient. Questions answered, consents signed. Plan is for patient to transfer to the hospice home via EMS today. Report called to the hospice home,  EMS notified for transport. Discharge summary faxed to referral. Signed DNR in place in discharge packet. Hospital care tem updated. Dayna Barker RN, BSN, [facility] [place], hospital liaison [phone]

## 2018-08-31 NOTE — Progress Notes (Signed)
Pt will go to hospice home with two ivs.

## 2018-08-31 NOTE — Progress Notes (Signed)
Zeb Rawl [place]  A and O x 1. No complaints of pain or nausea. no new prescriptions given. Pt's wife voiced understanding of discharge instructions with no further questions. Pt discharged via EMS.    Allergies as of 08/31/2018   No Known Allergies     Medication List    STOP taking these medications   aspirin 81 MG EC tablet   carbidopa-levodopa 25-100 MG tablet Commonly known as:  SINEMET IR   Carbidopa-Levodopa ER 25-100 MG tablet controlled release Commonly known as:  SINEMET CR   fluticasone 50 MCG/ACT nasal spray Commonly known as:  FLONASE   Melatonin 1 MG Tabs   mirtazapine 15 MG tablet Commonly known as:  REMERON   multivitamin tablet   polyethylene glycol powder powder Commonly known as:  GLYCOLAX/MIRALAX   rOPINIRole 1 MG tablet Commonly known as:  REQUIP   senna-docusate 8.6-50 MG tablet Commonly known as:  Senokot-S   triamcinolone cream 0.1 % Commonly known as:  KENALOG     TAKE these medications   acetaminophen 500 MG tablet Commonly known as:  TYLENOL Take 500 mg by mouth every 6 (six) hours as needed.   LORazepam 2 MG/ML concentrated solution Commonly known as:  ATIVAN Place 0.5 mLs (1 mg total) under the tongue every 4 (four) hours as needed for anxiety.   morphine CONCENTRATE 10 MG/0.5ML Soln concentrated solution Take 0.25 mLs (5 mg total) by mouth every 2 (two) hours as needed for moderate pain (or dyspnea).   scopolamine 1 MG/3DAYS Commonly known as:  TRANSDERM-SCOP Place 1 patch (1.5 mg total) onto the skin every 3 (three) days.       Vitals:   08/30/18 2029 08/31/18 0452  BP: (!) 152/73 (!) 156/77  Pulse: 77 77  Resp: 16 18  Temp: 98.1 F (36.7 C) (!) 97.5 F (36.4 C)  SpO2: 100% 100%    Suzzanne Cloud

## 2018-08-31 NOTE — Discharge Summary (Signed)
[facility] [place], 77 y.o., DOB [birth year], MRN [id number]. Admission date: 08/24/2018 Discharge Date 08/31/2018 Primary MD Karie Schwalbe, MD Admitting Physician Cammy Copa, MD  Admission Diagnosis  Pleural effusion [J90] Generalized weakness [R53.1] Community acquired pneumonia of right lung, unspecified part of lung [J18.9]  Discharge Diagnosis   Active Problems: Hypoxic respiratory failure due to empyema Empyema with large pleural effusion CAP (community acquired pneumonia) Hypokalemia Acute encephalopathy due to infection Parkinson's disease       Hospital Course  Johnny Barnett  is a 77 y.o. male with a known history of osteoarthritis, Parkinson's disease, hyperlipidemia.Patient presented to emergency room for productive cough associated with shortness of breath and severe generalized weakness and fatigue, going on for the past week, gradually getting worse.    Patient was noted to have pneumonia on chest x-ray further evaluation showed that he had empyema.  She was seen by cardiothoracic surgery and had a chest tube placed.  Patient continued to have significant symptoms and confusion and agitation.  Further discussions were held with his wife due to progression of his Parkinson's disease it was decided to make him comfort care.  Patient's chest tube was removed.  Family has agreed to take him to hospice home.           Consults  ct surgery  Significant Tests:  See full reports for all details     Ct Chest W Contrast  Result Date: 08/25/2018 CLINICAL DATA:  Inpatient.  Pleural effusion.  Dyspnea.  Cough. EXAM: CT CHEST WITH CONTRAST TECHNIQUE: Multidetector CT imaging of the chest was performed during intravenous contrast administration. CONTRAST:  75mL OMNIPAQUE IOHEXOL 300 MG/ML  SOLN COMPARISON:  Chest radiograph from one day prior. FINDINGS: Cardiovascular: Normal heart size. No significant pericardial  effusion/thickening. Left anterior descending, left circumflex and right coronary atherosclerosis. Atherosclerotic nonaneurysmal thoracic aorta. Normal caliber pulmonary arteries. No central pulmonary emboli. Mediastinum/Nodes: No discrete thyroid nodules. Unremarkable esophagus. No axillary adenopathy. Mildly enlarged 1.2 cm right hilar node (series 2/image 63). No pathologically enlarged mediastinal or left hilar nodes. Lungs/Pleura: No pneumothorax. Moderate to large dependent and subpulmonic right pleural effusion. Trace dependent left pleural effusion. Complete consolidation with significant volume loss in the right lower lobe. Near complete consolidation with significant volume loss in the right middle lobe. Patchy consolidation and volume loss throughout the dependent right upper lobe. Heterogeneous attenuation throughout the consolidated regions of the right upper and right middle lobes, without measurable/drainable pulmonary abscess or cavitary change. Mild dependent atelectasis in the left lower lobe. No discrete lung masses or significant pulmonary nodules. Upper abdomen: Granulomatous calcification at the liver dome. Musculoskeletal: No aggressive appearing focal osseous lesions. Moderate thoracic spondylosis. Healing lateral left ninth rib fracture. IMPRESSION: 1. Moderate to large dependent and subpulmonic right pleural effusion, which may be loculated. 2. Complete consolidation with significant volume loss in the right lower lobe. Near complete consolidation with significant volume loss in the right middle lobe. Patchy consolidation and volume loss in the dependent right upper lobe with heterogeneous attenuation. Favor a combination of multilobar pneumonia and atelectasis in the right lung. No cavitary change or measurable/drainable pulmonary abscess. No discrete lung mass. 3. Recommend post treatment follow-up chest CT with IV contrast to document resolution of these findings and to exclude  underlying lung mass. 4. Nonspecific mild right hilar adenopathy, probably reactive. 5. Three-vessel coronary atherosclerosis. 6. Healing lateral left ninth rib fracture. Aortic Atherosclerosis (ICD10-I70.0). Electronically Signed   By:  Delbert Phenix M.D.   On: 08/25/2018 12:24   Dg Chest Port 1 View  Result Date: 08/27/2018 CLINICAL DATA:  77 y/o M; cough, right-sided chest tube placement. History of hypertension and Parkinson's disease. EXAM: PORTABLE CHEST 1 VIEW COMPARISON:  08/27/2018 chest radiograph FINDINGS: Stable cardiac silhouette within normal limits given projection and technique. Calcific aortic atherosclerosis. Stable moderate right-sided pleural effusion with pleural drain in situ. No pneumothorax. Increased pulmonary markings is expiratory radiograph. Stable ill-defined basilar opacities. Multilevel degenerative changes of the spine. Bones no acute osseous abnormality is evident. IMPRESSION: Stable moderate right-sided pleural effusion with pleural drain in situ. Stable ill-defined basilar opacities which may represent associated atelectasis or pneumonia. Electronically Signed   By: Mitzi Hansen M.D.   On: 08/27/2018 22:44   Dg Chest Port 1 View  Result Date: 08/27/2018 CLINICAL DATA:  Follow-up right chest tube and pleural effusion EXAM: PORTABLE CHEST 1 VIEW COMPARISON:  08/25/2018 FINDINGS: Pigtail chest tube has been placed. No pneumothorax is identified. Persistent lateral density is noted along the right chest wall which may represent some loculated fluid. Small inferior pleural effusion is noted on the right as well. The right effusion has improved following chest tube placement. The left lung remains clear. IMPRESSION: Chest tube in place without pneumothorax. Stable densities laterally and inferiorly consistent with fluid are seen but improved from the prior exam. Electronically Signed   By: Alcide Clever M.D.   On: 08/27/2018 08:34   Dg Chest Port 1 View  Result  Date: 08/25/2018 CLINICAL DATA:  S/p thora; EXAM: PORTABLE CHEST - 1 VIEW COMPARISON:  the previous day's study FINDINGS: No definite pneumothorax. Possible skin fold projecting over the right upper lung; pulmonary markings are seen peripherally. Interval decrease in right pleural effusion with moderate residual. Improved lung aeration overall. Coarse linear opacities at the left lung base as before, without effusion. Heart size normal for technique. Aortic Atherosclerosis (ICD10-170.0). Vertebral endplate spurring at multiple levels in the mid and lower thoracic spine. IMPRESSION: Decrease in right pleural effusion post thoracentesis. Electronically Signed   By: Corlis Leak M.D.   On: 08/25/2018 16:04   Dg Chest Portable 1 View  Result Date: 08/24/2018 CLINICAL DATA:  Increased weakness for last few months significantly worsened since lunch today, history Parkinson's EXAM: PORTABLE CHEST 1 VIEW COMPARISON:  Portable exam 1935 hours compared to 08/02/2012 FINDINGS: Enlargement of cardiac silhouette. Atherosclerotic calcification aorta. Mediastinal contours and pulmonary vascularity normal. Moderate to large RIGHT pleural effusion and atelectasis of the lower RIGHT lung. Underlying infiltrate and/or tumor not excluded in this setting. LEFT lung clear. Bones demineralized. No pneumothorax. IMPRESSION: New moderate to large RIGHT pleural effusion and significant lower RIGHT lung atelectasis, cannot exclude underlying abnormalities. Electronically Signed   By: Ulyses Southward M.D.   On: 08/24/2018 20:29   Ct Image Guided Drainage By Percutaneous Catheter  Result Date: 08/26/2018 INDICATION: 77 year old male with right-sided empyema. EXAM: CT-guided chest tube placement MEDICATIONS: The patient is currently admitted to the hospital and receiving intravenous antibiotics. The antibiotics were administered within an appropriate time frame prior to the initiation of the procedure. ANESTHESIA/SEDATION: None  COMPLICATIONS: None immediate. PROCEDURE: Informed written consent was obtained from the patient after a thorough discussion of the procedural risks, benefits and alternatives. All questions were addressed. A timeout was performed prior to the initiation of the procedure. A planning axial CT scan was performed. There is a large posterior fluid collection despite thoracentesis yesterday. A suitable skin entry site was selected and  marked. The overlying skin was sterilely prepped and draped in the standard fashion using chlorhexidine skin prep. Local anesthesia was attained by infiltration with 1% lidocaine. A small dermatotomy was made. An 18 gauge trocar needle was carefully advanced into the pleural space and directed superiorly. An Amplatz wire was then advanced to the lung apex. The skin tract was dilated to 23 [place] and a Cook 12 [place] all-purpose drainage catheter modified with additional sideholes was advanced over the wire and formed. The catheter was connected to wall suction via an atrium yielding approximately 700 mL of straw-colored pleural fluid. Follow-up CT imaging demonstrates significant improvement in the volume of the pleural effusion and a well-positioned drainage catheter. The catheter was secured to the skin with 0 Prolene suture and a sterile bandage was placed. IMPRESSION: Successful placement of a 51 French right-sided pigtail thoracostomy tube modified with additional sideholes. Electronically Signed   By: [name] M.D.   On: 08/26/2018 16:59   US Thoracentesis Asp Pleural Space W/img Guide  Result Date: 08/25/2018 INDICATION: Pneumonia, shortness of breath, pleural effusion EXAM: ULTRASOUND GUIDED RIGHT THORACENTESIS MEDICATIONS: None indicated COMPLICATIONS: None immediate. PROCEDURE: An ultrasound guided thoracentesis was thoroughly discussed with the patient and questions answered. The benefits, risks, alternatives and complications were also discussed. The patient  understands and wishes to proceed with the procedure. Written consent was obtained. Ultrasound was performed to localize and mark an adequate pocket of fluid in the right chest. The area was then prepped and draped in the normal sterile fashion. 1% Lidocaine was used for local anesthesia. Under ultrasound guidance a Safe-T-Centesis catheter was introduced. Thoracentesis was performed. The catheter was removed and a dressing applied. FINDINGS: A total of approximately 1 L of clear yellow fluid was removed. Samples were sent to the laboratory as requested by the clinical team. IMPRESSION: Successful ultrasound guided right thoracentesis yielding 1 L of pleural fluid. Electronically Signed   By: Corlis Leak M.D.   On: 08/25/2018 16:05       Today   Subjective:   Johnny Barnett pt moaning in pain this morning  Objective:   Blood pressure (!) 156/77, pulse 77, temperature (!) 97.5 F (36.4 C), temperature source Oral, resp. rate 18, height 5\' 7"  (1.702 m), weight 63.3 kg, SpO2 100 %.  .  Intake/Output Summary (Last 24 hours) at 08/31/2018 1032 Last data filed at 08/31/2018 0503 Gross per 24 hour  Intake 0 ml  Output 475 ml  Net -475 ml    Exam VITAL SIGNS: Blood pressure (!) 156/77, pulse 77, temperature (!) 97.5 F (36.4 C), temperature source Oral, resp. rate 18, height 5\' 7"  (1.702 m), weight 63.3 kg, SpO2 100 %.  GENERAL:  77 y.o.-year-old patient lying in the bed with no acute distress.  EYES: Pupils equal, round, reactive to light and accommodation. No scleral icterus. Extraocular muscles intact.  HEENT: Head atraumatic, normocephalic. Oropharynx and nasopharynx clear.  NECK:  Supple, no jugular venous distention. No thyroid enlargement, no tenderness.  LUNGS: Normal breath sounds bilaterally, no wheezing, rales,rhonchi or crepitation. No use of accessory muscles of respiration.  CARDIOVASCULAR: S1, S2 normal. No murmurs, rubs, or gallops.  ABDOMEN: Soft, nontender, nondistended.  Bowel sounds present. No organomegaly or mass.  EXTREMITIES: No pedal edema, cyanosis, or clubbing.  NEUROLOGIC: Uncomfortable due to pain.  PSYCHIATRIC: Uncomfortable due to pain SKIN: No obvious rash, lesion, or ulcer.   Data Review     CBC w Diff:  Lab Results  Component Value Date  WBC 15.0 (H) 08/26/2018   HGB 10.4 (L) 08/26/2018   HGB 13.0 08/12/2012   HCT 31.8 (L) 08/26/2018   HCT 37.1 (L) 08/12/2012   PLT 309 08/26/2018   PLT 285 08/16/2012   LYMPHOPCT 17.9 08/21/2016   LYMPHOPCT 8.1 08/12/2012   MONOPCT 5.8 08/21/2016   MONOPCT 4.6 08/12/2012   EOSPCT 3.8 08/21/2016   EOSPCT 2.9 08/12/2012   BASOPCT 0.3 08/21/2016   BASOPCT 0.4 08/12/2012   CMP:  Lab Results  Component Value Date   NA 136 08/28/2018   NA 135 (L) 11/07/2014   K 3.6 08/28/2018   K 4.0 11/07/2014   CL 100 08/28/2018   CL 99 11/07/2014   CO2 28 08/28/2018   CO2 30 11/07/2014   BUN 52 (H) 08/28/2018   BUN 22 (H) 11/07/2014   CREATININE 0.93 08/28/2018   CREATININE 0.95 11/07/2014   PROT 7.4 07/01/2018   PROT 6.6 08/07/2012   ALBUMIN 3.9 07/01/2018   ALBUMIN 3.2 (L) 08/07/2012   BILITOT 0.6 07/01/2018   BILITOT 0.8 08/07/2012   ALKPHOS 54 07/01/2018   ALKPHOS 52 08/07/2012   AST 13 07/01/2018   AST 19 08/07/2012   ALT 5 07/01/2018   ALT 11 (L) 08/07/2012  .  Micro Results Recent Results (from the past 240 hour(s))  Blood culture (routine x 2)     Status: None   Collection Time: 08/24/18 11:19 PM  Result Value Ref Range Status   Specimen Description BLOOD LEFT FATTY CASTS  Final   Special Requests   Final    BOTTLES DRAWN AEROBIC AND ANAEROBIC Blood Culture results may not be optimal due to an excessive volume of blood received in culture bottles   Culture   Final    NO GROWTH 5 DAYS Performed at [facility], [address] [place], [place] [address]    Report Status 08/29/2018 FINAL  Final  Blood culture (routine x 2)     Status: None   Collection Time: 08/24/18  11:19 PM  Result Value Ref Range Status   Specimen Description BLOOD RIGHT FATTY CASTS  Final   Special Requests   Final    Blood Culture results may not be optimal due to an excessive volume of blood received in culture bottles   Culture   Final    NO GROWTH 5 DAYS Performed at [facility], [address] [place], [place] [address]    Report Status 08/29/2018 FINAL  Final  Expectorated sputum assessment w rflx to resp cult     Status: None   Collection Time: 08/25/18  1:39 PM  Result Value Ref Range Status   Specimen Description SPUTUM  Final   Special Requests Normal  Final   Sputum evaluation   Final    THIS SPECIMEN IS ACCEPTABLE FOR SPUTUM CULTURE Performed at [facility], [address], [place], [place] [address]    Report Status 08/25/2018 FINAL  Final  Culture, respiratory     Status: None   Collection Time: 08/25/18  1:39 PM  Result Value Ref Range Status   Specimen Description   Final    SPUTUM Performed at [facility], [address], [place], [place] [address]    Special Requests   Final    Normal Reflexed from [id number] Performed at [facility], [address] [place], [place] [address]    Gram Stain   Final    FEW WBC PRESENT, PREDOMINANTLY PMN ABUNDANT GRAM POSITIVE COCCI MODERATE GRAM  NEGATIVE RODS MODERATE GRAM POSITIVE RODS    Culture   Final    Consistent with normal respiratory flora. Performed at [facility], [address], [place], [place] [address]    Report Status 08/28/2018 FINAL  Final  Body fluid culture     Status: None   Collection Time: 08/25/18  3:27 PM  Result Value Ref Range Status   Specimen Description   Final    PLEURAL Performed at [facility], [address], [place], [place] [address]    Special Requests   Final    Normal Performed at [facility], [address] [place], [place] [address]    Gram Stain   Final    RARE WBC PRESENT,BOTH PMN AND  MONONUCLEAR NO ORGANISMS SEEN    Culture   Final    NO GROWTH 3 DAYS Performed at [facility], [address] [place], [place] [address]    Report Status 08/29/2018 FINAL  Final  Culture, blood (Routine X 2) w Reflex to ID Panel     Status: None (Preliminary result)   Collection Time: 08/27/18 11:15 PM  Result Value Ref Range Status   Specimen Description BLOOD LEFT ANTECUBITAL  Final   Special Requests   Final    BOTTLES DRAWN AEROBIC AND ANAEROBIC Blood Culture results may not be optimal due to an excessive volume of blood received in culture bottles   Culture   Final    NO GROWTH 4 DAYS Performed at [facility], [address] [place], [place] [address]    Report Status PENDING  Incomplete  Culture, blood (Routine X 2) w Reflex to ID Panel     Status: None (Preliminary result)   Collection Time: 08/27/18 11:15 PM  Result Value Ref Range Status   Specimen Description BLOOD LEFT HAND  Final   Special Requests   Final    BOTTLES DRAWN AEROBIC AND ANAEROBIC Blood Culture results may not be optimal due to an excessive volume of blood received in culture bottles   Culture   Final    NO GROWTH 4 DAYS Performed at [facility], [address] [place], [place] [address]    Report Status PENDING  Incomplete        Code Status Orders  (From admission, onward)         Start     Ordered   08/28/18 0906  Do not attempt resuscitation (DNR)  Continuous    Question Answer Comment  In the event of cardiac or respiratory ARREST Do not call a "code blue"   In the event of cardiac or respiratory ARREST Do not perform Intubation, CPR, defibrillation or ACLS   In the event of cardiac or respiratory ARREST Use medication by any route, position, wound care, and other measures to relive pain and suffering. May use oxygen, suction and manual treatment of airway obstruction as needed for comfort.   Comments nurse may pronounce      08/28/18 0906        Code  Status History    Date Active Date Inactive Code Status Order ID Comments User Context   08/25/2018 1334 08/28/2018 0906 DNR [id number]  Alford Highland, MD Inpatient   08/25/2018 0200 08/25/2018 1334 Full Code [id number]  Cammy Copa, MD Inpatient    Advance Directive Documentation     Most Recent Value  Type of Advance Directive  Healthcare Power of Attorney  Pre-existing out of facility DNR order (yellow  form or pink MOST form)  -  "MOST" Form in Place?  -          Contact information for after-discharge care    Destination    [facility] Preferred SNF .   Service:  Skilled Nursing Contact information: [address] [place] [address] [phone]              Discharge Medications   Allergies as of 08/31/2018   No Known Allergies     Medication List    STOP taking these medications   aspirin 81 MG EC tablet   carbidopa-levodopa 25-100 MG tablet Commonly known as:  SINEMET IR   Carbidopa-Levodopa ER 25-100 MG tablet controlled release Commonly known as:  SINEMET CR   fluticasone 50 MCG/ACT nasal spray Commonly known as:  FLONASE   Melatonin 1 MG Tabs   mirtazapine 15 MG tablet Commonly known as:  REMERON   multivitamin tablet   polyethylene glycol powder powder Commonly known as:  GLYCOLAX/MIRALAX   rOPINIRole 1 MG tablet Commonly known as:  REQUIP   senna-docusate 8.6-50 MG tablet Commonly known as:  Senokot-S   triamcinolone cream 0.1 % Commonly known as:  KENALOG     TAKE these medications   acetaminophen 500 MG tablet Commonly known as:  TYLENOL Take 500 mg by mouth every 6 (six) hours as needed.   LORazepam 2 MG/ML concentrated solution Commonly known as:  ATIVAN Place 0.5 mLs (1 mg total) under the tongue every 4 (four) hours as needed for anxiety.   morphine CONCENTRATE 10 MG/0.5ML Soln concentrated solution Take 0.25 mLs (5 mg total) by mouth every 2 (two) hours as needed for moderate pain  (or dyspnea).   scopolamine 1 MG/3DAYS Commonly known as:  TRANSDERM-SCOP Place 1 patch (1.5 mg total) onto the skin every 3 (three) days.          Total Time in preparing paper work, data evaluation and todays exam - 35 minutes  Auburn Bilberry M.D on 08/31/2018 at 10:32 AM [facility]   Office  [phone]

## 2018-08-31 NOTE — Progress Notes (Signed)
Daily Progress Note   Patient Name: Johnny Barnett       Date: 08/31/2018 DOB: Apr 05, 1941  Age: 77 y.o. MRN#: [id number] Attending Physician: Auburn Bilberry, MD Primary Care Physician: Karie Schwalbe, MD Admit Date: 08/24/2018  Reason for Consultation/Follow-up: Establishing goals of care  Subjective: Patient lethargic but responds to questions appropriately. Wife at bedside. Appears comfortable. Wife reports suctioning needed for secretions, frequent throat clearing.   Length of Stay: 6  Current Medications: Scheduled Meds:  . scopolamine  1 patch Transdermal Q72H    Continuous Infusions: . sodium chloride 10 mL/hr at 08/30/18 0429    PRN Meds: acetaminophen **OR** acetaminophen, glycopyrrolate, haloperidol **OR** haloperidol **OR** haloperidol lactate, LORazepam **OR** LORazepam **OR** LORazepam, morphine injection, morphine CONCENTRATE **OR** morphine CONCENTRATE  Physical Exam         Constitutional: He appears lethargic. No distress.  HENT:  Head: Normocephalic and atraumatic.  Mask-like expression  Cardiovascular: Normal rate and regular rhythm.  Pulmonary/Chest: Regular effort. Abdominal: Soft. Musculoskeletal: He exhibits no edema.  Neurological: He appears lethargic. Responds to questions appropriately. Skin: Skin is warm and dry. He is not diaphoretic.  Psychiatric: Cognition and memory are impaired.   Vital Signs: BP (!) 156/77 (BP Location: Left Arm)   Pulse 77   Temp (!) 97.5 F (36.4 C) (Oral)   Resp 18   Ht 5\' 7"  (1.702 m)   Wt 63.3 kg   SpO2 100%   BMI 21.86 kg/m  SpO2: SpO2: 100 % O2 Device: O2 Device: High Flow Nasal Cannula O2 Flow Rate: O2 Flow Rate (L/min): 11 L/min  Intake/output summary:   Intake/Output Summary (Last 24 hours) at 08/31/2018  1041 Last data filed at 08/31/2018 0503 Gross per 24 hour  Intake 0 ml  Output 475 ml  Net -475 ml   LBM: Last BM Date: 08/29/18 Baseline Weight: Weight: 63.5 kg Most recent weight: Weight: 63.3 kg       Palliative Assessment/Data: PPS 20%    Flowsheet Rows     Most Recent Value  Intake Tab  Referral Department  Hospitalist  Unit at Time of Referral  Med/Surg Unit  Palliative Care Primary Diagnosis  Neurology  Date Notified  08/26/18  Palliative Care Type  New Palliative care  Reason for referral  Clarify Goals of Care  Date of Admission  08/24/18  Date first seen by Palliative Care  08/26/18  # of days Palliative referral response time  0 Day(s)  # of days IP prior to Palliative referral  2  Clinical Assessment  Palliative Performance Scale Score  20%  Psychosocial & Spiritual Assessment  Palliative Care Outcomes  Patient/Family meeting held?  Yes  Who was at the meeting?  wife  Palliative Care Outcomes  Clarified goals of care, Provided psychosocial or spiritual support, Counseled regarding hospice, Provided advance care planning      Patient Active Problem List   Diagnosis Date Noted  . Pressure injury of skin 08/27/2018  . Empyema (HCC)   . Palliative care by specialist   . Goals of care, counseling/discussion   . CAP (community acquired pneumonia) 08/25/2018  . Orthostasis 07/14/2018  . Vision abnormalities 01/21/2018  . Malnutrition of moderate degree (HCC)  08/24/2017  . Advance directive discussed with patient 08/15/2015  . Actinic keratosis 12/02/2012  . Routine general medical examination at a health care facility 07/23/2012  . Other constipation 09/09/2011  . Parkinson disease (HCC) 07/07/2011  . HTN (hypertension) 05/30/2011  . Memory loss 10/16/2010  . HYPERLIPIDEMIA 05/17/2007  . ERECTILE DYSFUNCTION 05/17/2007  . DEGENERATIVE JOINT DISEASE 05/17/2007    Palliative Care Assessment & Plan   HPI: 77 y.o. male  with past medical history of  Parkinsons (diagnosed 7-8 years ago), OA, DJD, HLD, and HTN admitted on 08/24/2018 with shortness of breath and severe generalized weakness. He has been experiencing progressive weakness over the past week. He has had an unintentional weight loss of 40 pounds over the past 6 months. He was diagnosed with acute respiratory failure with hypoxia secondary to pneumonia and pleural effusion. He had 1 L removed by thoracentesis 10/16.Patient found to have empyema and planned to have CT-guided catheter placement for drainage. He is not a candidate for a thoracotomy. He has become confused during hospitalization. SLP suggested palliative care consult as patient is an aspiration risk but refuses recommended diet - thickened liquids.   Assessment: During my assessment, patient lethargic. Does not open eyes but responds appropriately to questions. Appears comfortable. Wife reports suctioning needed for secretions, frequent throat clearing. Remains on 10L O2 via [place]. Breathing appears less labored. Wife reports he has had some pain in neck - currently denies pain. Received morphine about an hour ago.  Per last discussion with wife, [name], she was no longer interested in rehab and wanted to pursue either hospice at home or hospice facility depending on how patient did over the weekend. Patient continued to decline over weekend and decision was made for full comfort care and transition to hospice facility.  Recommendations/Plan: - transition to hospice facility - continue comfort care - 0.4 mg robinul scheduled now d/t secretions, added prn dose  Code Status:  DNR  Prognosis:   < 2 weeks   Discharge Planning:  Hospice facility  Care plan was discussed with patient's wife, hospice liaison, RN  Thank you for allowing the Palliative Medicine Team to assist in the care of this patient.   Total Time 25 minutes Prolonged Time Billed  no       Greater than 50%  of this time was spent counseling and  coordinating care related to the above assessment and plan.  Gerlean Ren, DNP, [facility] Palliative Medicine Team Team Phone # [phone]  Pager [phone]

## 2018-09-01 LAB — CULTURE, BLOOD (ROUTINE X 2)
Culture: NO GROWTH
Culture: NO GROWTH

## 2018-09-10 DEATH — deceased

## 2018-10-18 ENCOUNTER — Ambulatory Visit: Payer: Medicare Other | Admitting: Podiatry

## 2018-11-09 ENCOUNTER — Encounter: Payer: Medicare Other | Admitting: Internal Medicine

## 2019-12-07 IMAGING — CT CT CHEST W/ CM
2 of 3 series · 15 of 36 positions shown, 18 images · IV contrast (omnipaque)
Comparison: Chest radiograph from one day prior.

CLINICAL DATA: Inpatient.  Pleural effusion.  Dyspnea.  Cough.

EXAM:
CT CHEST WITH CONTRAST
TECHNIQUE: Multidetector CT imaging of the chest was performed during
intravenous contrast administration.
CONTRAST:  75mL OMNIPAQUE IOHEXOL 300 MG/ML  SOLN

[Series 2: axial st · axial · 0.70mm/px · z∈[-333,-53]mm · 12 of 166 slices shown, 15 images]
[im 13/166  mediastinal]
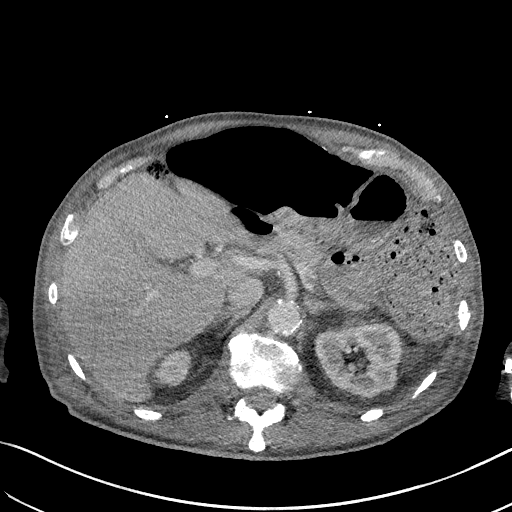
[im 13/166  lung]
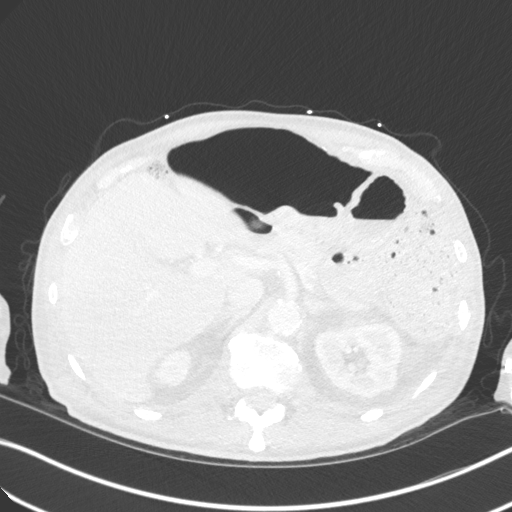
[im 25/166  lung]
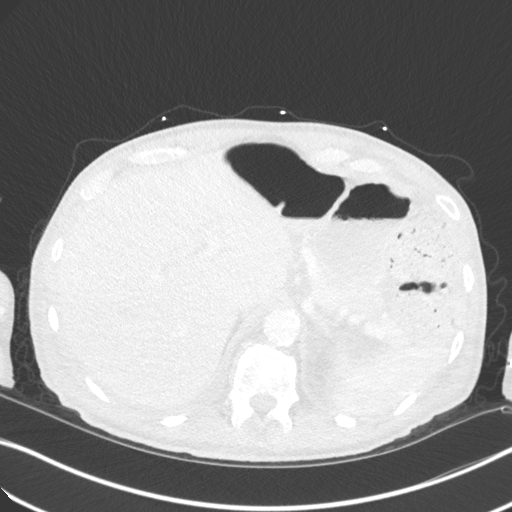
[im 37/166  lung]
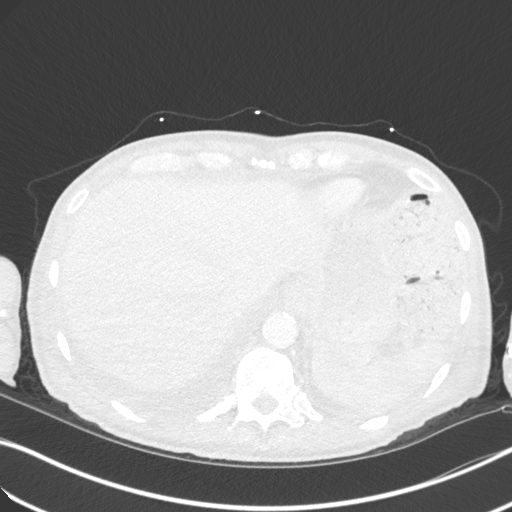
[im 49/166  lung]
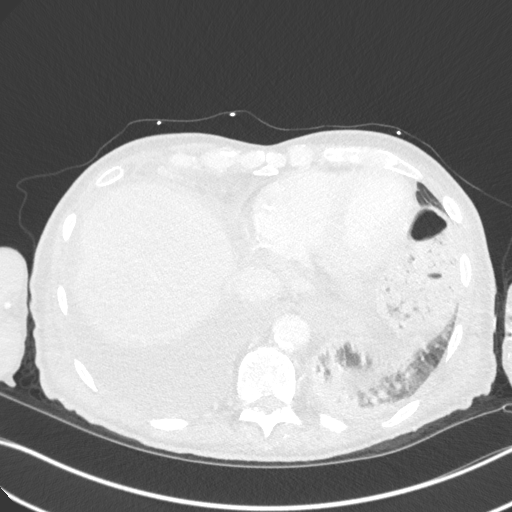
[im 62/166  mediastinal]
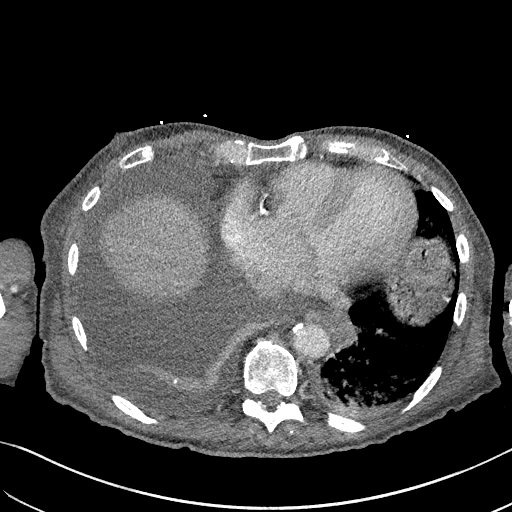
[im 62/166  lung]
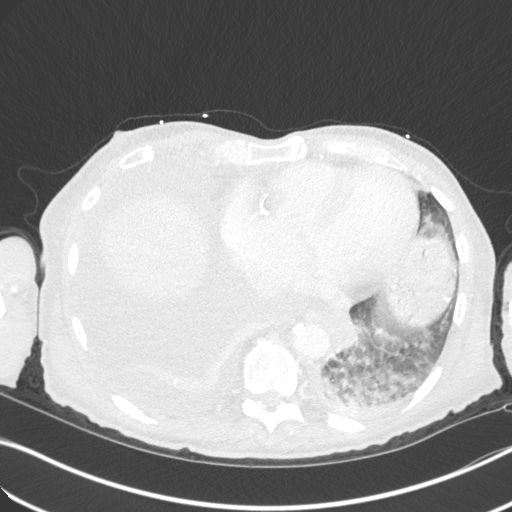
[im 74/166  lung]
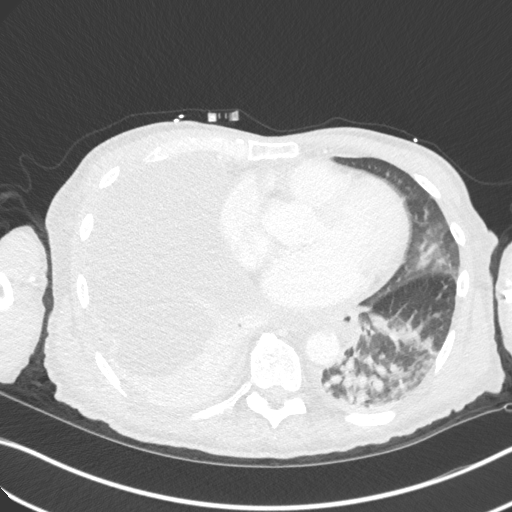
[im 92/166  lung]
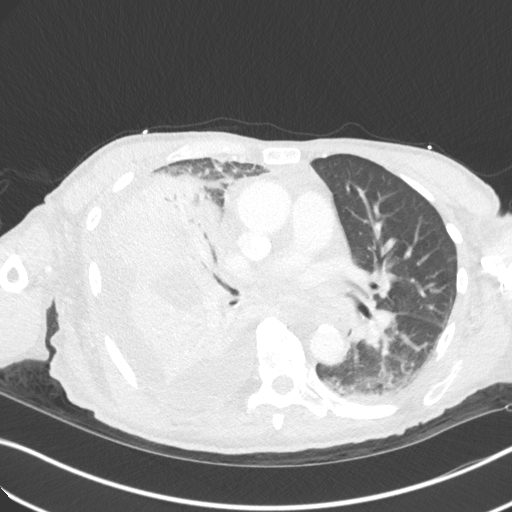
[im 104/166  lung]
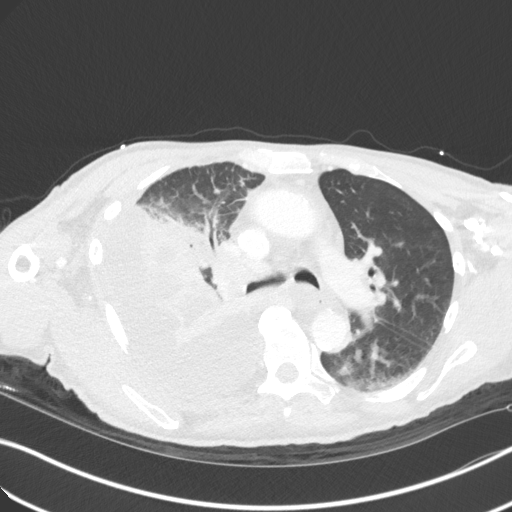
[im 117/166  mediastinal]
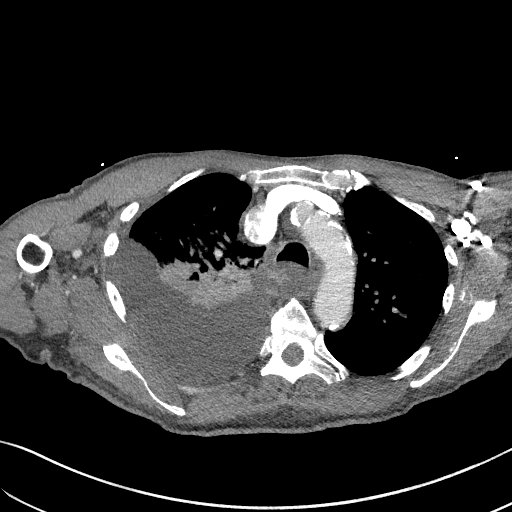
[im 117/166  lung]
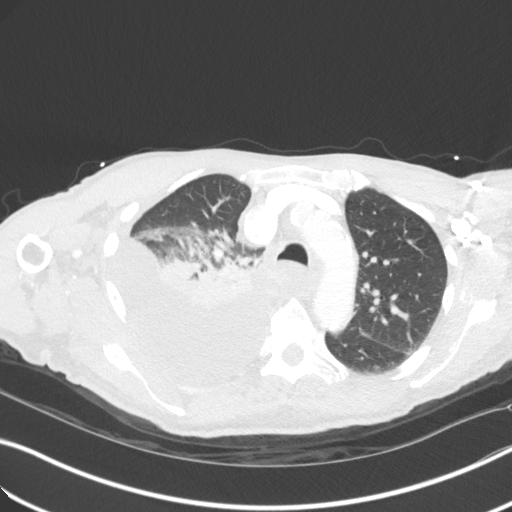
[im 129/166  lung]
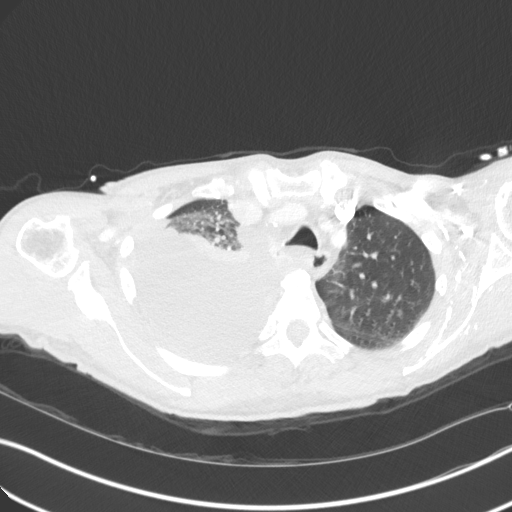
[im 141/166  lung]
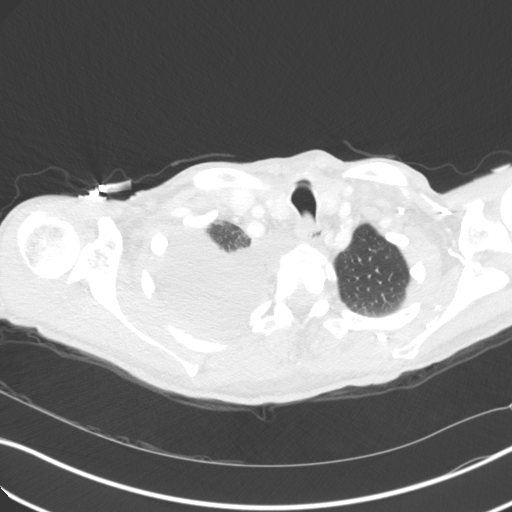
[im 153/166  lung]
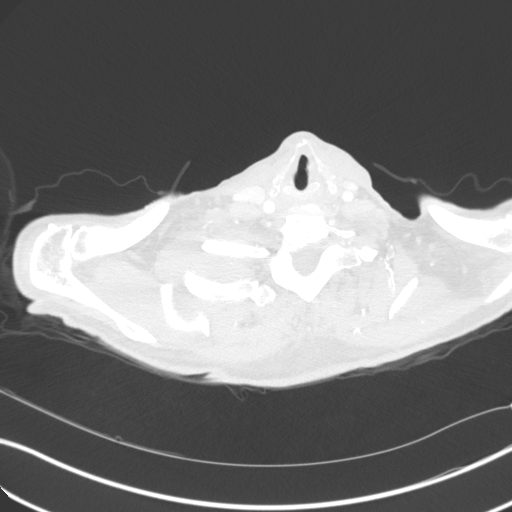

[Series 5: coronal · coronal · 0.72mm/px · 3 of 114 slices shown]
[im 23/114  lung]
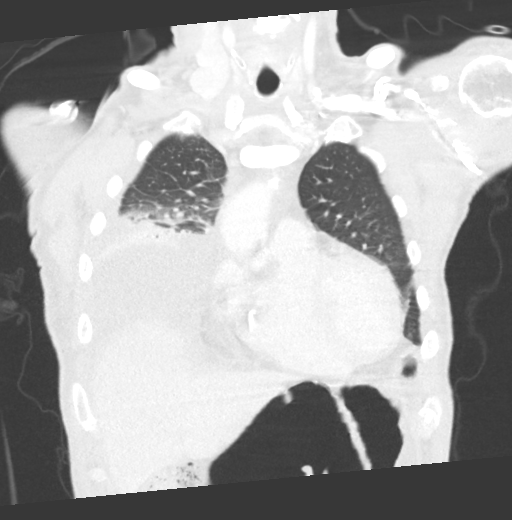
[im 46/114  lung]
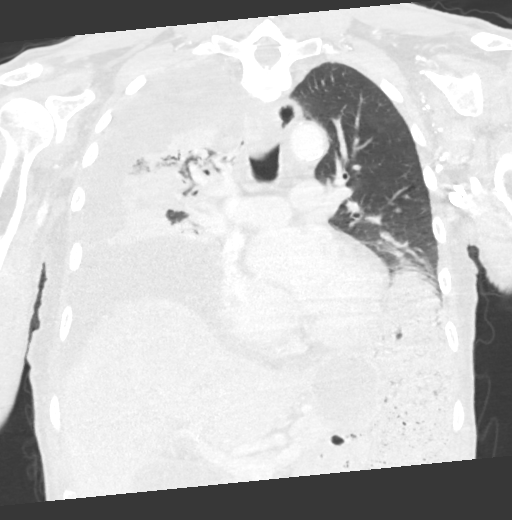
[im 68/114  lung]
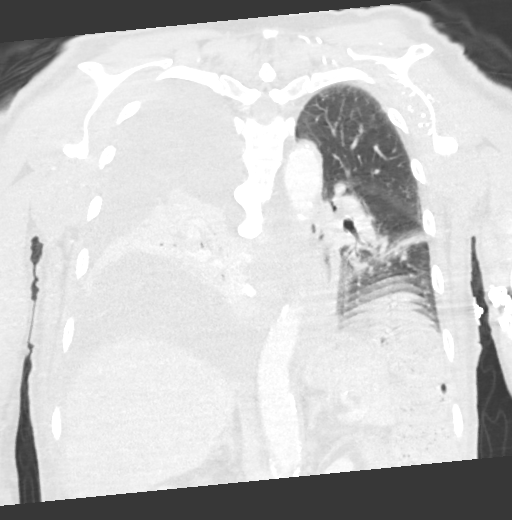

[15 of 36 positions shown; findings below may reference images not displayed]

FINDINGS: Cardiovascular: Normal heart size. No significant pericardial
effusion/thickening. Left anterior descending, left circumflex and
right coronary atherosclerosis. Atherosclerotic nonaneurysmal
thoracic aorta. Normal caliber pulmonary arteries. No central
pulmonary emboli.

Mediastinum/Nodes: No discrete thyroid nodules. Unremarkable
esophagus. No axillary adenopathy. Mildly enlarged 1.2 cm right
hilar node (series 2/image 63). No pathologically enlarged
mediastinal or left hilar nodes.

Lungs/Pleura: No pneumothorax. Moderate to large dependent and
subpulmonic right pleural effusion. Trace dependent left pleural
effusion. Complete consolidation with significant volume loss in the
right lower lobe. Near complete consolidation with significant
volume loss in the right middle lobe. Patchy consolidation and
volume loss throughout the dependent right upper lobe. Heterogeneous
attenuation throughout the consolidated regions of the right upper
and right middle lobes, without measurable/drainable pulmonary
abscess or cavitary change. Mild dependent atelectasis in the left
lower lobe. No discrete lung masses or significant pulmonary
nodules.

Upper abdomen: Granulomatous calcification at the liver dome.

Musculoskeletal: No aggressive appearing focal osseous lesions.
Moderate thoracic spondylosis. Healing lateral left ninth rib
fracture.
IMPRESSION: 1. Moderate to large dependent and subpulmonic right pleural
effusion, which may be loculated.
2. Complete consolidation with significant volume loss in the right
lower lobe. Near complete consolidation with significant volume loss
in the right middle lobe. Patchy consolidation and volume loss in
the dependent right upper lobe with heterogeneous attenuation. Favor
a combination of multilobar pneumonia and atelectasis in the right
lung. No cavitary change or measurable/drainable pulmonary abscess.
No discrete lung mass.
3. Recommend post treatment follow-up chest CT with IV contrast to
document resolution of these findings and to exclude underlying lung
mass.
4. Nonspecific mild right hilar adenopathy, probably reactive.
5. Three-vessel coronary atherosclerosis.
6. Healing lateral left ninth rib fracture.

Aortic Atherosclerosis ([id number]).
